# Patient Record
Sex: Female | Born: 1935
Health system: Southern US, Community
[De-identification: ages and names within clinical notes are randomized; demographics above are authoritative.]

## PROBLEM LIST (undated history)

## (undated) ENCOUNTER — Emergency Department (HOSPITAL_COMMUNITY): Payer: Medicare Other | Source: Home / Self Care

## (undated) DIAGNOSIS — R6510 Systemic inflammatory response syndrome (SIRS) of non-infectious origin without acute organ dysfunction: Secondary | ICD-10-CM

## (undated) DIAGNOSIS — R627 Adult failure to thrive: Secondary | ICD-10-CM

## (undated) DIAGNOSIS — R7303 Prediabetes: Secondary | ICD-10-CM

## (undated) DIAGNOSIS — E860 Dehydration: Secondary | ICD-10-CM

## (undated) DIAGNOSIS — E871 Hypo-osmolality and hyponatremia: Secondary | ICD-10-CM

## (undated) DIAGNOSIS — I10 Essential (primary) hypertension: Secondary | ICD-10-CM

## (undated) DIAGNOSIS — C4490 Unspecified malignant neoplasm of skin, unspecified: Secondary | ICD-10-CM

## (undated) DIAGNOSIS — N390 Urinary tract infection, site not specified: Secondary | ICD-10-CM

## (undated) DIAGNOSIS — J181 Lobar pneumonia, unspecified organism: Principal | ICD-10-CM

## (undated) HISTORY — DX: Urinary tract infection, site not specified: N39.0

## (undated) HISTORY — DX: Systemic inflammatory response syndrome (sirs) of non-infectious origin without acute organ dysfunction: R65.10

## (undated) HISTORY — PX: CHOLECYSTECTOMY: SHX55

## (undated) HISTORY — DX: Lobar pneumonia, unspecified organism: J18.1

## (undated) HISTORY — DX: Dehydration: E86.0

## (undated) HISTORY — DX: Hypo-osmolality and hyponatremia: E87.1

## (undated) HISTORY — DX: Adult failure to thrive: R62.7

---

## 2010-11-11 ENCOUNTER — Encounter (HOSPITAL_COMMUNITY): Payer: Self-pay

## 2010-11-11 ENCOUNTER — Emergency Department (HOSPITAL_COMMUNITY): Payer: Medicare Other

## 2010-11-11 ENCOUNTER — Emergency Department (HOSPITAL_COMMUNITY)
Admission: EM | Admit: 2010-11-11 | Discharge: 2010-11-11 | Disposition: A | Discharge status: Home / Self Care | Payer: Medicare Other | Attending: Emergency Medicine | Admitting: Emergency Medicine

## 2010-11-11 DIAGNOSIS — Z79899 Other long term (current) drug therapy: Secondary | ICD-10-CM | POA: Insufficient documentation

## 2010-11-11 DIAGNOSIS — B029 Zoster without complications: Secondary | ICD-10-CM | POA: Insufficient documentation

## 2010-11-11 DIAGNOSIS — R109 Unspecified abdominal pain: Secondary | ICD-10-CM | POA: Insufficient documentation

## 2010-11-11 DIAGNOSIS — N949 Unspecified condition associated with female genital organs and menstrual cycle: Secondary | ICD-10-CM | POA: Insufficient documentation

## 2010-11-11 DIAGNOSIS — I10 Essential (primary) hypertension: Secondary | ICD-10-CM | POA: Insufficient documentation

## 2010-11-11 DIAGNOSIS — Z9889 Other specified postprocedural states: Secondary | ICD-10-CM | POA: Insufficient documentation

## 2010-11-11 DIAGNOSIS — R079 Chest pain, unspecified: Secondary | ICD-10-CM | POA: Insufficient documentation

## 2010-11-11 DIAGNOSIS — R10819 Abdominal tenderness, unspecified site: Secondary | ICD-10-CM | POA: Insufficient documentation

## 2010-11-11 LAB — URINALYSIS, ROUTINE W REFLEX MICROSCOPIC
Urine Glucose, Fasting: NEGATIVE mg/dL
pH: 7 (ref 5.0–8.0)

## 2010-11-11 LAB — DIFFERENTIAL
Basophils Absolute: 0 10*3/uL (ref 0.0–0.1)
Basophils Relative: 1 % (ref 0–1)
Eosinophils Absolute: 0.1 10*3/uL (ref 0.0–0.7)
Eosinophils Relative: 1 % (ref 0–5)
Neutrophils Relative %: 68 % (ref 43–77)

## 2010-11-11 LAB — COMPREHENSIVE METABOLIC PANEL
AST: 46 U/L — ABNORMAL HIGH (ref 0–37)
Albumin: 4.3 g/dL (ref 3.5–5.2)
Alkaline Phosphatase: 100 U/L (ref 39–117)
BUN: 10 mg/dL (ref 6–23)
GFR calc Af Amer: >60 mL/min (ref >60)
Potassium: 3.3 mEq/L — ABNORMAL LOW (ref 3.5–5.1)
Sodium: 129 mEq/L — ABNORMAL LOW (ref 135–145)
Total Protein: 7.4 g/dL (ref 6.0–8.3)

## 2010-11-11 LAB — CBC
Platelets: 175 10*3/uL (ref 150–400)
RDW: 13 % (ref 11.5–15.5)
WBC: 5.6 10*3/uL (ref 4.0–10.5)

## 2010-11-11 LAB — URINE MICROSCOPIC-ADD ON

## 2010-11-11 MED ORDER — IOHEXOL 300 MG/ML  SOLN
100.0000 mL | Freq: Once | INTRAMUSCULAR | Status: AC | PRN
  Administered 2010-11-11: 100 mL via INTRAVENOUS

## 2016-07-06 ENCOUNTER — Other Ambulatory Visit: Payer: Self-pay | Admitting: Family Medicine

## 2016-07-06 DIAGNOSIS — R634 Abnormal weight loss: Secondary | ICD-10-CM

## 2016-07-29 ENCOUNTER — Ambulatory Visit
Admission: RE | Admit: 2016-07-29 | Discharge: 2016-07-29 | Disposition: A | Discharge status: Home / Self Care | Payer: Medicare Other | Source: Ambulatory Visit | Attending: Family Medicine | Admitting: Family Medicine

## 2016-07-29 DIAGNOSIS — R634 Abnormal weight loss: Secondary | ICD-10-CM

## 2016-08-18 ENCOUNTER — Emergency Department (HOSPITAL_COMMUNITY): Payer: Medicare Other

## 2016-08-18 ENCOUNTER — Inpatient Hospital Stay (HOSPITAL_COMMUNITY)
Admission: EM | Admit: 2016-08-18 | Discharge: 2016-08-21 | DRG: 871 | Disposition: A | Discharge status: Skilled Nursing Facility | Payer: Medicare Other | Attending: Internal Medicine | Admitting: Internal Medicine

## 2016-08-18 ENCOUNTER — Encounter (HOSPITAL_COMMUNITY): Payer: Self-pay | Admitting: *Deleted

## 2016-08-18 DIAGNOSIS — E860 Dehydration: Secondary | ICD-10-CM | POA: Diagnosis present

## 2016-08-18 DIAGNOSIS — B370 Candidal stomatitis: Secondary | ICD-10-CM | POA: Diagnosis present

## 2016-08-18 DIAGNOSIS — Z79899 Other long term (current) drug therapy: Secondary | ICD-10-CM | POA: Diagnosis not present

## 2016-08-18 DIAGNOSIS — R159 Full incontinence of feces: Secondary | ICD-10-CM | POA: Diagnosis present

## 2016-08-18 DIAGNOSIS — Z66 Do not resuscitate: Secondary | ICD-10-CM | POA: Diagnosis present

## 2016-08-18 DIAGNOSIS — E7439 Other disorders of intestinal carbohydrate absorption: Secondary | ICD-10-CM | POA: Diagnosis present

## 2016-08-18 DIAGNOSIS — R627 Adult failure to thrive: Secondary | ICD-10-CM | POA: Diagnosis present

## 2016-08-18 DIAGNOSIS — I10 Essential (primary) hypertension: Secondary | ICD-10-CM | POA: Diagnosis present

## 2016-08-18 DIAGNOSIS — R7302 Impaired glucose tolerance (oral): Secondary | ICD-10-CM

## 2016-08-18 DIAGNOSIS — R531 Weakness: Secondary | ICD-10-CM

## 2016-08-18 DIAGNOSIS — J181 Lobar pneumonia, unspecified organism: Secondary | ICD-10-CM | POA: Diagnosis present

## 2016-08-18 DIAGNOSIS — E876 Hypokalemia: Secondary | ICD-10-CM | POA: Diagnosis present

## 2016-08-18 DIAGNOSIS — R296 Repeated falls: Secondary | ICD-10-CM | POA: Diagnosis present

## 2016-08-18 DIAGNOSIS — K219 Gastro-esophageal reflux disease without esophagitis: Secondary | ICD-10-CM

## 2016-08-18 DIAGNOSIS — R634 Abnormal weight loss: Secondary | ICD-10-CM | POA: Diagnosis present

## 2016-08-18 DIAGNOSIS — W1830XA Fall on same level, unspecified, initial encounter: Secondary | ICD-10-CM | POA: Diagnosis present

## 2016-08-18 DIAGNOSIS — R946 Abnormal results of thyroid function studies: Secondary | ICD-10-CM

## 2016-08-18 DIAGNOSIS — R7303 Prediabetes: Secondary | ICD-10-CM | POA: Diagnosis present

## 2016-08-18 DIAGNOSIS — K228 Other specified diseases of esophagus: Secondary | ICD-10-CM | POA: Diagnosis present

## 2016-08-18 DIAGNOSIS — N390 Urinary tract infection, site not specified: Secondary | ICD-10-CM | POA: Diagnosis present

## 2016-08-18 DIAGNOSIS — R6510 Systemic inflammatory response syndrome (SIRS) of non-infectious origin without acute organ dysfunction: Secondary | ICD-10-CM | POA: Diagnosis present

## 2016-08-18 DIAGNOSIS — Y92129 Unspecified place in nursing home as the place of occurrence of the external cause: Secondary | ICD-10-CM | POA: Diagnosis not present

## 2016-08-18 DIAGNOSIS — A419 Sepsis, unspecified organism: Principal | ICD-10-CM

## 2016-08-18 DIAGNOSIS — R32 Unspecified urinary incontinence: Secondary | ICD-10-CM | POA: Diagnosis present

## 2016-08-18 DIAGNOSIS — E871 Hypo-osmolality and hyponatremia: Secondary | ICD-10-CM | POA: Diagnosis present

## 2016-08-18 DIAGNOSIS — D649 Anemia, unspecified: Secondary | ICD-10-CM | POA: Diagnosis present

## 2016-08-18 DIAGNOSIS — J9601 Acute respiratory failure with hypoxia: Secondary | ICD-10-CM | POA: Diagnosis present

## 2016-08-18 DIAGNOSIS — J189 Pneumonia, unspecified organism: Secondary | ICD-10-CM

## 2016-08-18 HISTORY — DX: Unspecified malignant neoplasm of skin, unspecified: C44.90

## 2016-08-18 HISTORY — DX: Essential (primary) hypertension: I10

## 2016-08-18 HISTORY — DX: Prediabetes: R73.03

## 2016-08-18 LAB — COMPREHENSIVE METABOLIC PANEL
ALBUMIN: 2.4 g/dL — AB (ref 3.5–5.0)
ALK PHOS: 127 U/L — AB (ref 38–126)
ALT: 21 U/L (ref 14–54)
ANION GAP: 9 (ref 5–15)
AST: 39 U/L (ref 15–41)
BILIRUBIN TOTAL: 0.9 mg/dL (ref 0.3–1.2)
BUN: 18 mg/dL (ref 6–20)
CALCIUM: 8.3 mg/dL — AB (ref 8.9–10.3)
CO2: 27 mmol/L (ref 22–32)
Chloride: 98 mmol/L — ABNORMAL LOW (ref 101–111)
Creatinine, Ser: 0.68 mg/dL (ref 0.44–1.00)
GFR calc Af Amer: >60 mL/min (ref >60)
GFR calc non Af Amer: >60 mL/min (ref >60)
GLUCOSE: 130 mg/dL — AB (ref 65–99)
POTASSIUM: 3.5 mmol/L (ref 3.5–5.1)
SODIUM: 134 mmol/L — AB (ref 135–145)
TOTAL PROTEIN: 6.2 g/dL — AB (ref 6.5–8.1)

## 2016-08-18 LAB — CBC WITH DIFFERENTIAL/PLATELET
BASOS ABS: 0 10*3/uL (ref 0.0–0.1)
BASOS PCT: 0 %
EOS ABS: 0 10*3/uL (ref 0.0–0.7)
Eosinophils Relative: 0 %
HEMATOCRIT: 35.1 % — AB (ref 36.0–46.0)
HEMOGLOBIN: 11 g/dL — AB (ref 12.0–15.0)
Lymphocytes Relative: 8 %
Lymphs Abs: 0.7 10*3/uL (ref 0.7–4.0)
MCH: 26 pg (ref 26.0–34.0)
MCHC: 31.3 g/dL (ref 30.0–36.0)
MCV: 83 fL (ref 78.0–100.0)
Monocytes Absolute: 0.6 10*3/uL (ref 0.1–1.0)
Monocytes Relative: 7 %
NEUTROS ABS: 7.2 10*3/uL (ref 1.7–7.7)
NEUTROS PCT: 85 %
Platelets: 229 10*3/uL (ref 150–400)
RBC: 4.23 MIL/uL (ref 3.87–5.11)
RDW: 15.2 % (ref 11.5–15.5)
WBC: 8.5 10*3/uL (ref 4.0–10.5)

## 2016-08-18 LAB — IRON AND TIBC
IRON: 7 ug/dL — AB (ref 28–170)
Saturation Ratios: 4 % — ABNORMAL LOW (ref 10.4–31.8)
TIBC: 172 ug/dL — AB (ref 250–450)
UIBC: 165 ug/dL

## 2016-08-18 LAB — URINALYSIS, ROUTINE W REFLEX MICROSCOPIC
GLUCOSE, UA: NEGATIVE mg/dL
Ketones, ur: NEGATIVE mg/dL
Nitrite: POSITIVE — AB
PH: 6 (ref 5.0–8.0)
Protein, ur: 30 mg/dL — AB
Specific Gravity, Urine: 1.022 (ref 1.005–1.030)

## 2016-08-18 LAB — CBC
HEMATOCRIT: 31.4 % — AB (ref 36.0–46.0)
HEMOGLOBIN: 10 g/dL — AB (ref 12.0–15.0)
MCH: 26.2 pg (ref 26.0–34.0)
MCHC: 31.8 g/dL (ref 30.0–36.0)
MCV: 82.2 fL (ref 78.0–100.0)
Platelets: 213 10*3/uL (ref 150–400)
RBC: 3.82 MIL/uL — ABNORMAL LOW (ref 3.87–5.11)
RDW: 15.2 % (ref 11.5–15.5)
WBC: 7.9 10*3/uL (ref 4.0–10.5)

## 2016-08-18 LAB — I-STAT TROPONIN, ED: Troponin i, poc: 0 ng/mL (ref 0.00–0.08)

## 2016-08-18 LAB — URINE MICROSCOPIC-ADD ON
RBC / HPF: NONE SEEN RBC/hpf (ref 0–5)
SQUAMOUS EPITHELIAL / LPF: NONE SEEN

## 2016-08-18 LAB — TSH: TSH: 0.282 u[IU]/mL — ABNORMAL LOW (ref 0.350–4.500)

## 2016-08-18 LAB — VITAMIN B12: Vitamin B-12: 415 pg/mL (ref 180–914)

## 2016-08-18 LAB — CREATININE, SERUM: Creatinine, Ser: 0.64 mg/dL (ref 0.44–1.00)

## 2016-08-18 LAB — FERRITIN: Ferritin: 132 ng/mL (ref 11–307)

## 2016-08-18 MED ORDER — SODIUM CHLORIDE 0.9 % IV BOLUS (SEPSIS)
500.0000 mL | Freq: Once | INTRAVENOUS | Status: AC
  Administered 2016-08-18: 500 mL via INTRAVENOUS

## 2016-08-18 MED ORDER — ADULT MULTIVITAMIN W/MINERALS CH
ORAL_TABLET | Freq: Every day | ORAL | Status: DC
  Administered 2016-08-19 – 2016-08-21 (×3): 1 via ORAL
  Filled 2016-08-18 (×6): qty 1

## 2016-08-18 MED ORDER — ONDANSETRON HCL 4 MG/2ML IJ SOLN: 4.0000 mg | Freq: Four times a day (QID) | INTRAMUSCULAR | Status: DC | PRN

## 2016-08-18 MED ORDER — AZITHROMYCIN 500 MG PO TABS
500.0000 mg | ORAL_TABLET | ORAL | Status: DC
  Administered 2016-08-19 – 2016-08-20 (×2): 500 mg via ORAL
  Filled 2016-08-18 (×3): qty 1

## 2016-08-18 MED ORDER — LOPERAMIDE HCL 2 MG PO CAPS: 2.0000 mg | ORAL_CAPSULE | Freq: Four times a day (QID) | ORAL | Status: DC | PRN

## 2016-08-18 MED ORDER — ACETAMINOPHEN 650 MG RE SUPP: 650.0000 mg | Freq: Four times a day (QID) | RECTAL | Status: DC | PRN

## 2016-08-18 MED ORDER — GOLD BOND EX POWD
1.0000 "application " | Freq: Every day | CUTANEOUS | Status: DC
  Administered 2016-08-19 – 2016-08-21 (×3): 1 via TOPICAL

## 2016-08-18 MED ORDER — ENSURE ENLIVE PO LIQD
237.0000 mL | Freq: Two times a day (BID) | ORAL | Status: DC
  Administered 2016-08-19 – 2016-08-20 (×4): 237 mL via ORAL

## 2016-08-18 MED ORDER — LEVOFLOXACIN IN D5W 500 MG/100ML IV SOLN
500.0000 mg | Freq: Once | INTRAVENOUS | Status: AC
  Administered 2016-08-18: 500 mg via INTRAVENOUS
  Filled 2016-08-18: qty 100

## 2016-08-18 MED ORDER — ACETAMINOPHEN 325 MG PO TABS
650.0000 mg | ORAL_TABLET | Freq: Once | ORAL | Status: AC
  Administered 2016-08-18: 650 mg via ORAL
  Filled 2016-08-18: qty 2

## 2016-08-18 MED ORDER — POTASSIUM CHLORIDE CRYS ER 10 MEQ PO TBCR
10.0000 meq | EXTENDED_RELEASE_TABLET | Freq: Every day | ORAL | Status: DC
  Administered 2016-08-18 – 2016-08-21 (×4): 10 meq via ORAL
  Filled 2016-08-18 (×5): qty 1

## 2016-08-18 MED ORDER — DEXTROSE 5 % IV SOLN
1.0000 g | INTRAVENOUS | Status: DC
  Administered 2016-08-18 – 2016-08-19 (×2): 1 g via INTRAVENOUS
  Filled 2016-08-18 (×3): qty 10

## 2016-08-18 MED ORDER — ONDANSETRON HCL 4 MG PO TABS: 4.0000 mg | ORAL_TABLET | Freq: Four times a day (QID) | ORAL | Status: DC | PRN

## 2016-08-18 MED ORDER — SENNOSIDES-DOCUSATE SODIUM 8.6-50 MG PO TABS: 1.0000 | ORAL_TABLET | Freq: Every evening | ORAL | Status: DC | PRN

## 2016-08-18 MED ORDER — ENOXAPARIN SODIUM 40 MG/0.4ML ~~LOC~~ SOLN
40.0000 mg | SUBCUTANEOUS | Status: DC
Start: 2016-08-18 — End: 2016-08-21
  Administered 2016-08-18 – 2016-08-20 (×3): 40 mg via SUBCUTANEOUS
  Filled 2016-08-18 (×3): qty 0.4

## 2016-08-18 MED ORDER — HYDRALAZINE HCL 25 MG PO TABS
25.0000 mg | ORAL_TABLET | Freq: Three times a day (TID) | ORAL | Status: DC
  Administered 2016-08-18 – 2016-08-19 (×2): 25 mg via ORAL
  Filled 2016-08-18 (×2): qty 1

## 2016-08-18 MED ORDER — ACETAMINOPHEN 325 MG PO TABS: 650.0000 mg | ORAL_TABLET | Freq: Four times a day (QID) | ORAL | Status: DC | PRN

## 2016-08-18 MED ORDER — SODIUM CHLORIDE 0.9 % IV SOLN
INTRAVENOUS | Status: AC
  Administered 2016-08-18: 21:00:00 via INTRAVENOUS

## 2016-08-18 NOTE — ED Triage Notes (Addendum)
Per EMS - patient from  [facility] after a witnessed fall from standing position this morning.  Patient denies LOC or hitting head.  Patient fell Monday as well and does not recall how she came to fall.  Patient has had increase in falls recently and has an appt with PCP tomorrow morning at 10 am to be evaluated for increased falls and decrease in mobility.  Patient has no complaints.  She denies dizziness or weakness prior to fall or currently.  Patient c/o being thirsty and initially refused transport, but her daughter wanted her to be brought to ER.  Vitals 152/80, HR 112, 96% on RA, CBG 154.

## 2016-08-18 NOTE — Progress Notes (Signed)
Cm consult for Pt interested in available home health and equipment. Please provide resources. Please speak with pt's daughter Emily Mills at [phone]. CM visited [facility] BUT daughter not present when Dr Clementeen Graham and Cm entered Pt states she went to get food for her and daughter had not eaten today Pt requested pain medication Cm informed ED RN, Magda Paganini Pt pointed to her dentures on bedside table and said she did not want to loose them  CM placed the container containing the pt dentures in a patient belonging bag and then on top of her clothes in another pt belonging bag to protect them. Cm informed ED RN, Mickel Baas about pt dentures and left information for daughter in pt belonging bag also for home health agencies, private duty nursing agencies choice connections and community health response program

## 2016-08-18 NOTE — Progress Notes (Signed)
   08/18/16 0000  CM Assessment  Expected Discharge [place]  In-house Referral NA  Discharge Planning Services CM Consult  [facility] Choice Durable Medical Equipment  Choice offered to / list presented to  Patient  DME Arranged Wheelchair manual  DME [place]  Status of Service Completed, signed off  Discharge Disposition Home w Home Health Services   Subjective/Objective:                  Daughter returned to the room at 1820 and Cm spoke with her after asking pt where she hurt and she informed CM "no where" Daughter states had been saying the same all day Pt given a banana by daughter  Daughter confirms pt from [facility] and had advanced home care services for HHPT that stopped on Monday August 16 2016 but interested in further services and a wheelchair States pt has a bedside commode, shower chair and walker but no w/c   Action/Plan: CM reviewed in details medicare guidelines, Choices of home health [facility]) (length of stay in home, types of [facility] staff available, coverage, primary caregiver, up to 24 hrs before services may be started) and choices of Private duty nursing (PDN-coverage, length of stay in the home types of staff available). CM reviewed availability of [facility] to assist pcp to get pt to snf (if desired disposition) from the community level. Discussed pt to be further evaluated by unit therapists (PT/OT) for recommendation of level of care and share this with attending MD and unit CM Assisted with cranberry juice  Pt IV beeping Updated ED RN Entered in wheelchair order

## 2016-08-18 NOTE — ED Notes (Signed)
Pt has a temp of 102.0 pt was given 650 mg of tylenol.

## 2016-08-18 NOTE — ED Notes (Signed)
Patient from assisted living with c/o increasing falls over past month.  Patient denies dizziness, SOB or pain.  She also denies N/V/D and fever.    Patient's lung sounds are CTAB, heart sounds, S1/S2 with +2 radial pulses, +1 dorsalis pedis pulses and bilateral +2 pitting edema. Abdomen soft and non-tender to palpation.

## 2016-08-18 NOTE — H&P (Addendum)
TRH H&P   Patient Demographics:    Emily Mills, is a 82 y.o. female  MRN: [id number]   DOB - 1931-02-09  Admit Date - 08/18/2016  Outpatient Primary MD for the patient is London Pepper, MD [facility] [place])  Referring MD: Dr. Marcie Bal  Outpatient Specialists: None  Patient coming from: Retirement home  Chief Complaint  Patient presents with  . Fall      HPI:    Emily Mills  is a 82 y.o. female, With history of hypertension and prediabetes resident of a retirement home brought to the ED for frequent falls. As per daughter at bedside patient has had at least 4 falls in the past 6 weeks at the retirement home. During one of those falls patient seems to have syncopized and does not remember the incident. No injury sustained. During these past 6 weeks patient has been noticed to be significantly declining with generalized weakness, poor by mouth intake along with urinary incontinence. Patient also had increased bilateral leg swellings formation was started on Lasix by her PCP and was increased. This morning she again fell while standing but without loss of consciousness or head injury. Patient had an appointment with her PCP for her increased falls. Patient denies any fevers, chills, headache, blurred vision, dizziness, nausea, vomiting, chest pain, shortness of breath, cough, abdominal pain, dysuria, diarrhea, pain or numbness in her extremities. Reports urinary incontinence for the past few weeks. Also has irregular bowel movements. Denies recent antibiotic use.  In the ED She was tachycardic and tachypnea. Was mildly hypertensive as well. O2 sat 90 dropping to 88% on room air. She was afebrile. Blood work showed WBC of 8.5, hemoglobin of 11. Chemistry showed sodium of 134, K of 3.5, chloride of 98, normal renal function and glucose. Chest x-ray showed left upper lobe  pneumonia versus contusion. This was followed by a CT chest without contrast which showed left upper lobe pneumonia. Head CT was unremarkable. UA suggestive of UTI. Patient given a dose of Levaquin and hospitalist admission requested to medical floor.     Review of systems:    In addition to the HPI above, No Fever-chills, No Headache, No changes with Vision or hearing, No problems swallowing food or Liquids, No Chest pain, Cough or Shortness of Breath, No Abdominal pain, No Nausea or Vomitting, Bowel movements irregular No Blood in stool or Urine, has urinary incontinence but no dysuria No new skin rashes or bruises, No new joints pains-aches,  No new weakness, tingling, numbness in any extremity, No recent weight gain or loss, No polyuria, polydypsia or polyphagia, No significant Mental Stressors.  A full 10 point Review of Systems was done, except as stated above, all other Review of Systems were negative.   With Past History of the following :    Past Medical History:  Diagnosis Date  . Hypertension   .  Pre-diabetes       Past Surgical History:  Procedure Laterality Date  . CHOLECYSTECTOMY     30 years ago      Social History:     Social History  Substance Use Topics  . Smoking status: Never Smoker  . Smokeless tobacco: Never Used  . Alcohol use No     Lives - Have retirement home  Mobility - independent earlier    Family History :   No family history of heart disease, diabetes or stroke   Home Medications:   Prior to Admission medications   Medication Sig Start Date End Date Taking? Authorizing Provider  amLODipine (NORVASC) 2.5 MG tablet Take 2.5 mg by mouth daily. 07/12/16  Yes Historical Provider, MD  furosemide (LASIX) 20 MG tablet Take 20 mg by mouth daily. 07/02/16  Yes Historical Provider, MD  loperamide (IMODIUM) 2 MG capsule Take 2 mg by mouth 4 (four) times daily as needed for diarrhea or loose stools.   Yes Historical Provider, MD    menthol-zinc oxide (GOLD BOND) powder Apply 1 application topically daily.   Yes Historical Provider, MD  Multiple Vitamins-Minerals (DAILY MULTI 50+ PO) Take 1 tablet by mouth daily.   Yes Historical Provider, MD  nystatin cream (MYCOSTATIN) Apply 1 application topically daily as needed for irritation or rash. 07/29/16  Yes Historical Provider, MD  potassium chloride (K-DUR) 10 MEQ tablet Take 10 mEq by mouth daily.   Yes Historical Provider, MD     Allergies:    No Known Allergies   Physical Exam:   Vitals  Blood pressure (!) 167/102, pulse 118, temperature 99.1 F (37.3 C), temperature source Oral, resp. rate 24, SpO2 96 %.   General: Elderly female lying in bed, appears fatigued, not in distress HEENT: No pallor, dry oral mucosa, supple neck Chest: Right basilar crackles, no rhonchi or wheeze CVS: S1 and S2 tachycardic, normal murmurs or gallop GI: Soft, nondistended, nontender, bowel sounds present Musculoskeletal: Warm, trace edema bilaterally, nontender CNS: Alert and oriented   Data Review:    CBC  Recent Labs Lab 08/18/16 1417  WBC 8.5  HGB 11.0*  HCT 35.1*  PLT 229  MCV 83.0  MCH 26.0  MCHC 31.3  RDW 15.2  LYMPHSABS 0.7  MONOABS 0.6  EOSABS 0.0  BASOSABS 0.0   ------------------------------------------------------------------------------------------------------------------  Chemistries   Recent Labs Lab 08/18/16 1417  NA 134*  K 3.5  CL 98*  CO2 27  GLUCOSE 130*  BUN 18  CREATININE 0.68  CALCIUM 8.3*  AST 39  ALT 21  ALKPHOS 127*  BILITOT 0.9   ------------------------------------------------------------------------------------------------------------------ CrCl cannot be calculated (Unknown ideal weight.). ------------------------------------------------------------------------------------------------------------------ No results for input(s): TSH, T4TOTAL, T3FREE, THYROIDAB in the last 72 hours.  Invalid input(s):  FREET3  Coagulation profile No results for input(s): INR, PROTIME in the last 168 hours. ------------------------------------------------------------------------------------------------------------------- No results for input(s): DDIMER in the last 72 hours. -------------------------------------------------------------------------------------------------------------------  Cardiac Enzymes No results for input(s): CKMB, TROPONINI, MYOGLOBIN in the last 168 hours.  Invalid input(s): CK ------------------------------------------------------------------------------------------------------------------ No results found for: BNP   ---------------------------------------------------------------------------------------------------------------  Urinalysis    Component Value Date/Time   COLORURINE AMBER (A) 08/18/2016 1417   APPEARANCEUR CLOUDY (A) 08/18/2016 1417   LABSPEC 1.022 08/18/2016 1417   PHURINE 6.0 08/18/2016 1417   GLUCOSEU NEGATIVE 08/18/2016 1417   HGBUR SMALL (A) 08/18/2016 1417   BILIRUBINUR SMALL (A) 08/18/2016 1417   KETONESUR NEGATIVE 08/18/2016 1417   PROTEINUR 30 (A) 08/18/2016 1417   UROBILINOGEN 0.2 11/11/2010 0400  NITRITE POSITIVE (A) 08/18/2016 1417   LEUKOCYTESUR LARGE (A) 08/18/2016 1417    ----------------------------------------------------------------------------------------------------------------   Imaging Results:    Dg Chest 2 View  Result Date: 08/18/2016 CLINICAL DATA:  History of a fall 2 days ago.  Chest pain. EXAM: CHEST  2 VIEW COMPARISON:  11/11/2010 FINDINGS: The cardiac silhouette, mediastinal and hilar contours are within normal limits. There is tortuosity and calcification of the thoracic aorta. Airspace consolidation noted in the left upper lobe and also in the left lower lobe. These could represent infiltrates, aspiration or pulmonary contusions. Underline emphysematous changes and pulmonary scarring. No definite pleural effusions. No  pneumothorax. The bony thorax is intact. No definite rib fractures are identified. IMPRESSION: Left upper lobe and left lower airspace opacities could reflect pneumonia/aspiration or pulmonary contusion. Followup PA and lateral chest X-ray is recommended in 3-4 weeks following trial of antibiotic therapy to ensure resolution and exclude underlying malignancy. Electronically Signed   By: Marijo Sanes M.D.   On: 08/18/2016 15:31   Ct Head Wo Contrast  Result Date: 08/18/2016 CLINICAL DATA:  Witnessed fall from standing position this morning. Increased falls and decreased mobility recently. Initial encounter. EXAM: CT HEAD WITHOUT CONTRAST TECHNIQUE: Contiguous axial images were obtained from the base of the skull through the vertex without intravenous contrast. COMPARISON:  None. FINDINGS: Brain: There is no evidence of acute cortical infarct, intracranial hemorrhage, mass, midline shift, or extra-axial fluid collection. Mild cerebral atrophy is within normal limits for age. Periventricular white matter hypodensities are nonspecific but compatible with mild chronic small vessel ischemic disease. There is a lacunar infarct along the lateral margin of the left thalamus, chronic in appearance. Vascular: Calcified atherosclerosis at the skullbase. No hyperdense vessel. Skull: Hyperostosis frontalis interna. No fracture or destructive osseous lesion. Sinuses/Orbits: Minimal anterior left ethmoid air cell mucosal thickening. Clear mastoid air cells. Visualized orbits are unremarkable. Other: None. IMPRESSION: 1. No evidence of acute intracranial abnormality. 2. Mild chronic small vessel ischemic disease. Electronically Signed   By: Logan Bores M.D.   On: 08/18/2016 16:02   Ct Chest Wo Contrast  Result Date: 08/18/2016 CLINICAL DATA:  Possible pneumonia.  Fall 2 days ago. EXAM: CT CHEST WITHOUT CONTRAST TECHNIQUE: Multidetector CT imaging of the chest was performed following the standard protocol without IV  contrast. COMPARISON:  Chest x-ray from earlier today FINDINGS: Cardiovascular: No cardiomegaly. Small anterior pericardial effusion. Extensive mitral annular calcification. Coronary atherosclerosis. Atherosclerotic calcification. Mediastinum/Nodes: Debris in the esophagus without detectable wall thickening. Negative for adenopathy. Goiter with dystrophic calcifications in the right lobe. Lungs/Pleura: Dense consolidation in the posterior segment left upper lobe correlating with chest x-ray findings. No evidence of airway debris. No pneumatocele, hemothorax, or pneumothorax. Trace left pleural effusion. Upper Abdomen: Hepatic steatosis chronic. Benign low-density lesion in the upper spleen, stable from 2012. Musculoskeletal: Spondylosis.  No acute or aggressive finding. IMPRESSION: 1. Segmental left upper lobe pneumonia. Followup PA and lateral chest X-ray is recommended 3 to 4 weeks following trial of antibiotic therapy to ensure resolution. 2. No posttraumatic finding. 3. Reflux or stasis in the esophagus. 4. Small anterior pericardial effusion. 5. Hepatic steatosis. Electronically Signed   By: Monte Fantasia M.D.   On: 08/18/2016 16:34    My personal review of EKG: Sinus tachycardia at 111, no ST-T changes   Assessment & Plan:    Active Problems:   SIRS secondary to Lobar pneumonia, unspecified organism ([place]) Admit to MedSurg. IV hydration with normal saline. Empiric IV Rocephin and azithromycin. Check blood cultures,  strep and Legionella urine antigen. Check HIV antibody. -Tylenol when necessary for fever.  UTI Follow urine culture. Continue empiric Rocephin. Likely contributing to her urinary incontinence.    Essential hypertension Blood pressure elevated. Will hold amlodipine and Lasix given bilateral leg swellings and dehydration respectively. I will place her on oral hydralazine.  Urinary incontinence Could be caused by UTI.Check renal ultrasound if symptoms persistent despite treated  with antibiotics.    Dehydration, mild Place on IV hydration. Hold Lasix    Hyponatremia Secondary to dehydration. IV fluids.     FTT (failure to thrive) in adult with frequent falls Continue hydration. Check orthostasis. Check TSH, B12 and cortisol level. PT and nutrition evaluation.     Anemia Check stool for Hemoccult, B12 and TSH.   DVT Prophylaxis Lovenox  AM Labs Ordered, also please review Full Orders  Family Communication: Admission, patients condition and plan of care including tests being ordered have been discussed with the patient and her daughter at bedside.  Code Status full code  Likely DC to  return to retirement home versus SNF  Condition fair  Consults called: None   Admission status: Inpatient    Time spent in minutes : 60   Louellen Molder M.D on 08/18/2016 at 5:47 PM  Between 7am to 7pm - Pager - [phone]. After 7pm go to [email or web address] - password [facility]  [facility] - Office  [phone]

## 2016-08-18 NOTE — Progress Notes (Signed)
Patient suffers from history of hypertension and prediabetes resident of a retirement home brought to the ED for frequent falls and generalized weakness. As per daughter at bedside patient has had at least 4 falls in the past 6 weeks at the retirement home. During one of those falls patient seems to have syncopized and does not remember the incident.which impairs their ability to perform daily activities like bathing, dressing and mobility in the home. This morning (08/18/16) she again fell while standing but without loss of consciousness or head injury A cane nor walker will not resolve  issue with performing activities of daily living. A wheelchair will allow patient to safely perform daily activities. Patient can safely propel the wheelchair in the home or has a caregiver who can provide assistance.  Accessories: elevating leg rests (ELRs), wheel locks, extensions and anti-tippers.

## 2016-08-18 NOTE — Progress Notes (Signed)
Cm notified Advanced home health coordinator and DME coordinator of need to be followed for d/c needs and request for w/c (text)

## 2016-08-18 NOTE — ED Notes (Signed)
Bed: WA06 Expected date:  Expected time:  Means of arrival:  Comments: EMS-fall 

## 2016-08-18 NOTE — ED Notes (Signed)
In and out cath performed at bedside with Beulah Gandy, EMT, assisting.

## 2016-08-18 NOTE — ED Provider Notes (Signed)
[facility] DEPT Provider Note   CSN: [id number] Arrival date & time: 08/18/16  1305     History   Chief Complaint Chief Complaint  Patient presents with  . Fall    HPI Emily Mills is a 82 y.o. female.  Patient has been weak and has had a couple falls recently. No loss of consciousness   The history is provided by the patient and a relative.  Fall  This is a recurrent problem. The current episode started 3 to 5 hours ago. The problem occurs every several days. The problem has been resolved. Pertinent negatives include no chest pain, no abdominal pain and no headaches. Nothing aggravates the symptoms. Nothing relieves the symptoms.    Past Medical History:  Diagnosis Date  . Hypertension   . Pre-diabetes     Patient Active Problem List   Diagnosis Date Noted  . Lobar pneumonia, unspecified organism ([place]) 08/18/2016    Past Surgical History:  Procedure Laterality Date  . CHOLECYSTECTOMY     30 years ago    OB History    No data available       Home Medications    Prior to Admission medications   Medication Sig Start Date End Date Taking? Authorizing Provider  amLODipine (NORVASC) 2.5 MG tablet Take 2.5 mg by mouth daily. 07/12/16  Yes Historical Provider, MD  furosemide (LASIX) 20 MG tablet Take 20 mg by mouth daily. 07/02/16  Yes Historical Provider, MD  loperamide (IMODIUM) 2 MG capsule Take 2 mg by mouth 4 (four) times daily as needed for diarrhea or loose stools.   Yes Historical Provider, MD  menthol-zinc oxide (GOLD BOND) powder Apply 1 application topically daily.   Yes Historical Provider, MD  Multiple Vitamins-Minerals (DAILY MULTI 50+ PO) Take 1 tablet by mouth daily.   Yes Historical Provider, MD  nystatin cream (MYCOSTATIN) Apply 1 application topically daily as needed for irritation or rash. 07/29/16  Yes Historical Provider, MD  potassium chloride (K-DUR) 10 MEQ tablet Take 10 mEq by mouth daily.   Yes Historical Provider, MD    Family  History No family history on file.  Social History Social History  Substance Use Topics  . Smoking status: Never Smoker  . Smokeless tobacco: Never Used  . Alcohol use No     Allergies   Patient has no known allergies.   Review of Systems Review of Systems  Constitutional: Negative for appetite change and fatigue.  HENT: Negative for congestion, ear discharge and sinus pressure.   Eyes: Negative for discharge.  Respiratory: Negative for cough.   Cardiovascular: Negative for chest pain.  Gastrointestinal: Negative for abdominal pain and diarrhea.  Genitourinary: Negative for frequency and hematuria.  Musculoskeletal: Negative for back pain.  Skin: Negative for rash.  Neurological: Negative for seizures and headaches.  Psychiatric/Behavioral: Negative for hallucinations.     Physical Exam Updated Vital Signs BP 136/76   Pulse 101   Temp 99.1 F (37.3 C) (Oral)   Resp (!) 27   SpO2 93%   Physical Exam  Constitutional: She is oriented to person, place, and time. She appears well-developed.  HENT:  Head: Normocephalic.  Eyes: Conjunctivae and EOM are normal. No scleral icterus.  Neck: Neck supple. No thyromegaly present.  Cardiovascular: Regular rhythm.  Exam reveals no gallop and no friction rub.   No murmur heard. Tachycardia  Pulmonary/Chest: No stridor. She has no wheezes. She has no rales. She exhibits no tenderness.  Abdominal: She exhibits no distension. There is no  tenderness. There is no rebound.  Musculoskeletal: Normal range of motion. She exhibits no edema.  Lymphadenopathy:    She has no cervical adenopathy.  Neurological: She is oriented to person, place, and time. She exhibits normal muscle tone. Coordination normal.  Skin: No rash noted. No erythema.  Psychiatric: She has a normal mood and affect. Her behavior is normal.     ED Treatments / Results  Labs (all labs ordered are listed, but only abnormal results are displayed) Labs Reviewed    CBC WITH DIFFERENTIAL/PLATELET - Abnormal; Notable for the following:       Result Value   Hemoglobin 11.0 (*)    HCT 35.1 (*)    All other components within normal limits  COMPREHENSIVE METABOLIC PANEL - Abnormal; Notable for the following:    Sodium 134 (*)    Chloride 98 (*)    Glucose, Bld 130 (*)    Calcium 8.3 (*)    Total Protein 6.2 (*)    Albumin 2.4 (*)    Alkaline Phosphatase 127 (*)    All other components within normal limits  URINALYSIS, ROUTINE W REFLEX MICROSCOPIC (NOT AT [facility]) - Abnormal; Notable for the following:    Color, Urine AMBER (*)    APPearance CLOUDY (*)    Hgb urine dipstick SMALL (*)    Bilirubin Urine SMALL (*)    Protein, ur 30 (*)    Nitrite POSITIVE (*)    Leukocytes, UA LARGE (*)    All other components within normal limits  URINE MICROSCOPIC-ADD ON - Abnormal; Notable for the following:    Bacteria, UA MANY (*)    All other components within normal limits  URINE CULTURE  I-STAT TROPOININ, ED    EKG  EKG Interpretation  Date/Time:  Wednesday August 18 2016 13:29:57 EST Ventricular Rate:  111 PR Interval:    QRS Duration: 84 QT Interval:  321 QTC Calculation: 437 R Axis:   61 Text Interpretation:  Sinus tachycardia Consider left atrial enlargement Confirmed by Maralyn Witherell  MD, Broadus John [phone]) on 08/18/2016 4:43:23 PM       Radiology Dg Chest 2 View  Result Date: 08/18/2016 CLINICAL DATA:  History of a fall 2 days ago.  Chest pain. EXAM: CHEST  2 VIEW COMPARISON:  11/11/2010 FINDINGS: The cardiac silhouette, mediastinal and hilar contours are within normal limits. There is tortuosity and calcification of the thoracic aorta. Airspace consolidation noted in the left upper lobe and also in the left lower lobe. These could represent infiltrates, aspiration or pulmonary contusions. Underline emphysematous changes and pulmonary scarring. No definite pleural effusions. No pneumothorax. The bony thorax is intact. No definite rib fractures are  identified. IMPRESSION: Left upper lobe and left lower airspace opacities could reflect pneumonia/aspiration or pulmonary contusion. Followup PA and lateral chest X-ray is recommended in 3-4 weeks following trial of antibiotic therapy to ensure resolution and exclude underlying malignancy. Electronically Signed   By: Marijo Sanes M.D.   On: 08/18/2016 15:31   Ct Head Wo Contrast  Result Date: 08/18/2016 CLINICAL DATA:  Witnessed fall from standing position this morning. Increased falls and decreased mobility recently. Initial encounter. EXAM: CT HEAD WITHOUT CONTRAST TECHNIQUE: Contiguous axial images were obtained from the base of the skull through the vertex without intravenous contrast. COMPARISON:  None. FINDINGS: Brain: There is no evidence of acute cortical infarct, intracranial hemorrhage, mass, midline shift, or extra-axial fluid collection. Mild cerebral atrophy is within normal limits for age. Periventricular white matter hypodensities are nonspecific but compatible  with mild chronic small vessel ischemic disease. There is a lacunar infarct along the lateral margin of the left thalamus, chronic in appearance. Vascular: Calcified atherosclerosis at the skullbase. No hyperdense vessel. Skull: Hyperostosis frontalis interna. No fracture or destructive osseous lesion. Sinuses/Orbits: Minimal anterior left ethmoid air cell mucosal thickening. Clear mastoid air cells. Visualized orbits are unremarkable. Other: None. IMPRESSION: 1. No evidence of acute intracranial abnormality. 2. Mild chronic small vessel ischemic disease. Electronically Signed   By: Logan Bores M.D.   On: 08/18/2016 16:02   Ct Chest Wo Contrast  Result Date: 08/18/2016 CLINICAL DATA:  Possible pneumonia.  Fall 2 days ago. EXAM: CT CHEST WITHOUT CONTRAST TECHNIQUE: Multidetector CT imaging of the chest was performed following the standard protocol without IV contrast. COMPARISON:  Chest x-ray from earlier today FINDINGS:  Cardiovascular: No cardiomegaly. Small anterior pericardial effusion. Extensive mitral annular calcification. Coronary atherosclerosis. Atherosclerotic calcification. Mediastinum/Nodes: Debris in the esophagus without detectable wall thickening. Negative for adenopathy. Goiter with dystrophic calcifications in the right lobe. Lungs/Pleura: Dense consolidation in the posterior segment left upper lobe correlating with chest x-ray findings. No evidence of airway debris. No pneumatocele, hemothorax, or pneumothorax. Trace left pleural effusion. Upper Abdomen: Hepatic steatosis chronic. Benign low-density lesion in the upper spleen, stable from 2012. Musculoskeletal: Spondylosis.  No acute or aggressive finding. IMPRESSION: 1. Segmental left upper lobe pneumonia. Followup PA and lateral chest X-ray is recommended 3 to 4 weeks following trial of antibiotic therapy to ensure resolution. 2. No posttraumatic finding. 3. Reflux or stasis in the esophagus. 4. Small anterior pericardial effusion. 5. Hepatic steatosis. Electronically Signed   By: Monte Fantasia M.D.   On: 08/18/2016 16:34    Procedures Procedures (including critical care time)  Medications Ordered in ED Medications  levofloxacin (LEVAQUIN) IVPB 500 mg (not administered)  sodium chloride 0.9 % bolus 500 mL (0 mLs Intravenous Stopped 08/18/16 1454)     Initial Impression / Assessment and Plan / ED Course  I have reviewed the triage vital signs and the nursing notes.  Pertinent labs & imaging results that were available during my care of the patient were reviewed by me and considered in my medical decision making (see chart for details).  Clinical Course     Patient with urinary tract infection and pneumonia. She will be admitted for IV antibiotics  Final Clinical Impressions(s) / ED Diagnoses   Final diagnoses:  None    New Prescriptions New Prescriptions   No medications on file     Milton Ferguson, MD 08/18/16 [phone]

## 2016-08-19 ENCOUNTER — Inpatient Hospital Stay (HOSPITAL_COMMUNITY): Payer: Medicare Other

## 2016-08-19 DIAGNOSIS — A419 Sepsis, unspecified organism: Principal | ICD-10-CM

## 2016-08-19 LAB — STREP PNEUMONIAE URINARY ANTIGEN: Strep Pneumo Urinary Antigen: NEGATIVE

## 2016-08-19 LAB — CBC
HCT: 31.6 % — ABNORMAL LOW (ref 36.0–46.0)
Hemoglobin: 9.8 g/dL — ABNORMAL LOW (ref 12.0–15.0)
MCH: 25.7 pg — ABNORMAL LOW (ref 26.0–34.0)
MCHC: 31 g/dL (ref 30.0–36.0)
MCV: 82.9 fL (ref 78.0–100.0)
Platelets: 191 K/uL (ref 150–400)
RBC: 3.81 MIL/uL — ABNORMAL LOW (ref 3.87–5.11)
RDW: 15.2 % (ref 11.5–15.5)
WBC: 6.3 K/uL (ref 4.0–10.5)

## 2016-08-19 LAB — BASIC METABOLIC PANEL WITH GFR
Anion gap: 6 (ref 5–15)
BUN: 12 mg/dL (ref 6–20)
CO2: 27 mmol/L (ref 22–32)
Calcium: 7.8 mg/dL — ABNORMAL LOW (ref 8.9–10.3)
Chloride: 102 mmol/L (ref 101–111)
Creatinine, Ser: 0.55 mg/dL (ref 0.44–1.00)
GFR calc Af Amer: >60 mL/min (ref >60)
GFR calc non Af Amer: >60 mL/min (ref >60)
Glucose, Bld: 147 mg/dL — ABNORMAL HIGH (ref 65–99)
Potassium: 3.4 mmol/L — ABNORMAL LOW (ref 3.5–5.1)
Sodium: 135 mmol/L (ref 135–145)

## 2016-08-19 LAB — MRSA PCR SCREENING: MRSA by PCR: NEGATIVE

## 2016-08-19 LAB — HIV ANTIBODY (ROUTINE TESTING W REFLEX): HIV Screen 4th Generation wRfx: NONREACTIVE

## 2016-08-19 MED ORDER — FAMOTIDINE 20 MG PO TABS
20.0000 mg | ORAL_TABLET | Freq: Every day | ORAL | Status: DC
  Administered 2016-08-19 – 2016-08-21 (×3): 20 mg via ORAL
  Filled 2016-08-19 (×3): qty 1

## 2016-08-19 MED ORDER — VITAMINS A & D EX OINT
TOPICAL_OINTMENT | CUTANEOUS | Status: AC
  Filled 2016-08-19: qty 5

## 2016-08-19 MED ORDER — LIP MEDEX EX OINT
TOPICAL_OINTMENT | CUTANEOUS | Status: AC
  Administered 2016-08-19: 01:00:00
  Filled 2016-08-19: qty 7

## 2016-08-19 MED ORDER — POTASSIUM CHLORIDE CRYS ER 20 MEQ PO TBCR
40.0000 meq | EXTENDED_RELEASE_TABLET | Freq: Once | ORAL | Status: AC
  Administered 2016-08-19: 40 meq via ORAL
  Filled 2016-08-19: qty 2

## 2016-08-19 NOTE — Care Management Note (Addendum)
Case Management Note  Patient Details  Name: Cherity Massenburg MRN: [id number] Date of Birth: [birth year]  Subjective/Objective:                  Fall Action/Plan: Discharge planning Expected Discharge Date:   (unknown)               Expected Discharge Plan:  [facility]  In-House Referral:  NA  Discharge planning Services  CM Consult  Post Acute Care Choice:  Durable Medical Equipment Choice offered to:  Patient  DME Arranged:  N/A DME Agency:  NA  HH Arranged:  NA HH Agency:  NA  Status of Service:  Completed, signed off  If discussed at [place] of Stay Meetings, dates discussed:    Additional Comments: CM spoke with pt, daughter Otila Kluver, MD and CSW and plan is for SNF.  Daughter has spoken with PCP and requesting [facility], [facility], or [facility] as preference; CSW aware and arranging.  CM gave pt adv Directive booklet and called Jovita Kussmaul for notary, and witness.  NO other CM needs were communicated. Dellie Catholic, RN 08/19/2016, 12:19 PM

## 2016-08-19 NOTE — Evaluation (Addendum)
Physical Therapy Evaluation Patient Details Name: Emily Mills MRN: [id number] DOB: 1931/02/26 Today's Date: 08/19/2016   History of Present Illness  Emily Mills  is a 82 y.o. female, With history of hypertension and prediabetes resident of a retirement home brought to the ED for frequent falls. As per daughter at bedside patient has had at least 4 falls in the past 6 weeks at the retirement home. no fractures found.  Clinical Impression  The patient is very pleasant and able to ambulate with  RW and 1 assist. The patient will benefit from post acute rehab as she lives alone in retirement community and daughter works, per patient. Pt admitted with above diagnosis. Pt currently with functional limitations due to the deficits listed below (see PT Problem List).  Pt will benefit from skilled PT to increase their independence and safety with mobility to allow discharge to the venue listed below.  '     Follow Up Recommendations SNF;Supervision/Assistance - 24 hour    Equipment Recommendations  None recommended by PT    Recommendations for Other Services       Precautions / Restrictions Precautions Precautions: Fall  Incontinent of Band B, use briefs or     Mobility  Bed Mobility Overal bed mobility: Needs Assistance Bed Mobility: Supine to Sit     Supine to sit: Mod assist     General bed mobility comments: assist with legs and trunk  Transfers Overall transfer level: Needs assistance Equipment used: 4-wheeled walker Transfers: Sit to/from Stand Sit to Stand: Mod assist         General transfer comment: Asist to steady after standing up with tendendency to lean backwards. gradually improved but required min assist  Ambulation/Gait Ambulation/Gait assistance: Min assist Ambulation Distance (Feet): 70 Feet Assistive device: 4-wheeled walker Gait Pattern/deviations: Step-through pattern;Trunk flexed     General Gait Details: slow speed with 4 wheeled RW.  minassist for balance. Turns around, backs up to recliner with cues.  Stairs            Wheelchair Mobility    Modified Rankin (Stroke Patients Only)       Balance Overall balance assessment: Needs assistance;History of Falls Sitting-balance support: Bilateral upper extremity supported;Feet supported Sitting balance-Leahy Scale: Poor Sitting balance - Comments: initially  patient was swaying in all directions and especially backward. Gradually improved and able to support herself at midline. Postural control: Posterior lean Standing balance support: During functional activity;Bilateral upper extremity supported Standing balance-Leahy Scale: Poor                               Pertinent Vitals/Pain Pain Assessment: No/denies pain    Home Living Family/patient expects to be discharged to:: Private residence Living Arrangements: Alone Available Help at Discharge: Family;Available PRN/intermittently Type of Home: Independent living facility Home Access: Level entry     Home Layout: One level Home Equipment: Walker - 4 wheels      Prior Function Level of Independence: Independent with assistive device(s)               Hand Dominance        Extremity/Trunk Assessment   Upper Extremity Assessment: Generalized weakness           Lower Extremity Assessment: Generalized weakness      Cervical / Trunk Assessment: Kyphotic  Communication   Communication: No difficulties  Cognition Arousal/Alertness: Awake/alert Behavior During Therapy: WFL for tasks assessed/performed Overall Cognitive Status:  Within Functional Limits for tasks assessed                      General Comments      Exercises     Assessment/Plan    PT Assessment Patient needs continued PT services  PT Problem List Decreased strength;Decreased activity tolerance;Decreased balance;Decreased mobility;Decreased coordination;Decreased knowledge of  precautions;Decreased safety awareness;Decreased knowledge of use of DME          PT Treatment Interventions DME instruction;Gait training;Functional mobility training;Therapeutic activities;Therapeutic exercise;Balance training;Patient/family education    PT Goals (Current goals can be found in the Care Plan section)  Acute Rehab PT Goals Patient Stated Goal: wants to walk PT Goal Formulation: With patient Time For Goal Achievement: 09/02/16 Potential to Achieve Goals: Good    Frequency Min 3X/week   Barriers to discharge Decreased caregiver support      Co-evaluation               End of Session Equipment Utilized During Treatment: Gait belt Activity Tolerance: Patient tolerated treatment well Patient left: in chair;with call bell/phone within reach;with chair alarm set Nurse Communication: Mobility status         Time: 1125-1150 PT Time Calculation (min) (ACUTE ONLY): 25 min   Charges:   PT Evaluation $PT Eval Low Complexity: 1 Procedure PT Treatments $Gait Training: 8-22 mins   PT G Codes:        Claretha Cooper 08/19/2016, 1:54 PM Tresa Endo PT [phone]

## 2016-08-19 NOTE — Consult Note (Signed)
[facility] Nurse wound consult note Reason for Consult: assess for pressure injury to sacrum, none found Wound type: Moisture Pressure Ulcer POA: No Measurement: erythema and maceration in the perineal area and in the area of the pubis secondary to wet pads being held in place with mesh briefs Wound [id number] Drainage (amount, consistency, odor) none Periwound:none Dressing procedure/placement/frequency: Patient resides in an assisted or independent  living facility and manages her urinary incontinence with pads/tight fitting briefs. It is likely that her change frequency is inadequate and that her personal care and hygiene less than sufficient following episodes of urinary incontinence.  We will remove the system she is currently using, implement a low air loss mattress for a few days, turn and reposition off of the supine position, and cleanse using our house incontinence cleanser followed by a moisture barrier ointment. This should resolve the problem efficiently.  Additionally, we ill provide a pressure redistribution chair cushion for her use when OOB in chair both here and following discharge.  It is wetness resistant. [facility] nursing team will not follow, but will remain available to this patient, the nursing and medical teams.  Please re-consult if needed. Thanks, Maudie Flakes, MSN, RN, [place], Arther Abbott  Pager# [phone]

## 2016-08-19 NOTE — Progress Notes (Signed)
ED CM spoke again with Larene Beach of [facility] area about pt requesting DME and discussed pt now in [place]

## 2016-08-19 NOTE — NC FL2 (Signed)
[place] LEVEL OF CARE SCREENING TOOL     IDENTIFICATION  Patient Name: Emily Mills Birthdate: 09/20/1931 Sex: female Admission Date (Current Location): 08/18/2016  [facility] and [place] Number:  Herbalist and Address:  [facility],  [place] [address] [place]      Provider Number: [id number]  Attending Physician Name and Address:  Florencia Reasons, MD  Relative Name and Phone Number:       Current Level of Care: Hospital Recommended Level of Care: [place] Prior Approval Number:    Date Approved/Denied:   PASRR Number: [id number]  Discharge Plan: SNF    Current Diagnoses: Patient Active Problem List   Diagnosis Date Noted  . Lobar pneumonia, unspecified organism ([place]) 08/18/2016  . Essential hypertension 08/18/2016  . Dehydration, mild 08/18/2016  . Hyponatremia 08/18/2016  . UTI (urinary tract infection) 08/18/2016  . FTT (failure to thrive) in adult 08/18/2016  . Frequent falls 08/18/2016  . Prediabetes 08/18/2016  . SIRS (systemic inflammatory response syndrome) ([facility]) 08/18/2016    Orientation RESPIRATION BLADDER Height & Weight     Self, Time, Situation, Place  Normal Incontinent Weight: 153 lb (69.4 kg) Height:  5\' 1"  (154.9 cm)  BEHAVIORAL SYMPTOMS/MOOD NEUROLOGICAL BOWEL NUTRITION STATUS  Other (Comment) (no behaviors)   Incontinent Diet  AMBULATORY STATUS COMMUNICATION OF NEEDS Skin   Limited Assist Verbally Normal                       Personal Care Assistance Level of Assistance  Bathing, Feeding, Dressing Bathing Assistance: Limited assistance Feeding assistance: Independent Dressing Assistance: Limited assistance     Functional Limitations Info  Sight, Hearing, Speech Sight Info: Adequate Hearing Info: Adequate Speech Info: Adequate    SPECIAL CARE FACTORS FREQUENCY  PT (By licensed PT), OT (By licensed OT)     PT Frequency: 5x wk OT Frequency: 5x wk             Contractures Contractures Info: Not present    Additional Factors Info  Code Status Code Status Info: DNR             Current Medications (08/19/2016):  This is the current hospital active medication list Current Facility-Administered Medications  Medication Dose Route Frequency Provider Last Rate Last Dose  . 0.9 %  sodium chloride infusion   Intravenous Continuous Nishant Dhungel, MD 75 mL/hr at 08/18/16 2055    . acetaminophen (TYLENOL) tablet 650 mg  650 mg Oral Q6H PRN Nishant Dhungel, MD       Or  . acetaminophen (TYLENOL) suppository 650 mg  650 mg Rectal Q6H PRN Nishant Dhungel, MD      . azithromycin (ZITHROMAX) tablet 500 mg  500 mg Oral Q24H Nishant Dhungel, MD      . cefTRIAXone (ROCEPHIN) 1 g in dextrose 5 % 50 mL IVPB  1 g Intravenous Q24H Nishant Dhungel, MD   1 g at 08/18/16 2113  . enoxaparin (LOVENOX) injection 40 mg  40 mg Subcutaneous Q24H Nishant Dhungel, MD   40 mg at 08/18/16 2246  . famotidine (PEPCID) tablet 20 mg  20 mg Oral Daily Florencia Reasons, MD   20 mg at 08/19/16 1318  . feeding supplement (ENSURE ENLIVE) (ENSURE ENLIVE) liquid 237 mL  237 mL Oral BID BM Florencia Reasons, MD   237 mL at 08/19/16 0952  . menthol-zinc oxide (GOLD BOND) powder 1 application  1 application Topical Daily Nishant Dhungel, MD      .  multivitamin with minerals tablet   Oral Daily Nishant Dhungel, MD   1 tablet at 08/19/16 0947  . ondansetron (ZOFRAN) tablet 4 mg  4 mg Oral Q6H PRN Nishant Dhungel, MD       Or  . ondansetron (ZOFRAN) injection 4 mg  4 mg Intravenous Q6H PRN Nishant Dhungel, MD      . potassium chloride (K-DUR,KLOR-CON) CR tablet 10 mEq  10 mEq Oral Daily Nishant Dhungel, MD   10 mEq at 08/19/16 0947  . senna-docusate (Senokot-S) tablet 1 tablet  1 tablet Oral QHS PRN Louellen Molder, MD         Discharge Medications: Please see discharge summary for a list of discharge medications.  Relevant Imaging Results:  Relevant Lab Results:   Additional Information SS #  [id number]  Haidinger, Randall An, LCSW

## 2016-08-19 NOTE — Progress Notes (Signed)
Initial Nutrition Assessment  DOCUMENTATION CODES:   Not applicable  INTERVENTION:   Ensure Enlive po BID, each supplement provides 350 kcal and 20 grams of protein  NUTRITION DIAGNOSIS:   Unintentional weight loss related to social / environmental circumstances, chronic illness as evidenced by per patient/family report, mild depletion of muscle mass.   GOAL:   Patient will meet greater than or equal to 90% of their needs  MONITOR:   PO intake, Supplement acceptance  REASON FOR ASSESSMENT:   Consult Assessment of nutrition requirement/status  ASSESSMENT:   82 y.o. female, With history of hypertension and prediabetes resident of a retirement home brought to the ED for frequent falls. Admitted with PNA, SIRS, UTI, FTT  Met with patient in room today. Patient reports that she is feeling much better today. Patient has good appetite today and eating 80% meals. Patient reports that she has not been eating much pta r/t the food that they are serving in her retirement community. Patient reports that her retirement community serves a lot of pork and that she is Jewish and does not eat pork so she will just eat a potato instead. Spoke to patient about getting some Ensure to keep in her room and drinking those when pork is served. Patient reports that she has lost 30lbs over the past eight months. There is no wt history in  chart so unsure is this is an accurate amount. Patient has some mild muscle loss but overall seems to be well nourished. Patient needs dentures to chew food. Daughter is supposed to be bringing by dentures later today.   Medications reviewed and include: azithromycin, ceftriaxone, lovenox, MVI, KCl   Labs reviewed: K 3.4(L), Ca 7.8(L) AlkPhos 127(H)-11/29, Alb 2.4(L)-11/29 Iron 7(L), TIBC 172(L), Ferritin 132 wnl BG-130, 147 x 24hrs  Nutrition-Focused physical exam completed. Findings are no fat depletion, mild muscle depletion, and no edema.   Diet Order:  Diet 2  gram sodium Room service appropriate? Yes; Fluid consistency: Thin  Skin:  Reviewed, no issues  Last BM:  11/28  Height:   Ht Readings from Last 1 Encounters:  08/18/16 _0  (1.549 m)    Weight:   Wt Readings from Last 1 Encounters:  08/18/16 153 lb (69.4 kg)    Ideal Body Weight:  47.7 kg  BMI:  Body mass index is 28.91 kg/m.  Estimated Nutritional Needs:   Kcal:  1200-1450kcal/day   Protein:  57-67g/day  Fluid:  >1.2L/day   EDUCATION NEEDS:   No education needs identified at this time  Koleen Distance, RD, LDN

## 2016-08-19 NOTE — Progress Notes (Signed)
Spiritual Care Note    08/19/16 1600  Clinical Encounter Type  Visited With Patient and family together  Visit Type (Advance Directives)  Advance Directives (For Healthcare)  Does Patient Have a Medical Advance Directive? Yes   Referred by case manager for assistance with Advance Directives.  Reviewed forms with Ms Casali and her daughter Otila Kluver.  Pt initialed her preferences.  Joe/AC notarized, with two volunteers as witnesses.  Copy placed in pt's paper chart for filing with HIM.  Original and three copies returned to pt.  Pt and family appreciative.  Consulted with Taylor/RN re Tina's other questions about care plan.  [place], MDiv, [facility] 24/7 pager [phone]

## 2016-08-19 NOTE — Progress Notes (Signed)
PROGRESS NOTE  Emily Mills [id number] DOB: 1931/05/23 DOA: 08/18/2016 PCP: Hulen Shouts, MD  HPI/Recap of past 24 hours:  Fever subsided Daughter in room  Assessment/Plan: Active Problems:   Lobar pneumonia, unspecified organism ([place])   Essential hypertension   Dehydration, mild   Hyponatremia   UTI (urinary tract infection)   FTT (failure to thrive) in adult   Frequent falls   Prediabetes   SIRS (systemic inflammatory response syndrome) ([place])  Sepsis presented on admission with fever 102 , sinus tachycardia heart rate 116, tachypnea rr 34, acute hypoxic respiratory failure with left upper lobe pna and uti No leukocytosis, lactic acid was not checked Blood culture /urine culture pending, sputum sample was not sent Better on ivf and iv abx  Acute hypoxic respiratory failure /Left upper lobe pna: o2 sats dropped to 88% on room air in the ED CT chest : Segmental left upper lobe pneumonia. Followup PA and lateral chest X-ray is recommended 3 to 4 weeks following trial of antibiotic therapy to ensure resolution. Better, now on room air at rest, respiration rate better Continue abx, speech eval, will need to ambulate check o2 sats,   UTI:  Cr wnl, urine culture pending, on abx  Normocytic anemia: per daughter patient has a long history of iron deficiency hgb 11-10-9.8 b12 wnl, folate, retic count pending, Anemia panel showed iron deficiency, will avoid iv iron in the setting of acute infection, will start oral iron supplement at discharge   HTN; bp stable, bp meds held in the setting of sepsis Likely able to resume at discharge  Reflux or stasis in the esophagus. Start pepcid DG esophagus pending  Impaired fasting blood sugar, check a1c  Hypokalemia/hyponatremia Lasix held since admission Na normalized with ivf, k replaced, check mag  suppressed tsh:  check free t4  erythema and maceration in the perineal area and in the area of the pubis secondary  to wet pads being held in place with mesh briefs woc input appreciated " implement a low air loss mattress for a few days, turn and reposition off of the supine position, and cleanse using our house incontinence cleanser followed by a moisture barrier ointment. This should resolve the problem efficiently." "  Additionally, we ill provide a pressure redistribution chair cushion for her use when OOB in chair both here and following discharge. It is wetness resistant"    FTT, weight loss (Body mass index is 28.91 kg/m.) , frequent falls, daughter reported patient having incontinence, progressive lower extremity weakness CT head no acute findings, will get mri lumber spine,  will check orthostatic vital signs,  Tele with sinus tachycardia, few pac's Nutrition/PT/OT, may need SNF  Code Status: DNR , confirmed with daughter  Family Communication: patient and daughter at bedside  Disposition Plan:  snf   Consultants:  none  Procedures:  none  Antibiotics:  Rocephin/zithro   Objective: BP 111/82 (BP Location: Left Arm)   Pulse 83   Temp 97.4 F (36.3 C) (Oral)   Resp 16   Ht 5\' 1"  (1.549 m)   Wt 69.4 kg (153 lb)   SpO2 99%   BMI 28.91 kg/m   Intake/Output Summary (Last 24 hours) at 08/19/16 1100 Last data filed at 08/19/16 0816  Gross per 24 hour  Intake           851.25 ml  Output              500 ml  Net  351.25 ml   Filed Weights   08/18/16 2030  Weight: 69.4 kg (153 lb)    Exam:   General:  Frail, NAD  Cardiovascular: RRR  Respiratory: CTABL  Abdomen: Soft/ND/NT, positive BS  Musculoskeletal: trace lower Edema   Neuro: slow in answering questions, slightly impaired memory, no focal weakness  Data Reviewed: Basic Metabolic Panel:  Recent Labs Lab 08/18/16 1417 08/18/16 2052 08/19/16 0425  NA 134*  --  135  K 3.5  --  3.4*  CL 98*  --  102  CO2 27  --  27  GLUCOSE 130*  --  147*  BUN 18  --  12  CREATININE 0.68 0.64 0.55    CALCIUM 8.3*  --  7.8*   Liver Function Tests:  Recent Labs Lab 08/18/16 1417  AST 39  ALT 21  ALKPHOS 127*  BILITOT 0.9  PROT 6.2*  ALBUMIN 2.4*   No results for input(s): LIPASE, AMYLASE in the last 168 hours. No results for input(s): AMMONIA in the last 168 hours. CBC:  Recent Labs Lab 08/18/16 1417 08/18/16 2052 08/19/16 0425  WBC 8.5 7.9 6.3  NEUTROABS 7.2  --   --   HGB 11.0* 10.0* 9.8*  HCT 35.1* 31.4* 31.6*  MCV 83.0 82.2 82.9  PLT 229 213 191   Cardiac Enzymes:   No results for input(s): CKTOTAL, CKMB, CKMBINDEX, TROPONINI in the last 168 hours. BNP (last 3 results) No results for input(s): BNP in the last 8760 hours.  ProBNP (last 3 results) No results for input(s): PROBNP in the last 8760 hours.  CBG: No results for input(s): GLUCAP in the last 168 hours.  No results found for this or any previous visit (from the past 240 hour(s)).   Studies: Dg Chest 2 View  Result Date: 08/18/2016 CLINICAL DATA:  History of a fall 2 days ago.  Chest pain. EXAM: CHEST  2 VIEW COMPARISON:  11/11/2010 FINDINGS: The cardiac silhouette, mediastinal and hilar contours are within normal limits. There is tortuosity and calcification of the thoracic aorta. Airspace consolidation noted in the left upper lobe and also in the left lower lobe. These could represent infiltrates, aspiration or pulmonary contusions. Underline emphysematous changes and pulmonary scarring. No definite pleural effusions. No pneumothorax. The bony thorax is intact. No definite rib fractures are identified. IMPRESSION: Left upper lobe and left lower airspace opacities could reflect pneumonia/aspiration or pulmonary contusion. Followup PA and lateral chest X-ray is recommended in 3-4 weeks following trial of antibiotic therapy to ensure resolution and exclude underlying malignancy. Electronically Signed   By: Marijo Sanes M.D.   On: 08/18/2016 15:31   Ct Head Wo Contrast  Result Date: 08/18/2016 CLINICAL  DATA:  Witnessed fall from standing position this morning. Increased falls and decreased mobility recently. Initial encounter. EXAM: CT HEAD WITHOUT CONTRAST TECHNIQUE: Contiguous axial images were obtained from the base of the skull through the vertex without intravenous contrast. COMPARISON:  None. FINDINGS: Brain: There is no evidence of acute cortical infarct, intracranial hemorrhage, mass, midline shift, or extra-axial fluid collection. Mild cerebral atrophy is within normal limits for age. Periventricular white matter hypodensities are nonspecific but compatible with mild chronic small vessel ischemic disease. There is a lacunar infarct along the lateral margin of the left thalamus, chronic in appearance. Vascular: Calcified atherosclerosis at the skullbase. No hyperdense vessel. Skull: Hyperostosis frontalis interna. No fracture or destructive osseous lesion. Sinuses/Orbits: Minimal anterior left ethmoid air cell mucosal thickening. Clear mastoid air cells. Visualized orbits are unremarkable.  Other: None. IMPRESSION: 1. No evidence of acute intracranial abnormality. 2. Mild chronic small vessel ischemic disease. Electronically Signed   By: Logan Bores M.D.   On: 08/18/2016 16:02   Ct Chest Wo Contrast  Result Date: 08/18/2016 CLINICAL DATA:  Possible pneumonia.  Fall 2 days ago. EXAM: CT CHEST WITHOUT CONTRAST TECHNIQUE: Multidetector CT imaging of the chest was performed following the standard protocol without IV contrast. COMPARISON:  Chest x-ray from earlier today FINDINGS: Cardiovascular: No cardiomegaly. Small anterior pericardial effusion. Extensive mitral annular calcification. Coronary atherosclerosis. Atherosclerotic calcification. Mediastinum/Nodes: Debris in the esophagus without detectable wall thickening. Negative for adenopathy. Goiter with dystrophic calcifications in the right lobe. Lungs/Pleura: Dense consolidation in the posterior segment left upper lobe correlating with chest x-ray  findings. No evidence of airway debris. No pneumatocele, hemothorax, or pneumothorax. Trace left pleural effusion. Upper Abdomen: Hepatic steatosis chronic. Benign low-density lesion in the upper spleen, stable from 2012. Musculoskeletal: Spondylosis.  No acute or aggressive finding. IMPRESSION: 1. Segmental left upper lobe pneumonia. Followup PA and lateral chest X-ray is recommended 3 to 4 weeks following trial of antibiotic therapy to ensure resolution. 2. No posttraumatic finding. 3. Reflux or stasis in the esophagus. 4. Small anterior pericardial effusion. 5. Hepatic steatosis. Electronically Signed   By: Monte Fantasia M.D.   On: 08/18/2016 16:34    Scheduled Meds: . azithromycin  500 mg Oral Q24H  . cefTRIAXone (ROCEPHIN)  IV  1 g Intravenous Q24H  . enoxaparin (LOVENOX) injection  40 mg Subcutaneous Q24H  . famotidine  20 mg Oral Daily  . feeding supplement (ENSURE ENLIVE)  237 mL Oral BID BM  . menthol-zinc oxide  1 application Topical Daily  . multivitamin with minerals   Oral Daily  . potassium chloride  10 mEq Oral Daily  . potassium chloride  40 mEq Oral Once    Continuous Infusions: . sodium chloride 75 mL/hr at 08/18/16 2055     Time spent: *mins  Skai Lickteig MD, PhD  [facility] Pager [phone]. If 7PM-7AM, please contact night-coverage at [email or web address], password [facility] 08/19/2016, 11:00 AM  LOS: 1 day

## 2016-08-20 ENCOUNTER — Inpatient Hospital Stay (HOSPITAL_COMMUNITY): Payer: Medicare Other

## 2016-08-20 DIAGNOSIS — N390 Urinary tract infection, site not specified: Secondary | ICD-10-CM

## 2016-08-20 DIAGNOSIS — R627 Adult failure to thrive: Secondary | ICD-10-CM

## 2016-08-20 DIAGNOSIS — E860 Dehydration: Secondary | ICD-10-CM

## 2016-08-20 DIAGNOSIS — R6510 Systemic inflammatory response syndrome (SIRS) of non-infectious origin without acute organ dysfunction: Secondary | ICD-10-CM

## 2016-08-20 DIAGNOSIS — J181 Lobar pneumonia, unspecified organism: Secondary | ICD-10-CM

## 2016-08-20 DIAGNOSIS — E871 Hypo-osmolality and hyponatremia: Secondary | ICD-10-CM

## 2016-08-20 HISTORY — DX: Adult failure to thrive: R62.7

## 2016-08-20 HISTORY — DX: Dehydration: E86.0

## 2016-08-20 HISTORY — DX: Hypo-osmolality and hyponatremia: E87.1

## 2016-08-20 HISTORY — DX: Systemic inflammatory response syndrome (sirs) of non-infectious origin without acute organ dysfunction: R65.10

## 2016-08-20 HISTORY — DX: Urinary tract infection, site not specified: N39.0

## 2016-08-20 HISTORY — DX: Lobar pneumonia, unspecified organism: J18.1

## 2016-08-20 LAB — OCCULT BLOOD X 1 CARD TO LAB, STOOL: FECAL OCCULT BLD: NEGATIVE

## 2016-08-20 LAB — FOLATE: FOLATE: 8.3 ng/mL (ref >5.9)

## 2016-08-20 LAB — T4, FREE: Free T4: 1.23 ng/dL — ABNORMAL HIGH (ref 0.61–1.12)

## 2016-08-20 LAB — CBC WITH DIFFERENTIAL/PLATELET
BASOS ABS: 0 10*3/uL (ref 0.0–0.1)
Basophils Relative: 0 %
Eosinophils Absolute: 0 10*3/uL (ref 0.0–0.7)
Eosinophils Relative: 0 %
HEMATOCRIT: 31.7 % — AB (ref 36.0–46.0)
HEMOGLOBIN: 10.1 g/dL — AB (ref 12.0–15.0)
LYMPHS PCT: 19 %
Lymphs Abs: 1.1 10*3/uL (ref 0.7–4.0)
MCH: 26 pg (ref 26.0–34.0)
MCHC: 31.9 g/dL (ref 30.0–36.0)
MCV: 81.5 fL (ref 78.0–100.0)
Monocytes Absolute: 0.5 10*3/uL (ref 0.1–1.0)
Monocytes Relative: 9 %
NEUTROS ABS: 4.1 10*3/uL (ref 1.7–7.7)
NEUTROS PCT: 72 %
Platelets: 238 10*3/uL (ref 150–400)
RBC: 3.89 MIL/uL (ref 3.87–5.11)
RDW: 15.2 % (ref 11.5–15.5)
WBC: 5.7 10*3/uL (ref 4.0–10.5)

## 2016-08-20 LAB — EXPECTORATED SPUTUM ASSESSMENT W GRAM STAIN, RFLX TO RESP C

## 2016-08-20 LAB — BASIC METABOLIC PANEL
ANION GAP: 7 (ref 5–15)
BUN: 11 mg/dL (ref 6–20)
CO2: 24 mmol/L (ref 22–32)
Calcium: 7.6 mg/dL — ABNORMAL LOW (ref 8.9–10.3)
Chloride: 100 mmol/L — ABNORMAL LOW (ref 101–111)
Creatinine, Ser: 0.42 mg/dL — ABNORMAL LOW (ref 0.44–1.00)
GFR calc non Af Amer: >60 mL/min (ref >60)
GLUCOSE: 117 mg/dL — AB (ref 65–99)
POTASSIUM: 3.9 mmol/L (ref 3.5–5.1)
Sodium: 131 mmol/L — ABNORMAL LOW (ref 135–145)

## 2016-08-20 LAB — RETICULOCYTES
RBC.: 3.89 MIL/uL (ref 3.87–5.11)
RETIC CT PCT: 1.3 % (ref 0.4–3.1)
Retic Count, Absolute: 50.6 10*3/uL (ref 19.0–186.0)

## 2016-08-20 LAB — MAGNESIUM: Magnesium: 1.8 mg/dL (ref 1.7–2.4)

## 2016-08-20 LAB — PROTIME-INR
INR: 1.11
PROTHROMBIN TIME: 14.4 s (ref 11.4–15.2)

## 2016-08-20 LAB — EXPECTORATED SPUTUM ASSESSMENT W REFEX TO RESP CULTURE

## 2016-08-20 MED ORDER — MAGNESIUM SULFATE 2 GM/50ML IV SOLN
2.0000 g | Freq: Once | INTRAVENOUS | Status: AC
  Administered 2016-08-20: 2 g via INTRAVENOUS
  Filled 2016-08-20: qty 50

## 2016-08-20 MED ORDER — CEFTRIAXONE SODIUM 1 G IJ SOLR
1.0000 g | Freq: Once | INTRAMUSCULAR | Status: AC
  Administered 2016-08-20: 1 g via INTRAMUSCULAR
  Filled 2016-08-20: qty 10

## 2016-08-20 MED ORDER — FLUCONAZOLE IN SODIUM CHLORIDE 200-0.9 MG/100ML-% IV SOLN
200.0000 mg | Freq: Once | INTRAVENOUS | Status: AC
  Administered 2016-08-20: 200 mg via INTRAVENOUS
  Filled 2016-08-20: qty 100

## 2016-08-20 MED ORDER — METOPROLOL TARTRATE 12.5 MG HALF TABLET
12.5000 mg | ORAL_TABLET | Freq: Two times a day (BID) | ORAL | Status: DC
  Administered 2016-08-20 – 2016-08-21 (×3): 12.5 mg via ORAL
  Filled 2016-08-20 (×3): qty 1

## 2016-08-20 MED ORDER — GUAIFENESIN ER 600 MG PO TB12
600.0000 mg | ORAL_TABLET | Freq: Two times a day (BID) | ORAL | Status: DC
  Administered 2016-08-20 – 2016-08-21 (×3): 600 mg via ORAL
  Filled 2016-08-20 (×3): qty 1

## 2016-08-20 MED ORDER — NYSTATIN 100000 UNIT/ML MT SUSP
5.0000 mL | Freq: Four times a day (QID) | OROMUCOSAL | Status: DC
  Administered 2016-08-20 – 2016-08-21 (×4): 500000 [IU] via ORAL
  Filled 2016-08-20 (×4): qty 5

## 2016-08-20 MED ORDER — SODIUM CHLORIDE 0.9 % IV SOLN
INTRAVENOUS | Status: AC
  Administered 2016-08-20: 75 mL/h via INTRAVENOUS

## 2016-08-20 MED ORDER — FLUTICASONE PROPIONATE 50 MCG/ACT NA SUSP
1.0000 | Freq: Every day | NASAL | Status: DC
  Administered 2016-08-20 – 2016-08-21 (×2): 1 via NASAL
  Filled 2016-08-20: qty 16

## 2016-08-20 NOTE — Evaluation (Signed)
Clinical/Bedside Swallow Evaluation Patient Details  Name: Emily Mills MRN: [id number] Date of Birth: 04-24-1931  Today's Date: 08/20/2016 Time: SLP Start Time (ACUTE ONLY): 0810 SLP Stop Time (ACUTE ONLY): 0835 SLP Time Calculation (min) (ACUTE ONLY): 25 min  Past Medical History:  Past Medical History:  Diagnosis Date  . Hypertension   . Pre-diabetes   . Skin cancer    Past Surgical History:  Past Surgical History:  Procedure Laterality Date  . CHOLECYSTECTOMY     30 years ago   HPI:  82 yo female adm to [facility] after fall, diagnosed with left lower lobe pna.  Per chest CT pt with reflux or stasis in esophagus on CT.  Esophagram 11/30 showed presbyesophagus, tertiary contractions.  PMH + for FTT, UTI, frequent falls.  BSE ordered   Assessment / Plan / Recommendation Clinical Impression  Pt presents with functional oropharyngeal swallow based on clinical evaluation.   Pt does appear to have whitish patches consistent with oral candidiasis- RN informed.    Pt denies dysphagia, SLP reviewed esophagram with pt and provided compensation strategies for xerostomia *pt reports 2nd to medications* and esophageal mitigation strategies.    Pt observed to eat slowly with good tolerance.  Advised her to consume several small meals and frequent liquid intake.  No SlP follow up indicated.      Aspiration Risk  Mild aspiration risk (due to esophageal dysmotility)    Diet Recommendation Regular;Thin liquid   Liquid Administration via: Cup;Straw Medication Administration: Whole meds with liquid Supervision: Patient able to self feed Compensations: Minimize environmental distractions;Slow rate;Small sips/bites;Other (Comment) (drink liquids t/o meal) Postural Changes: Seated upright at 90 degrees;Remain upright for at least 30 minutes after po intake    Other  Recommendations Oral Care Recommendations: Oral care BID   Follow up Recommendations None      Frequency and Duration            Prognosis        Swallow Study   General Date of Onset: 08/20/16 HPI: 82 yo female adm to [facility] after fall, diagnosed with left lower lobe pna.  Per chest CT pt with reflux or stasis in esophagus on CT.  Esophagram 11/30 showed presbyesophagus, tertiary contractions.  PMH + for FTT, UTI, frequent falls.  BSE ordered Type of Study: Bedside Swallow Evaluation Diet Prior to this Study: Regular;Thin liquids Temperature Spikes Noted: No Respiratory Status: Room air History of Recent Intubation: No Behavior/Cognition: Alert;Cooperative;Pleasant mood Oral Cavity Assessment: Other (comment) (whitish coating in mouth that appears consistent with oral candidiasis ) Oral Care Completed by SLP:  (pt declined to brush her gums or tongue) Oral Cavity - Dentition: Dentures, top;Dentures, bottom Vision: Functional for self-feeding Self-Feeding Abilities: Able to feed self Patient Positioning: Upright in bed Baseline Vocal Quality: Normal Volitional Cough: Strong Volitional Swallow: Able to elicit    Oral/Motor/Sensory Function Overall Oral Motor/Sensory Function: Within functional limits   Ice Chips Ice chips: Not tested   Thin Liquid Thin Liquid: Within functional limits Presentation: Straw    Nectar Thick Nectar Thick Liquid: Not tested   Honey Thick Honey Thick Liquid: Not tested   Puree Puree: Within functional limits Presentation: Self Fed;Spoon   Solid   GO   Solid: Within functional limits Presentation: [place] [facility] SLP [phone]

## 2016-08-20 NOTE — Clinical Social Work Placement (Signed)
   CLINICAL SOCIAL WORK PLACEMENT  NOTE  Date:  08/20/2016  Patient Details  Name: Emily Mills MRN: [id number] Date of Birth: [birth year]  Clinical Social Work is seeking post-discharge placement for this patient at the [place] level of care (*CSW will initial, date and re-position this form in  chart as items are completed):  Yes   Patient/family provided with [place] Work Department's list of facilities offering this level of care within the geographic area requested by the patient (or if unable, by the patient's family).  Yes   Patient/family informed of their freedom to choose among providers that offer the needed level of care, that participate in Medicare, Medicaid or managed care program needed by the patient, have an available bed and are willing to accept the patient.  Yes   Patient/family informed of [facility]'s ownership interest in [facility] and [facility], as well as of the fact that they are under no obligation to receive care at these facilities.  PASRR submitted to EDS on 08/12/16     PASRR number received on 08/19/16     Existing PASRR number confirmed on       FL2 transmitted to all facilities in geographic area requested by pt/family on 08/19/16     FL2 transmitted to all facilities within larger geographic area on       Patient informed that his/her managed care company has contracts with or will negotiate with certain facilities, including the following:        No   Patient/family informed of bed offers received.  Patient chooses bed at [facility]     Physician recommends and patient chooses bed at      Patient to be transferred to [facility] on  .  Patient to be transferred to facility by       Patient family notified on   of transfer.  Name of family member notified:        PHYSICIAN       Additional Comment:    _______________________________________________ Luretha Rued, Marlinda Mike   [phone] 08/20/2016, 12:29 PM

## 2016-08-20 NOTE — Progress Notes (Signed)
Pt's IV infiltrated. Upon removing IV pt stated she refuses to get another IV. Pt due to be D/C'd to SNF tomorrow. Pt drinking adequate fluids without prompting. Paged MD about an IV abx that was due at 2100.  MD ordered for abx to be given IM. Pt in agreement with plan. Abx given  In right ventrogluteal. Pt tolerated with minimal soreness that subsided after a couple of minutes.  Will continue to monitor.  Emily Mills

## 2016-08-20 NOTE — Clinical Social Work Note (Signed)
Clinical Social Work Assessment  Patient Details  Name: Emily Mills MRN: [id number] Date of Birth: 1931/05/12  Date of referral:  08/20/16               Reason for consult:  Discharge Planning                Permission sought to share information with:  Chartered certified accountant granted to share information::  Yes, Verbal Permission Granted  Name::        Agency::     Relationship::     Contact Information:     Housing/Transportation Living arrangements for the past 2 months:  Single Family Home Source of Information:  Patient, Adult Children Patient Interpreter Needed:  None Criminal Activity/Legal Involvement Pertinent to Current Situation/Hospitalization:  No - Comment as needed Significant Relationships:  Adult Children Lives with:    Do you feel safe going back to the place where you live?  No Need for family participation in patient care:  Yes (Comment)  Care giving concerns:  Pt's care cannot be managed at home following hospital d/c.   Social Worker assessment / plan:  Pt hospitalized from [employer] with SIRS. CSW met with pt / spoke with daughter Emily Mills [phone] to assist with d/c planning. PT has recommended [facility] Rehab at d/c. Pt / daughter are in agreement with this plan. SNF search initiated and bed offers provided. Pt / daughter have chosen [facility]. SNF is able to admit pt this weekend if stable for d/c. CSW will continue to follow to assist with d/c planning needs.  Employment status:  Retired Nurse, adult PT Recommendations:  [place] / Referral to community resources:     Patient/Family's Response to care:  Pt/daughter are in agreement with ST Rehab placement.  Patient/Family's Understanding of and Emotional Response to Diagnosis, Current Treatment, and Prognosis:  Pt / daughter are aware of pt's medical status. " I feel weak. I think I  need help." Support / reassurance provided. Pt / daughter appreciate CSW assistance with d/c planning.  Emotional Assessment Appearance:  Appears stated age Attitude/Demeanor/Rapport:  Other (cooperative) Affect (typically observed):  Pleasant, Calm, Appropriate Orientation:  Oriented to Self, Oriented to Place, Oriented to  Time, Oriented to Situation Alcohol / Substance use:  Not Applicable Psych involvement (Current and /or in the community):  No (Comment)  Discharge Needs  Concerns to be addressed:  Discharge Planning Concerns Readmission within the last 30 days:  No Current discharge risk:  None Barriers to Discharge:  No Barriers Identified   Loreda Silverio, Randall An, LCSW 08/20/2016, 12:21 PM

## 2016-08-20 NOTE — Progress Notes (Addendum)
PROGRESS NOTE  Emily Mills [id number] DOB: Dec 30, 1930 DOA: 08/18/2016 PCP: Hulen Shouts, MD  HPI/Recap of past 24 hours:  No fever last 24hs, looks a lot brighter and pleasant, She report urinary and bowel incontinence, denies abdominal pain   Assessment/Plan: Active Problems:   Lobar pneumonia, unspecified organism ([place])   Essential hypertension   Dehydration, mild   Hyponatremia   UTI (urinary tract infection)   FTT (failure to thrive) in adult   Frequent falls   Prediabetes   SIRS (systemic inflammatory response syndrome) ([place])  Sepsis presented on admission with fever 102 , sinus tachycardia heart rate 116, tachypnea rr 34, acute hypoxic respiratory failure with left upper lobe pna and uti No leukocytosis, lactic acid was not checked Blood culture /urine culture +ecoli, sputum sample was not sent Improving on ivf and iv abx, fever resolved. She did has slight confusion initial on presentation this has much improved on 12/1.  Acute hypoxic respiratory failure /Left upper lobe pna: o2 sats dropped to 88% on room air in the ED CT chest : Segmental left upper lobe pneumonia. Followup PA and lateral chest X-ray is recommended 3 to 4 weeks following trial of antibiotic therapy to ensure resolution. Better, now on room air at rest, respiration rate better Continue rocephin/zithro, speech eval with mild aspiration risk due to esophageal dysmotilit,  will need to ambulate check o2 sats,   UTI:  Cr wnl, urine culture + pan sensitive ecoli, on abx  Normocytic anemia: per daughter patient has a long history of iron deficiency hgb 11-10-9.8 b12 wnl, folate, retic count pending, Anemia panel showed iron deficiency, will avoid iv iron in the setting of acute infection, will start oral iron supplement at discharge   HTN; bp stable, bp meds held in the setting of sepsis bp start to trend up with slight sinus tachycardia, will start low dose  lopressor  Presbyesophagus DG esophagus  "1. Presbyesophagus. Decreased esophageal motility and frequent tertiary contractions. 2. A 12.5 mm barium tablet did not pass freely through the distal esophagus, however, this seems more related to presbyesophagus and not to any esophageal narrowing/stricture. This could be confirmed with a repeat esophagram once the patient recovers from the acute illness and is able to stand and tolerate a double contrast study. 3. No aspiration was observed.  Impaired fasting blood sugar, check a1c  Hypokalemia/hyponatremia Lasix held since admission Na normalized with ivf, k replaced, check mag  suppressed tsh:  free t4 upper normal range  erythema and maceration in the perineal area and in the area of the pubis secondary to wet pads being held in place with mesh briefs woc input appreciated " implement a low air loss mattress for a few days, turn and reposition off of the supine position, and cleanse using our house incontinence cleanser followed by a moisture barrier ointment. This should resolve the problem efficiently." "  Additionally, we ill provide a pressure redistribution chair cushion for her use when OOB in chair both here and following discharge. It is wetness resistant"   Oral thrush, topical nystatin and iv diflucan, patient denies odynophagia  FTT, weight loss (Body mass index is 28.91 kg/m.) , frequent falls, daughter reported patient having incontinence, progressive lower extremity weakness CT head no acute findings, mri lumber spine on 12/1,  orthostatic vital signs pending,  Tele with sinus tachycardia, few pac's, low dose lopressor started on 12/1 Nutrition/PT/OT, SNF  Code Status: DNR , confirmed with daughter  Family Communication: patient and daughter at bedside  Disposition Plan:  snf , hopefully on 12/2   Consultants:  none  Procedures:  none  Antibiotics:  Rocephin/zithro   Objective: BP (!) 142/85 (BP  Location: Right Arm)   Pulse (!) 105   Temp 98.9 F (37.2 C) (Oral)   Resp 20   Ht 5\' 1"  (1.549 m)   Wt 69.4 kg (153 lb)   SpO2 95%   BMI 28.91 kg/m   Intake/Output Summary (Last 24 hours) at 08/20/16 1106 Last data filed at 08/20/16 0930  Gross per 24 hour  Intake          1131.25 ml  Output                0 ml  Net          1131.25 ml   Filed Weights   08/18/16 2030  Weight: 69.4 kg (153 lb)    Exam:   General:  Frail, NAD  Cardiovascular: RRR  Respiratory: CTABL  Abdomen: Soft/ND/NT, positive BS  Musculoskeletal: no Edema   Neuro: slight confusion and mild impaired memory initially on presentation has resolved, today she is fully alert , aaox3, short term memory intact,  no focal weakness  Data Reviewed: Basic Metabolic Panel:  Recent Labs Lab 08/18/16 1417 08/18/16 2052 08/19/16 0425 08/20/16 0412  NA 134*  --  135 131*  K 3.5  --  3.4* 3.9  CL 98*  --  102 100*  CO2 27  --  27 24  GLUCOSE 130*  --  147* 117*  BUN 18  --  12 11  CREATININE 0.68 0.64 0.55 0.42*  CALCIUM 8.3*  --  7.8* 7.6*  MG  --   --   --  1.8   Liver Function Tests:  Recent Labs Lab 08/18/16 1417  AST 39  ALT 21  ALKPHOS 127*  BILITOT 0.9  PROT 6.2*  ALBUMIN 2.4*   No results for input(s): LIPASE, AMYLASE in the last 168 hours. No results for input(s): AMMONIA in the last 168 hours. CBC:  Recent Labs Lab 08/18/16 1417 08/18/16 2052 08/19/16 0425 08/20/16 0412  WBC 8.5 7.9 6.3 5.7  NEUTROABS 7.2  --   --  4.1  HGB 11.0* 10.0* 9.8* 10.1*  HCT 35.1* 31.4* 31.6* 31.7*  MCV 83.0 82.2 82.9 81.5  PLT 229 213 191 238   Cardiac Enzymes:   No results for input(s): CKTOTAL, CKMB, CKMBINDEX, TROPONINI in the last 168 hours. BNP (last 3 results) No results for input(s): BNP in the last 8760 hours.  ProBNP (last 3 results) No results for input(s): PROBNP in the last 8760 hours.  CBG: No results for input(s): GLUCAP in the last 168 hours.  Recent Results (from  the past 240 hour(s))  Urine culture     Status: Abnormal (Preliminary result)   Collection Time: 08/18/16  2:17 PM  Result Value Ref Range Status   Specimen Description URINE, RANDOM  Final   Special Requests NONE  Final   Culture >=100,000 COLONIES/mL ESCHERICHIA COLI (A)  Final   Report Status PENDING  Incomplete   Organism ID, Bacteria ESCHERICHIA COLI (A)  Final      Susceptibility   Escherichia coli - MIC*    AMPICILLIN <=2 SENSITIVE Sensitive     CEFAZOLIN <=4 SENSITIVE Sensitive     CEFTRIAXONE <=1 SENSITIVE Sensitive     CIPROFLOXACIN <=0.25 SENSITIVE Sensitive     GENTAMICIN <=1 SENSITIVE Sensitive     IMIPENEM <=0.25 SENSITIVE Sensitive  NITROFURANTOIN <=16 SENSITIVE Sensitive     TRIMETH/SULFA <=20 SENSITIVE Sensitive     AMPICILLIN/SULBACTAM <=2 SENSITIVE Sensitive     PIP/TAZO <=4 SENSITIVE Sensitive     Extended ESBL NEGATIVE Sensitive     * >=100,000 COLONIES/mL ESCHERICHIA COLI  Culture, blood (routine x 2) Call MD if unable to obtain prior to antibiotics being given     Status: None (Preliminary result)   Collection Time: 08/18/16  8:52 PM  Result Value Ref Range Status   Specimen Description BLOOD LEFT ARM  Final   Special Requests BOTTLES DRAWN AEROBIC AND ANAEROBIC 5CC  Final   Culture   Final    NO GROWTH < 24 HOURS Performed at [facility]    Report Status PENDING  Incomplete  Culture, blood (routine x 2) Call MD if unable to obtain prior to antibiotics being given     Status: None (Preliminary result)   Collection Time: 08/18/16  8:52 PM  Result Value Ref Range Status   Specimen Description BLOOD RIGHT ARM  Final   Special Requests BOTTLES DRAWN AEROBIC AND ANAEROBIC 5CC  Final   Culture   Final    NO GROWTH < 24 HOURS Performed at [facility]    Report Status PENDING  Incomplete  MRSA PCR Screening     Status: None   Collection Time: 08/19/16  7:00 PM  Result Value Ref Range Status   MRSA by PCR NEGATIVE NEGATIVE Final     Comment:        The GeneXpert MRSA Assay (FDA approved for NASAL specimens only), is one component of a comprehensive MRSA colonization surveillance program. It is not intended to diagnose MRSA infection nor to guide or monitor treatment for MRSA infections.      Studies: Dg Esophagus  Result Date: 08/19/2016 CLINICAL DATA:  82 year old female with pneumonia, acid reflux. Initial encounter. EXAM: ESOPHOGRAM / BARIUM SWALLOW / BARIUM TABLET STUDY TECHNIQUE: Combined double contrast and single contrast examination performed using thick barium liquid and thin barium liquid. The patient was observed with fluoroscopy swallowing a 13 mm barium sulphate tablet. FLUOROSCOPY TIME:  Fluoroscopy Time:  1 minutes 12 seconds Radiation Exposure Index (if provided by the fluoroscopic device): Number of Acquired Spot Images: 0 COMPARISON:  Chest CT without contrast 08/18/2016. FINDINGS: The patient was unable to stand, so a single contrast study was undertaken with the patient inclined at 30 degrees on the fluoroscopy table. No obstruction to the forward flow of contrast throughout the esophagus and into the stomach. Frequent tertiary contractions. No aspiration was observed. A 12.5 mm barium tablet was administered and passed freely to the midesophagus level. After this the tablet passed slowly into the distal esophagus. It did not pass through the distal most esophagus or gastroesophageal junction despite several additional swallows of water, thick barium, and thin barium. However, no discrete stricture or narrowing in the region is evident (series 12). IMPRESSION: 1. Presbyesophagus. Decreased esophageal motility and frequent tertiary contractions. 2. A 12.5 mm barium tablet did not pass freely through the distal esophagus, however, this seems more related to presbyesophagus and not to any esophageal narrowing/stricture. This could be confirmed with a repeat esophagram once the patient recovers from the acute  illness and is able to stand and tolerate a double contrast study. 3. No aspiration was observed. Electronically Signed   By: Genevie Ann M.D.   On: 08/19/2016 13:24    Scheduled Meds: . azithromycin  500 mg Oral Q24H  . cefTRIAXone (  ROCEPHIN)  IV  1 g Intravenous Q24H  . enoxaparin (LOVENOX) injection  40 mg Subcutaneous Q24H  . famotidine  20 mg Oral Daily  . feeding supplement (ENSURE ENLIVE)  237 mL Oral BID BM  . fluconazole (DIFLUCAN) IV  200 mg Intravenous Once  . guaiFENesin  600 mg Oral BID  . magnesium sulfate 1 - 4 g bolus IVPB  2 g Intravenous Once  . menthol-zinc oxide  1 application Topical Daily  . metoprolol tartrate  12.5 mg Oral BID  . multivitamin with minerals   Oral Daily  . nystatin  5 mL Oral QID  . potassium chloride  10 mEq Oral Daily    Continuous Infusions:    Time spent: 64mins  Aunisty Reali MD, PhD  [facility] Pager [phone]. If 7PM-7AM, please contact night-coverage at [email or web address], password [facility] 08/20/2016, 11:06 AM  LOS: 2 days

## 2016-08-21 DIAGNOSIS — R7303 Prediabetes: Secondary | ICD-10-CM

## 2016-08-21 DIAGNOSIS — R296 Repeated falls: Secondary | ICD-10-CM

## 2016-08-21 DIAGNOSIS — R946 Abnormal results of thyroid function studies: Secondary | ICD-10-CM

## 2016-08-21 DIAGNOSIS — R627 Adult failure to thrive: Secondary | ICD-10-CM

## 2016-08-21 DIAGNOSIS — E860 Dehydration: Secondary | ICD-10-CM

## 2016-08-21 DIAGNOSIS — J181 Lobar pneumonia, unspecified organism: Secondary | ICD-10-CM

## 2016-08-21 DIAGNOSIS — I10 Essential (primary) hypertension: Secondary | ICD-10-CM

## 2016-08-21 DIAGNOSIS — A419 Sepsis, unspecified organism: Secondary | ICD-10-CM

## 2016-08-21 DIAGNOSIS — A4151 Sepsis due to Escherichia coli [E. coli]: Secondary | ICD-10-CM

## 2016-08-21 DIAGNOSIS — B370 Candidal stomatitis: Secondary | ICD-10-CM

## 2016-08-21 DIAGNOSIS — R7302 Impaired glucose tolerance (oral): Secondary | ICD-10-CM

## 2016-08-21 DIAGNOSIS — N390 Urinary tract infection, site not specified: Secondary | ICD-10-CM

## 2016-08-21 LAB — BASIC METABOLIC PANEL
Anion gap: 7 (ref 5–15)
BUN: 8 mg/dL (ref 6–20)
CHLORIDE: 102 mmol/L (ref 101–111)
CO2: 26 mmol/L (ref 22–32)
CREATININE: 0.52 mg/dL (ref 0.44–1.00)
Calcium: 7.7 mg/dL — ABNORMAL LOW (ref 8.9–10.3)
GFR calc Af Amer: >60 mL/min (ref >60)
GFR calc non Af Amer: >60 mL/min (ref >60)
GLUCOSE: 117 mg/dL — AB (ref 65–99)
Potassium: 4.1 mmol/L (ref 3.5–5.1)
SODIUM: 135 mmol/L (ref 135–145)

## 2016-08-21 LAB — URINE CULTURE

## 2016-08-21 LAB — HEMOGLOBIN A1C
Hgb A1c MFr Bld: 6.1 % — ABNORMAL HIGH (ref 4.8–5.6)
MEAN PLASMA GLUCOSE: 128 mg/dL

## 2016-08-21 MED ORDER — AZITHROMYCIN 500 MG PO TABS: 500.0000 mg | ORAL_TABLET | ORAL | 0 refills | Status: DC

## 2016-08-21 MED ORDER — T.E.D. BELOW KNEE/M-REGULAR MISC: 0 refills | Status: DC

## 2016-08-21 MED ORDER — SENNOSIDES-DOCUSATE SODIUM 8.6-50 MG PO TABS: 1.0000 | ORAL_TABLET | Freq: Every evening | ORAL | Status: DC | PRN

## 2016-08-21 MED ORDER — FAMOTIDINE 20 MG PO TABS: 20.0000 mg | ORAL_TABLET | Freq: Every day | ORAL | Status: DC

## 2016-08-21 MED ORDER — CEFPODOXIME PROXETIL 100 MG PO TABS: 100.0000 mg | ORAL_TABLET | Freq: Two times a day (BID) | ORAL | 0 refills | Status: DC

## 2016-08-21 MED ORDER — NYSTATIN 100000 UNIT/ML MT SUSP: 5.0000 mL | Freq: Four times a day (QID) | OROMUCOSAL | 0 refills | Status: DC

## 2016-08-21 MED ORDER — GUAIFENESIN ER 600 MG PO TB12: 600.0000 mg | ORAL_TABLET | Freq: Two times a day (BID) | ORAL | Status: DC | PRN

## 2016-08-21 NOTE — Progress Notes (Signed)
MSAD to both buttocks where diapers have rubbed skin. approx  1 cm excoriated area purple-tan in color, healing. Klarisa Barman, [employer]

## 2016-08-21 NOTE — Plan of Care (Signed)
Problem: Health Behavior/Discharge Planning: Goal: Ability to manage health-related needs will improve Outcome: Adequate for Discharge To SNF

## 2016-08-21 NOTE — Discharge Summary (Signed)
Physician Discharge Summary  Emily Mills [id number] DOB: [birth year] DOA: 08/18/2016  PCP: London Pepper, MD  Admit date: 08/18/2016 Discharge date: 08/21/2016  Admitted From: [facility]irement home  Disposition:  SNF   Recommendations for Outpatient Follow-up:  1. F/u TFTs in 3-4 wks 2. Repeat CXR in 4 wks 3. repeat esophagram once the patient recovers from the acute illness and is able to stand and tolerate a double contrast study. 4.            SLP eval recommendations>> Minimize environmental distractions;Slow rate;Small sips/bites;Other (Comment) (drink liquids t/o meal) Postural Changes: Seated upright at 90 degrees;Remain upright for at least 30 minutes after po intake  5.            For venous stasis, use TEDS - Furosemide and K+ held 6.            Will go to SNF 7.            F/u maceration of skin in perianal area   Discharge Condition:  stable   CODE STATUS:  DNR   Diet recommendation:  Heart healthy Consultations:      Discharge Diagnoses:  Principal Problem:   Lobar pneumonia, unspecified organism ([place]) Active Problems:   UTI (urinary tract infection)   Sepsis ([place])   Essential hypertension   Dehydration, mild   Hyponatremia   FTT (failure to thrive) in adult   Frequent falls   Glucose intolerance (impaired glucose tolerance)   Thrush   Abnormal thyroid function test    Subjective: Coughed up a lot of clear sputum this AM. Otherwise she has no complaints. Anxious to leave the hospital.   Brief Summary/ HPI Emily Mills  is a 82 y.o. female, With history of hypertension and prediabetes resident of a retirement home brought to the ED for frequent falls. As per daughter at bedside patient has had at least 4 falls in the past 6 weeks at the retirement home. During one of those falls patient seems to have syncopized and does not remember the incident. No injury sustained. During these past 6 weeks patient has been noticed to be significantly declining with  generalized weakness, poor by mouth intake along with urinary incontinence. Patient also had increased bilateral leg swellings formation was started on Lasix by her PCP and was increased. This morning she again fell while standing but without loss of consciousness or head injury. Patient had an appointment with her PCP for her increased falls. In the ER, she was tachycardic and tachypnea, pulse ox 88% on room air, CXR and CT showed LUL pneumonia and UA suggestive of UTI.   Hospital Course:   Sepsis  - presented on admission with fever 102 , sinus tachycardia heart rate 116, tachypnea rr 34, acute hypoxic respiratory failure with left upper lobe Pneumonia and UTI - treated with Rocephin and Zithromax- switched to Vantin for total 7 days - cont oral Zithromax for total 5 day - sputum and blood cultures unrevealing, strep antigen negative - coughing up clear sputum, no dyspnea, fever resolved, no longer hypoxic- pulse ox 99% on room air - repeat CXR in 4 wks  Acute hypoxic resp failure - resolved  UTI- will incontinence - pan sensitive e coli  Presbyesophagus? - CT showed reflux vs stasis in esophagus- SLP eval obtained and esophagram  DG esophagus  "1. Presbyesophagus. Decreased esophageal motility and frequent tertiary contractions. 2. A 12.5 mm barium tablet did not pass freely through the distal esophagus, however, this seems more related  to presbyesophagus and not to any esophageal narrowing/stricture. This could be confirmed with a repeat esophagram once the patient recovers from the acute illness and is able to stand and tolerate a double contrast study. 3. No aspiration was observed. - pepcid started for now  Anemia - results of anemia panel consistent with AOCD  Ref. Range 08/18/2016 14:17 08/20/2016 04:12  Iron Latest Ref Range: 28 - 170 ug/dL 7 (L)   UIBC Latest Units: ug/dL 165   TIBC Latest Ref Range: 250 - 450 ug/dL 172 (L)   Saturation Ratios Latest Ref Range: 10.4  - 31.8 % 4 (L)   Ferritin Latest Ref Range: 11 - 307 ng/mL 132   Folate Latest Ref Range: >5.9 ng/mL  8.3  Results for HOLLIDAY, CALLAWAY (MRN [id number]) as of 08/21/2016 13:00  Ref. Range 08/18/2016 14:17  Vitamin B12 Latest Ref Range: 180 - 914 pg/mL 415    Abnormal TFTs: Results for BOBBI, CLAES (MRN [id number]) as of 08/21/2016 13:00  Ref. Range 08/18/2016 14:17 08/20/2016 04:12  TSH Latest Ref Range: 0.350 - 4.500 uIU/mL 0.282 (L)   T4,Free(Direct) Latest Ref Range: 0.61 - 1.12 ng/dL  1.23 (H)   Will need to repeat once acute illness resolves  Glucose intolerance - will hold off on initiating treatment at her age    Ref. Range 08/20/2016 04:12  Hemoglobin A1C Latest Ref Range: 4.8 - 5.6 % 6.1 (H)   Erythema and maceration in the perineal area and in the area of the pubis  - secondary to U incontinence, wet pads being held in place with mesh briefs WOC input appreciated "implement a low air loss mattress for a few days, turn and reposition off of the supine position, and cleanse using our house incontinence cleanser followed by a moisture barrier ointment. This should resolve the problem efficiently." " Additionally, we ill provide a pressure redistribution chair cushion for her use when OOB in chair both here and following discharge. It is wetness resistant"  FTT, weight loss , frequent falls, daughter reported patient having incontinence, progressive lower extremity weakness - CT head - no acute findings - MRI lumber spine on 12/1 showed Neural foraminal narrowing L3-4 through L5-S1: Moderate to severe bilaterally at L4-5.- at this point she is able to walk down the hall with a rolling walker- no pain or numbness  Thrush - Nystatin  Discharge Instructions  Discharge Instructions    Diet - low sodium heart healthy    Complete by:  As directed    Discharge instructions    Complete by:  As directed    Needs TEDs daily for venous statis   Increase activity slowly     Complete by:  As directed        Medication List    STOP taking these medications   furosemide 20 MG tablet Commonly known as:  LASIX   potassium chloride 10 MEQ tablet Commonly known as:  K-DUR     TAKE these medications   amLODipine 2.5 MG tablet Commonly known as:  NORVASC Take 2.5 mg by mouth daily.   azithromycin 500 MG tablet Commonly known as:  ZITHROMAX Take 1 tablet (500 mg total) by mouth daily.   cefpodoxime 100 MG tablet Commonly known as:  VANTIN Take 1 tablet (100 mg total) by mouth 2 (two) times daily.   DAILY MULTI 50+ PO Take 1 tablet by mouth daily.   famotidine 20 MG tablet Commonly known as:  PEPCID Take 1 tablet (20 mg total) by mouth  daily. Start taking on:  08/22/2016   guaiFENesin 600 MG 12 hr tablet Commonly known as:  MUCINEX Take 1 tablet (600 mg total) by mouth 2 (two) times daily as needed.   loperamide 2 MG capsule Commonly known as:  IMODIUM Take 2 mg by mouth 4 (four) times daily as needed for diarrhea or loose stools.   menthol-zinc oxide powder Apply 1 application topically daily.   nystatin 100000 UNIT/ML suspension Commonly known as:  MYCOSTATIN Take 5 mLs (500,000 Units total) by mouth 4 (four) times daily.   nystatin cream Commonly known as:  MYCOSTATIN Apply 1 application topically daily as needed for irritation or rash.   senna-docusate 8.6-50 MG tablet Commonly known as:  Senokot-S Take 1 tablet by mouth at bedtime as needed for mild constipation.   T.E.D. BELOW KNEE/M-REGULAR Misc On in AM and off in PM. Medical supply store to measure and give appropriate size. Moderate compression.            Durable Medical Equipment        Start     Ordered   08/18/16 1844  For home use only DME standard manual wheelchair with seat cushion  Once    Comments:  Patient suffers from history of hypertension and prediabetes resident of a retirement home brought to the ED for frequent falls and generalized weakness. As per  daughter at bedside patient has had at least 4 falls in the past 6 weeks at the retirement home. During one of those falls patient seems to have syncopized and does not remember the incident.which impairs their ability to perform daily activities like bathing, dressing and mobility in the home.  This morning (08/18/16) she again fell while standing but without loss of consciousness or head injury A cane nor walker will not resolve  issue with performing activities of daily living. A wheelchair will allow patient to safely perform daily activities. Patient can safely propel the wheelchair in the home or has a caregiver who can provide assistance.  Accessories: elevating leg rests (ELRs), wheel locks, extensions and anti-tippers.   08/18/16 1847     Follow-up Information    Hulen Shouts, MD Follow up in 1 week(s).   Specialty:  Family Medicine Why:  hospital discharge follow up pmd to arrange repeat two view cxr to follow up on left upper lobe pneumonia Contact information: [place] [id number] [phone]          No Known Allergies   Procedures/Studies: SLP eval  Dg Chest 2 View  Result Date: 08/18/2016 CLINICAL DATA:  History of a fall 2 days ago.  Chest pain. EXAM: CHEST  2 VIEW COMPARISON:  11/11/2010 FINDINGS: The cardiac silhouette, mediastinal and hilar contours are within normal limits. There is tortuosity and calcification of the thoracic aorta. Airspace consolidation noted in the left upper lobe and also in the left lower lobe. These could represent infiltrates, aspiration or pulmonary contusions. Underline emphysematous changes and pulmonary scarring. No definite pleural effusions. No pneumothorax. The bony thorax is intact. No definite rib fractures are identified. IMPRESSION: Left upper lobe and left lower airspace opacities could reflect pneumonia/aspiration or pulmonary contusion. Followup PA and lateral chest X-ray is recommended in 3-4  weeks following trial of antibiotic therapy to ensure resolution and exclude underlying malignancy. Electronically Signed   By: Marijo Sanes M.D.   On: 08/18/2016 15:31   Ct Head Wo Contrast  Result Date: 08/18/2016 CLINICAL DATA:  Witnessed fall from standing  position this morning. Increased falls and decreased mobility recently. Initial encounter. EXAM: CT HEAD WITHOUT CONTRAST TECHNIQUE: Contiguous axial images were obtained from the base of the skull through the vertex without intravenous contrast. COMPARISON:  None. FINDINGS: Brain: There is no evidence of acute cortical infarct, intracranial hemorrhage, mass, midline shift, or extra-axial fluid collection. Mild cerebral atrophy is within normal limits for age. Periventricular white matter hypodensities are nonspecific but compatible with mild chronic small vessel ischemic disease. There is a lacunar infarct along the lateral margin of the left thalamus, chronic in appearance. Vascular: Calcified atherosclerosis at the skullbase. No hyperdense vessel. Skull: Hyperostosis frontalis interna. No fracture or destructive osseous lesion. Sinuses/Orbits: Minimal anterior left ethmoid air cell mucosal thickening. Clear mastoid air cells. Visualized orbits are unremarkable. Other: None. IMPRESSION: 1. No evidence of acute intracranial abnormality. 2. Mild chronic small vessel ischemic disease. Electronically Signed   By: Logan Bores M.D.   On: 08/18/2016 16:02   Ct Chest Wo Contrast  Result Date: 08/18/2016 CLINICAL DATA:  Possible pneumonia.  Fall 2 days ago. EXAM: CT CHEST WITHOUT CONTRAST TECHNIQUE: Multidetector CT imaging of the chest was performed following the standard protocol without IV contrast. COMPARISON:  Chest x-ray from earlier today FINDINGS: Cardiovascular: No cardiomegaly. Small anterior pericardial effusion. Extensive mitral annular calcification. Coronary atherosclerosis. Atherosclerotic calcification. Mediastinum/Nodes: Debris in the  esophagus without detectable wall thickening. Negative for adenopathy. Goiter with dystrophic calcifications in the right lobe. Lungs/Pleura: Dense consolidation in the posterior segment left upper lobe correlating with chest x-ray findings. No evidence of airway debris. No pneumatocele, hemothorax, or pneumothorax. Trace left pleural effusion. Upper Abdomen: Hepatic steatosis chronic. Benign low-density lesion in the upper spleen, stable from 2012. Musculoskeletal: Spondylosis.  No acute or aggressive finding. IMPRESSION: 1. Segmental left upper lobe pneumonia. Followup PA and lateral chest X-ray is recommended 3 to 4 weeks following trial of antibiotic therapy to ensure resolution. 2. No posttraumatic finding. 3. Reflux or stasis in the esophagus. 4. Small anterior pericardial effusion. 5. Hepatic steatosis. Electronically Signed   By: Monte Fantasia M.D.   On: 08/18/2016 16:34   Mr Lumbar Spine Wo Contrast  Result Date: 08/20/2016 CLINICAL DATA:  Frequent falls, bilateral lower extremity weakness and incontinence. EXAM: MRI LUMBAR SPINE WITHOUT CONTRAST TECHNIQUE: Multiplanar, multisequence MR imaging of the lumbar spine was performed. No intravenous contrast was administered. Patient is unable to tolerate further imaging, axial T1 sequence not obtained. COMPARISON:  CT abdomen and pelvis November 11, 2010 FINDINGS: Moderately motion degraded examination. Patient discontinued the examination prior to axial T1 sequence. SEGMENTATION: For the purposes of this report, the last well-formed intervertebral disc will be described as L5-S1. ALIGNMENT: Maintenance of the lumbar lordosis. No malalignment. VERTEBRAE:Vertebral bodies are intact. Severe L4-5 and moderate to severe L5-S1 disc height loss. Remaining disc morphology preserved. Decreased T2 signal within all disc compatible with mild desiccation. Moderate L4-5 and L5-S1 subacute on chronic discogenic endplate changes. Mild L1-2, L2-3 ventral endplate  spurring. Mild subacute discogenic endplate changes [id number]. No suspicious bone marrow signal. CONUS MEDULLARIS: Conus medullaris terminates at L1-2 and demonstrates normal morphology and signal characteristics. Cauda equina is normal. PARASPINAL AND SOFT TISSUES: Included prevertebral and paraspinal soft tissues are nonacute. 3.7 cm benign-appearing RIGHT lower pole renal cyst. Moderate symmetric paraspinal muscle atrophy. Abnormal signal LEFT S3 nerve root may represent Tarlov cysts, incompletely characterized. DISC LEVELS (limited assessment: Lack of axial T1, moderately motion degraded axial T2): T12-L1: No disc bulge, canal stenosis nor neural foraminal narrowing. L1-2: Small  LEFT subarticular to extra foraminal disc protrusion. No canal stenosis or neural foraminal narrowing. L2-3:  No disc bulge, canal stenosis nor neural foraminal narrowing. L3-4: Small broad-based disc bulge. Moderate LEFT extra foraminal disc protrusion. Mild facet arthropathy and ligamentum flavum redundancy. No canal stenosis. Mild RIGHT, mild to moderate LEFT neural foraminal narrowing. L4-5: Moderate broad-based disc bulge, LEFT central disc extrusion with 8 mm of contiguous inferior migration, difficult to characterize due to patient motion. Moderate facet arthropathy and ligamentum flavum redundancy. Mild canal stenosis. Moderate to severe bilateral neural foraminal narrowing. L5-S1: Moderate LEFT central broad-based disc protrusion. Mild RIGHT, moderate LEFT facet arthropathy. No canal stenosis though partial effacement LEFT lateral recess could affect the traversing LEFT S1 nerve. Mild to moderate RIGHT, moderate LEFT neural foraminal narrowing. IMPRESSION: Motion degraded, limited examination. No acute fracture or malalignment. Mild canal stenosis L4-5, partial lateral recess effacement L5-S1 could affect the traversing LEFT S1 nerve. Small L4-5 disc extrusion. Neural foraminal narrowing L3-4 through L5-S1: Moderate to severe  bilaterally at L4-5. Electronically Signed   By: Elon Alas M.D.   On: 08/20/2016 19:45   US Abdomen Complete  Result Date: 07/29/2016 CLINICAL DATA:  Elevated liver function studies, bloating, increased gas. 15 pound weight loss over the past 6 months. Previous cholecystectomy. EXAM: ABDOMEN ULTRASOUND COMPLETE COMPARISON:  Abdominal pelvic CT scan of November 11, 2010. FINDINGS: Gallbladder: The gallbladder is surgically absent. Common bile duct: Diameter: 8.8 mm Liver: The hepatic echotexture is mildly increased and slightly heterogeneous. There is no focal mass nor ductal dilation. There is a left lobe cyst measuring 11 mm in diameter which is been previously demonstrated. IVC: Bowel gas limits visualization of the IVC. Pancreas: Bowel gas limits visualization of the pancreas. Spleen: There is splenomegaly. Spleen measures 16.4 x 14.7 x 4.3 cm with a calculated volume of 539 cc. There is a stable cyst near the dome of the spleen measuring 1 cm in diameter. Right Kidney: Length: 11.3 cm. The cortical echotexture remains lower than that of the liver. There is no hydronephrosis. There is a lower pole cyst measuring 3.3 x 3.6 x 4 cm. Left Kidney: Length: 10.9 cm. Echogenicity within normal limits. No mass or hydronephrosis visualized. Abdominal aorta: Visualization of the abdominal aorta is limited due to bowel gas. Other findings: There is no ascites. IMPRESSION: 1. Common bile duct dilation to 8.8 mm is not abnormal post cholecystectomy. Fatty infiltrative change of the liver. Mild splenomegaly with calculated volume of 539 cc. Simple appearing 1 cm diameter splenic cyst. 2. No hydronephrosis or evidence of stones. Simple appearing lower pole cyst in the right kidney. 3. No ascites. Electronically Signed   By: David  Martinique M.D.   On: 07/29/2016 10:00   Dg Esophagus  Result Date: 08/19/2016 CLINICAL DATA:  82 year old female with pneumonia, acid reflux. Initial encounter. EXAM: ESOPHOGRAM / BARIUM  SWALLOW / BARIUM TABLET STUDY TECHNIQUE: Combined double contrast and single contrast examination performed using thick barium liquid and thin barium liquid. The patient was observed with fluoroscopy swallowing a 13 mm barium sulphate tablet. FLUOROSCOPY TIME:  Fluoroscopy Time:  1 minutes 12 seconds Radiation Exposure Index (if provided by the fluoroscopic device): Number of Acquired Spot Images: 0 COMPARISON:  Chest CT without contrast 08/18/2016. FINDINGS: The patient was unable to stand, so a single contrast study was undertaken with the patient inclined at 30 degrees on the fluoroscopy table. No obstruction to the forward flow of contrast throughout the esophagus and into the stomach. Frequent tertiary contractions. No  aspiration was observed. A 12.5 mm barium tablet was administered and passed freely to the midesophagus level. After this the tablet passed slowly into the distal esophagus. It did not pass through the distal most esophagus or gastroesophageal junction despite several additional swallows of water, thick barium, and thin barium. However, no discrete stricture or narrowing in the region is evident (series 12). IMPRESSION: 1. Presbyesophagus. Decreased esophageal motility and frequent tertiary contractions. 2. A 12.5 mm barium tablet did not pass freely through the distal esophagus, however, this seems more related to presbyesophagus and not to any esophageal narrowing/stricture. This could be confirmed with a repeat esophagram once the patient recovers from the acute illness and is able to stand and tolerate a double contrast study. 3. No aspiration was observed. Electronically Signed   By: Genevie Ann M.D.   On: 08/19/2016 13:24        Discharge Exam: Vitals:   08/20/16 2225 08/21/16 0635  BP: (!) 141/81 (!) 148/77  Pulse: 85 90  Resp: 16 16  Temp: 99.1 F (37.3 C) 98.5 F (36.9 C)   Vitals:   08/20/16 1122 08/20/16 1403 08/20/16 2225 08/21/16 0635  BP: (!) 151/88 132/73 (!) 141/81  (!) 148/77  Pulse: (!) 105 84 85 90  Resp:  18 16 16   Temp:  98.9 F (37.2 C) 99.1 F (37.3 C) 98.5 F (36.9 C)  TempSrc:  Oral Oral Oral  SpO2:  99% 95% 99%  Weight:      Height:        General: Pt is alert, awake, not in acute distress Cardiovascular: RRR, S1/S2 +, no rubs, no gallops Respiratory: CTA bilaterally, no wheezing, no rhonchi Abdominal: Soft, NT, ND, bowel sounds + Extremities: no edema, no cyanosis    The results of significant diagnostics from this hospitalization (including imaging, microbiology, ancillary and laboratory) are listed below for reference.        Labs: BNP (last 3 results) No results for input(s): BNP in the last 8760 hours. Basic Metabolic Panel:  Recent Labs Lab 08/18/16 1417 08/18/16 2052 08/19/16 0425 08/20/16 0412 08/21/16 0450  NA 134*  --  135 131* 135  K 3.5  --  3.4* 3.9 4.1  CL 98*  --  102 100* 102  CO2 27  --  27 24 26   GLUCOSE 130*  --  147* 117* 117*  BUN 18  --  12 11 8   CREATININE 0.68 0.64 0.55 0.42* 0.52  CALCIUM 8.3*  --  7.8* 7.6* 7.7*  MG  --   --   --  1.8  --    Liver Function Tests:  Recent Labs Lab 08/18/16 1417  AST 39  ALT 21  ALKPHOS 127*  BILITOT 0.9  PROT 6.2*  ALBUMIN 2.4*   No results for input(s): LIPASE, AMYLASE in the last 168 hours. No results for input(s): AMMONIA in the last 168 hours. CBC:  Recent Labs Lab 08/18/16 1417 08/18/16 2052 08/19/16 0425 08/20/16 0412  WBC 8.5 7.9 6.3 5.7  NEUTROABS 7.2  --   --  4.1  HGB 11.0* 10.0* 9.8* 10.1*  HCT 35.1* 31.4* 31.6* 31.7*  MCV 83.0 82.2 82.9 81.5  PLT 229 213 191 238   Cardiac Enzymes: No results for input(s): CKTOTAL, CKMB, CKMBINDEX, TROPONINI in the last 168 hours. BNP: Invalid input(s): POCBNP CBG: No results for input(s): GLUCAP in the last 168 hours. D-Dimer No results for input(s): DDIMER in the last 72 hours. Hgb A1c  Recent Labs  08/20/16 0412  HGBA1C 6.1*   Lipid Profile No results for input(s): CHOL,  HDL, LDLCALC, TRIG, CHOLHDL, LDLDIRECT in the last 72 hours. Thyroid function studies  Recent Labs  08/18/16 1417  TSH 0.282*   Anemia work up  Recent Labs  08/18/16 1417 08/20/16 0412  VITAMINB12 415  --   FOLATE  --  8.3  FERRITIN 132  --   TIBC 172*  --   IRON 7*  --   RETICCTPCT  --  1.3   Urinalysis    Component Value Date/Time   COLORURINE AMBER (A) 08/18/2016 1417   APPEARANCEUR CLOUDY (A) 08/18/2016 1417   LABSPEC 1.022 08/18/2016 1417   PHURINE 6.0 08/18/2016 1417   GLUCOSEU NEGATIVE 08/18/2016 1417   HGBUR SMALL (A) 08/18/2016 1417   BILIRUBINUR SMALL (A) 08/18/2016 1417   KETONESUR NEGATIVE 08/18/2016 1417   PROTEINUR 30 (A) 08/18/2016 1417   UROBILINOGEN 0.2 11/11/2010 0400   NITRITE POSITIVE (A) 08/18/2016 1417   LEUKOCYTESUR LARGE (A) 08/18/2016 1417   Sepsis Labs Invalid input(s): PROCALCITONIN,  WBC,  LACTICIDVEN Microbiology Recent Results (from the past 240 hour(s))  Urine culture     Status: Abnormal   Collection Time: 08/18/16  2:17 PM  Result Value Ref Range Status   Specimen Description URINE, RANDOM  Final   Special Requests NONE  Final   Culture >=100,000 COLONIES/mL ESCHERICHIA COLI (A)  Final   Report Status 08/21/2016 FINAL  Final   Organism ID, Bacteria ESCHERICHIA COLI (A)  Final      Susceptibility   Escherichia coli - MIC*    AMPICILLIN <=2 SENSITIVE Sensitive     CEFAZOLIN <=4 SENSITIVE Sensitive     CEFTRIAXONE <=1 SENSITIVE Sensitive     CIPROFLOXACIN <=0.25 SENSITIVE Sensitive     GENTAMICIN <=1 SENSITIVE Sensitive     IMIPENEM <=0.25 SENSITIVE Sensitive     NITROFURANTOIN <=16 SENSITIVE Sensitive     TRIMETH/SULFA <=20 SENSITIVE Sensitive     AMPICILLIN/SULBACTAM <=2 SENSITIVE Sensitive     PIP/TAZO <=4 SENSITIVE Sensitive     Extended ESBL NEGATIVE Sensitive     * >=100,000 COLONIES/mL ESCHERICHIA COLI  Culture, blood (routine x 2) Call MD if unable to obtain prior to antibiotics being given     Status: None  (Preliminary result)   Collection Time: 08/18/16  8:52 PM  Result Value Ref Range Status   Specimen Description BLOOD LEFT ARM  Final   Special Requests BOTTLES DRAWN AEROBIC AND ANAEROBIC 5CC  Final   Culture   Final    NO GROWTH 2 DAYS Performed at [facility]    Report Status PENDING  Incomplete  Culture, blood (routine x 2) Call MD if unable to obtain prior to antibiotics being given     Status: None (Preliminary result)   Collection Time: 08/18/16  8:52 PM  Result Value Ref Range Status   Specimen Description BLOOD RIGHT ARM  Final   Special Requests BOTTLES DRAWN AEROBIC AND ANAEROBIC 5CC  Final   Culture   Final    NO GROWTH 2 DAYS Performed at [facility]    Report Status PENDING  Incomplete  MRSA PCR Screening     Status: None   Collection Time: 08/19/16  7:00 PM  Result Value Ref Range Status   MRSA by PCR NEGATIVE NEGATIVE Final    Comment:        The GeneXpert MRSA Assay (FDA approved for NASAL specimens only), is one component of a comprehensive MRSA  colonization surveillance program. It is not intended to diagnose MRSA infection nor to guide or monitor treatment for MRSA infections.   Culture, expectorated sputum-assessment     Status: None   Collection Time: 08/20/16 11:07 AM  Result Value Ref Range Status   Specimen Description SPUTUM  Final   Special Requests NONE  Final   Sputum evaluation   Final    THIS SPECIMEN IS ACCEPTABLE. RESPIRATORY CULTURE REPORT TO FOLLOW.   Report Status 08/20/2016 FINAL  Final  Culture, respiratory (NON-Expectorated)     Status: None (Preliminary result)   Collection Time: 08/20/16 11:07 AM  Result Value Ref Range Status   Specimen Description SPUTUM  Final   Special Requests NONE  Final   Gram Stain   Final    FEW WBC PRESENT,BOTH PMN AND MONONUCLEAR FEW YEAST RARE GRAM POSITIVE COCCI IN PAIRS MODERATE SQUAMOUS EPITHELIAL CELLS PRESENT    Culture   Final    CULTURE REINCUBATED FOR BETTER  GROWTH Performed at [facility]    Report Status PENDING  Incomplete     Time coordinating discharge: Over 30 minutes  SIGNED:   Debbe Odea, MD  [facility] 08/21/2016, 1:24 PM Pager   If 7PM-7AM, please contact night-coverage [email or web address] Password [email or web address]

## 2016-08-21 NOTE — Clinical Social Work Placement (Signed)
   CLINICAL SOCIAL WORK PLACEMENT  NOTE  Date:  08/21/2016  Patient Details  Name: Emily Mills MRN: [id number] Date of Birth: 01-Jun-1931  Clinical Social Work is seeking post-discharge placement for this patient at the [place] level of care (*CSW will initial, date and re-position this form in  chart as items are completed):  Yes   Patient/family provided with [place] Work Department's list of facilities offering this level of care within the geographic area requested by the patient (or if unable, by the patient's family).  Yes   Patient/family informed of their freedom to choose among providers that offer the needed level of care, that participate in Medicare, Medicaid or managed care program needed by the patient, have an available bed and are willing to accept the patient.  Yes   Patient/family informed of [place]'s ownership interest in [facility] and [facility], as well as of the fact that they are under no obligation to receive care at these facilities.  PASRR submitted to EDS on 08/12/16     PASRR number received on 08/19/16     Existing PASRR number confirmed on       FL2 transmitted to all facilities in geographic area requested by pt/family on 08/19/16     FL2 transmitted to all facilities within larger geographic area on       Patient informed that his/her managed care company has contracts with or will negotiate with certain facilities, including the following:        No   Patient/family informed of bed offers received.  Patient chooses bed at [facility]     Physician recommends and patient chooses bed at      Patient to be transferred to [facility] on 08/21/16.  Patient to be transferred to facility by Car- with daughter Otila Kluver     Patient family notified on 08/21/16 of transfer.  Name of family member notified:  Dinah Beers     PHYSICIAN Please sign DNR, Please prepare priority discharge summary,  including medications, Please sign FL2, Please prepare prescriptions     Additional Comment: Ok per MD for d/c today to SNF for rehab.  Daughter requests transfer via car and discussed with RN who feels this would be appropriate for patient.  Notified facility as well. RN to call report and DC summary sent to facility for review. Patient and daughter are agreeable to d/c.  No further questions or concerns verbalized by patient or daughter; CSW services will sign off.  Lorie Phenix. Pauline Good, [place] (weekend coverage)     _______________________________________________ Williemae Area, LCSW 08/21/2016, 1:59 PM

## 2016-08-21 NOTE — Care Management Important Message (Signed)
Important Message  Patient Details  Name: Emily Mills MRN: [id number] Date of Birth: [birth year]   Medicare Important Message Given:  Yes    Erenest Rasher, RN 08/21/2016, 11:22 AM

## 2016-08-21 NOTE — Progress Notes (Signed)
Have made three attempts, without success, to call [place] & give report on Ms Lasyone. There was no answer. Chancey Ringel, [employer]

## 2016-08-21 NOTE — Progress Notes (Signed)
Discharged from floor via w/c for transport to [facility] by car. Daughter & belongings with pt. No changes in assessment. Emily Mills, [employer]

## 2016-08-22 LAB — CULTURE, RESPIRATORY W GRAM STAIN

## 2016-08-22 LAB — CULTURE, RESPIRATORY

## 2016-08-23 ENCOUNTER — Non-Acute Institutional Stay (SKILLED_NURSING_FACILITY): Payer: Medicare Other | Admitting: Adult Health

## 2016-08-23 ENCOUNTER — Encounter: Payer: Self-pay | Admitting: Adult Health

## 2016-08-23 DIAGNOSIS — I10 Essential (primary) hypertension: Secondary | ICD-10-CM | POA: Diagnosis not present

## 2016-08-23 DIAGNOSIS — B370 Candidal stomatitis: Secondary | ICD-10-CM

## 2016-08-23 DIAGNOSIS — K5901 Slow transit constipation: Secondary | ICD-10-CM

## 2016-08-23 DIAGNOSIS — D638 Anemia in other chronic diseases classified elsewhere: Secondary | ICD-10-CM

## 2016-08-23 DIAGNOSIS — R627 Adult failure to thrive: Secondary | ICD-10-CM

## 2016-08-23 DIAGNOSIS — N390 Urinary tract infection, site not specified: Secondary | ICD-10-CM | POA: Diagnosis not present

## 2016-08-23 DIAGNOSIS — J181 Lobar pneumonia, unspecified organism: Secondary | ICD-10-CM | POA: Diagnosis not present

## 2016-08-23 DIAGNOSIS — B001 Herpesviral vesicular dermatitis: Secondary | ICD-10-CM | POA: Diagnosis not present

## 2016-08-23 DIAGNOSIS — K228 Other specified diseases of esophagus: Secondary | ICD-10-CM

## 2016-08-23 DIAGNOSIS — J309 Allergic rhinitis, unspecified: Secondary | ICD-10-CM | POA: Diagnosis not present

## 2016-08-23 DIAGNOSIS — R531 Weakness: Secondary | ICD-10-CM | POA: Diagnosis not present

## 2016-08-23 DIAGNOSIS — K2289 Other specified disease of esophagus: Secondary | ICD-10-CM

## 2016-08-23 LAB — CULTURE, BLOOD (ROUTINE X 2)
CULTURE: NO GROWTH
Culture: NO GROWTH

## 2016-08-23 NOTE — Progress Notes (Signed)
DATE:  08/23/2016   MRN:  [id number]  BIRTHDAY: 15-Nov-1930  Facility:  Nursing Home Location:  [place] Room Number: 408-P  LEVEL OF CARE:  SNF (31)  Contact Information    Name Relation Home Work Playita [place] Daughter [phone]  [phone]       Code Status History    Date Active Date Inactive Code Status Order ID Comments User Context   08/19/2016  1:02 PM 08/21/2016  5:38 PM DNR [id number]  Florencia Reasons, MD Inpatient   08/18/2016  8:36 PM 08/19/2016  1:01 PM Full Code [id number]  Louellen Molder, MD Inpatient    Questions for Most Recent Historical Code Status (Order [id number])    Question Answer Comment   In the event of cardiac or respiratory ARREST Do not call a "code blue"    In the event of cardiac or respiratory ARREST Do not perform Intubation, CPR, defibrillation or ACLS    In the event of cardiac or respiratory ARREST Use medication by any route, position, wound care, and other measures to relive pain and suffering. May use oxygen, suction and manual treatment of airway obstruction as needed for comfort.         Advance Directive Documentation   Flowsheet Row Most Recent Value  Type of Advance Directive  Healthcare Power of Attorney, Living will, Out of facility DNR (pink MOST or yellow form)  Pre-existing out of facility DNR order (yellow form or pink MOST form)  No data  "MOST" Form in Place?  No data       Chief Complaint  Patient presents with  . Hospitalization Follow-up    HISTORY OF PRESENT ILLNESS:  This is an 82 year old female who has PMH of hypertension and prediabetes. She has been admitted to [facility] on 08/21/16 from [facility] hospitalization 08/18/16 to 08/21/16. She was having multiple falls in the [facility] home and was sent to the hospital from her PCPs office. She was found to have LUL pneumonia and UTI. She was started on Rocephin and Zithromax, switched to Vantin 7 days, then  Zithromax for a total of 5 days. Sputum and blood cultures were unrevealing and strep antigen negative.  She has been admitted for a short-term rehabilitation.   PAST MEDICAL HISTORY:  Past Medical History:  Diagnosis Date  . Dehydration, mild 08/2016  . FTT (failure to thrive) in adult 08/2016  . Hypertension   . Hyponatremia 08/2016  . Lobar pneumonia, unspecified organism ([place]) 08/2016  . Pre-diabetes   . SIRS (systemic inflammatory response syndrome) ([place]) 08/2016  . Skin cancer   . UTI (urinary tract infection) 08/2016     CURRENT MEDICATIONS: Reviewed  Patient's Medications  New Prescriptions   No medications on file  Previous Medications   ACETAMINOPHEN (TYLENOL) 325 MG TABLET    Take 650 mg by mouth every 4 (four) hours as needed for mild pain.   AMLODIPINE (NORVASC) 2.5 MG TABLET    Take 2.5 mg by mouth daily.    AZITHROMYCIN (ZITHROMAX) 500 MG TABLET    Take 500 mg by mouth daily.   CEFPODOXIME (VANTIN) 100 MG TABLET    Take 100 mg by mouth 2 (two) times daily.   ELASTIC BANDAGES & SUPPORTS (T.E.D. BELOW KNEE/M-REGULAR) MISC    On in AM and off in PM. Medical supply store to measure and give appropriate size. Moderate compression.   FAMOTIDINE (PEPCID) 20 MG TABLET    Take 1  tablet (20 mg total) by mouth daily.   GUAIFENESIN (MUCINEX) 600 MG 12 HR TABLET    Take 1 tablet (600 mg total) by mouth 2 (two) times daily as needed.   LOPERAMIDE (IMODIUM) 2 MG CAPSULE    Take 2 mg by mouth 4 (four) times daily as needed for diarrhea or loose stools.   MENTHOL-ZINC OXIDE (GOLD BOND) POWDER    Apply 1 application topically daily.   MULTIPLE VITAMINS-MINERALS (DAILY MULTI 50+ PO)    Take 1 tablet by mouth daily.   NYSTATIN (MYCOSTATIN) 100000 UNIT/ML SUSPENSION    Take 5 mLs (500,000 Units total) by mouth 4 (four) times daily.   NYSTATIN CREAM (MYCOSTATIN)    Apply 1 application topically daily as needed for irritation or rash.   SENNA-DOCUSATE (SENOKOT-S) 8.6-50 MG TABLET    Take  1 tablet by mouth at bedtime as needed for mild constipation.  Modified Medications   No medications on file  Discontinued Medications   AZITHROMYCIN (ZITHROMAX) 500 MG TABLET    Take 1 tablet (500 mg total) by mouth daily.   CEFPODOXIME (VANTIN) 100 MG TABLET    Take 1 tablet (100 mg total) by mouth 2 (two) times daily.     No Known Allergies   REVIEW OF SYSTEMS:  GENERAL: no change in appetite, no fatigue, no weight changes, no fever, chills or weakness EYES: Denies change in vision, dry eyes, eye pain, itching or discharge EARS: Denies change in hearing, ringing in ears, or earache NOSE: Denies nasal congestion or epistaxis MOUTH and THROAT: Denies oral discomfort, gingival pain or bleeding, pain from teeth or hoarseness   RESPIRATORY: no cough, SOB, DOE, wheezing, hemoptysis CARDIAC: no chest pain, edema or palpitations GI: no abdominal pain, diarrhea, constipation, heart burn, nausea or vomiting GU: Denies dysuria, frequency, hematuria, incontinence, or discharge PSYCHIATRIC: Denies feeling of depression or anxiety. No report of hallucinations, insomnia, paranoia, or agitation    PHYSICAL EXAMINATION  GENERAL APPEARANCE: Well nourished. In no acute distress. Normal body habitus SKIN:  Skin is dry.  HEAD: Normal in size Skin is warm anand contour. No evidence of trauma EYES: Lids open and close normally. No blepharitis, entropion or ectropion. PERRL. Conjunctivae are clear and sclerae are white. Lenses are without opacity EARS: Pinnae are normal. Patient hears normal voice tunes of the examiner MOUTH and THROAT: Upper lip has lesions. Oral mucosa is moist and without lesions. Tongue has erythematous lesions NECK: supple, trachea midline, no neck masses, no thyroid tenderness, no thyromegaly LYMPHATICS: no LAN in the neck, no supraclavicular LAN RESPIRATORY: breathing is even & unlabored, BS CTAB CARDIAC: RRR, no murmur,no extra heart sounds, BLE trace edema GI: abdomen  soft, normal BS, no masses, no tenderness, no hepatomegaly, no splenomegaly EXTREMITIES:  Able to move 4 extremities PSYCHIATRIC: Alert and oriented X 3. Affect and behavior are appropriate  LABS/RADIOLOGY: Labs reviewed: Basic Metabolic Panel:  Recent Labs  08/19/16 0425 08/20/16 0412 08/21/16 0450  NA 135 131* 135  K 3.4* 3.9 4.1  CL 102 100* 102  CO2 27 24 26   GLUCOSE 147* 117* 117*  BUN 12 11 8   CREATININE 0.55 0.42* 0.52  CALCIUM 7.8* 7.6* 7.7*  MG  --  1.8  --    Liver Function Tests:  Recent Labs  08/18/16 1417  AST 39  ALT 21  ALKPHOS 127*  BILITOT 0.9  PROT 6.2*  ALBUMIN 2.4*   CBC:  Recent Labs  08/18/16 1417 08/18/16 2052 08/19/16 0425 08/20/16 [id number]  WBC 8.5 7.9 6.3 5.7  NEUTROABS 7.2  --   --  4.1  HGB 11.0* 10.0* 9.8* 10.1*  HCT 35.1* 31.4* 31.6* 31.7*  MCV 83.0 82.2 82.9 81.5  PLT 229 213 191 238      Dg Chest 2 View  Result Date: 08/18/2016 CLINICAL DATA:  History of a fall 2 days ago.  Chest pain. EXAM: CHEST  2 VIEW COMPARISON:  11/11/2010 FINDINGS: The cardiac silhouette, mediastinal and hilar contours are within normal limits. There is tortuosity and calcification of the thoracic aorta. Airspace consolidation noted in the left upper lobe and also in the left lower lobe. These could represent infiltrates, aspiration or pulmonary contusions. Underline emphysematous changes and pulmonary scarring. No definite pleural effusions. No pneumothorax. The bony thorax is intact. No definite rib fractures are identified. IMPRESSION: Left upper lobe and left lower airspace opacities could reflect pneumonia/aspiration or pulmonary contusion. Followup PA and lateral chest X-ray is recommended in 3-4 weeks following trial of antibiotic therapy to ensure resolution and exclude underlying malignancy. Electronically Signed   By: Marijo Sanes M.D.   On: 08/18/2016 15:31   Ct Head Wo Contrast  Result Date: 08/18/2016 CLINICAL DATA:  Witnessed fall from  standing position this morning. Increased falls and decreased mobility recently. Initial encounter. EXAM: CT HEAD WITHOUT CONTRAST TECHNIQUE: Contiguous axial images were obtained from the base of the skull through the vertex without intravenous contrast. COMPARISON:  None. FINDINGS: Brain: There is no evidence of acute cortical infarct, intracranial hemorrhage, mass, midline shift, or extra-axial fluid collection. Mild cerebral atrophy is within normal limits for age. Periventricular white matter hypodensities are nonspecific but compatible with mild chronic small vessel ischemic disease. There is a lacunar infarct along the lateral margin of the left thalamus, chronic in appearance. Vascular: Calcified atherosclerosis at the skullbase. No hyperdense vessel. Skull: Hyperostosis frontalis interna. No fracture or destructive osseous lesion. Sinuses/Orbits: Minimal anterior left ethmoid air cell mucosal thickening. Clear mastoid air cells. Visualized orbits are unremarkable. Other: None. IMPRESSION: 1. No evidence of acute intracranial abnormality. 2. Mild chronic small vessel ischemic disease. Electronically Signed   By: Logan Bores M.D.   On: 08/18/2016 16:02   Ct Chest Wo Contrast  Result Date: 08/18/2016 CLINICAL DATA:  Possible pneumonia.  Fall 2 days ago. EXAM: CT CHEST WITHOUT CONTRAST TECHNIQUE: Multidetector CT imaging of the chest was performed following the standard protocol without IV contrast. COMPARISON:  Chest x-ray from earlier today FINDINGS: Cardiovascular: No cardiomegaly. Small anterior pericardial effusion. Extensive mitral annular calcification. Coronary atherosclerosis. Atherosclerotic calcification. Mediastinum/Nodes: Debris in the esophagus without detectable wall thickening. Negative for adenopathy. Goiter with dystrophic calcifications in the right lobe. Lungs/Pleura: Dense consolidation in the posterior segment left upper lobe correlating with chest x-ray findings. No evidence of  airway debris. No pneumatocele, hemothorax, or pneumothorax. Trace left pleural effusion. Upper Abdomen: Hepatic steatosis chronic. Benign low-density lesion in the upper spleen, stable from 2012. Musculoskeletal: Spondylosis.  No acute or aggressive finding. IMPRESSION: 1. Segmental left upper lobe pneumonia. Followup PA and lateral chest X-ray is recommended 3 to 4 weeks following trial of antibiotic therapy to ensure resolution. 2. No posttraumatic finding. 3. Reflux or stasis in the esophagus. 4. Small anterior pericardial effusion. 5. Hepatic steatosis. Electronically Signed   By: Monte Fantasia M.D.   On: 08/18/2016 16:34   Mr Lumbar Spine Wo Contrast  Result Date: 08/20/2016 CLINICAL DATA:  Frequent falls, bilateral lower extremity weakness and incontinence. EXAM: MRI LUMBAR SPINE WITHOUT CONTRAST TECHNIQUE: Multiplanar,  multisequence MR imaging of the lumbar spine was performed. No intravenous contrast was administered. Patient is unable to tolerate further imaging, axial T1 sequence not obtained. COMPARISON:  CT abdomen and pelvis November 11, 2010 FINDINGS: Moderately motion degraded examination. Patient discontinued the examination prior to axial T1 sequence. SEGMENTATION: For the purposes of this report, the last well-formed intervertebral disc will be described as L5-S1. ALIGNMENT: Maintenance of the lumbar lordosis. No malalignment. VERTEBRAE:Vertebral bodies are intact. Severe L4-5 and moderate to severe L5-S1 disc height loss. Remaining disc morphology preserved. Decreased T2 signal within all disc compatible with mild desiccation. Moderate L4-5 and L5-S1 subacute on chronic discogenic endplate changes. Mild L1-2, L2-3 ventral endplate spurring. Mild subacute discogenic endplate changes [id number]. No suspicious bone marrow signal. CONUS MEDULLARIS: Conus medullaris terminates at L1-2 and demonstrates normal morphology and signal characteristics. Cauda equina is normal. PARASPINAL AND SOFT TISSUES:  Included prevertebral and paraspinal soft tissues are nonacute. 3.7 cm benign-appearing RIGHT lower pole renal cyst. Moderate symmetric paraspinal muscle atrophy. Abnormal signal LEFT S3 nerve root may represent Tarlov cysts, incompletely characterized. DISC LEVELS (limited assessment: Lack of axial T1, moderately motion degraded axial T2): T12-L1: No disc bulge, canal stenosis nor neural foraminal narrowing. L1-2: Small LEFT subarticular to extra foraminal disc protrusion. No canal stenosis or neural foraminal narrowing. L2-3:  No disc bulge, canal stenosis nor neural foraminal narrowing. L3-4: Small broad-based disc bulge. Moderate LEFT extra foraminal disc protrusion. Mild facet arthropathy and ligamentum flavum redundancy. No canal stenosis. Mild RIGHT, mild to moderate LEFT neural foraminal narrowing. L4-5: Moderate broad-based disc bulge, LEFT central disc extrusion with 8 mm of contiguous inferior migration, difficult to characterize due to patient motion. Moderate facet arthropathy and ligamentum flavum redundancy. Mild canal stenosis. Moderate to severe bilateral neural foraminal narrowing. L5-S1: Moderate LEFT central broad-based disc protrusion. Mild RIGHT, moderate LEFT facet arthropathy. No canal stenosis though partial effacement LEFT lateral recess could affect the traversing LEFT S1 nerve. Mild to moderate RIGHT, moderate LEFT neural foraminal narrowing. IMPRESSION: Motion degraded, limited examination. No acute fracture or malalignment. Mild canal stenosis L4-5, partial lateral recess effacement L5-S1 could affect the traversing LEFT S1 nerve. Small L4-5 disc extrusion. Neural foraminal narrowing L3-4 through L5-S1: Moderate to severe bilaterally at L4-5. Electronically Signed   By: Elon Alas M.D.   On: 08/20/2016 19:45   US Abdomen Complete  Result Date: 07/29/2016 CLINICAL DATA:  Elevated liver function studies, bloating, increased gas. 15 pound weight loss over the past 6 months.  Previous cholecystectomy. EXAM: ABDOMEN ULTRASOUND COMPLETE COMPARISON:  Abdominal pelvic CT scan of November 11, 2010. FINDINGS: Gallbladder: The gallbladder is surgically absent. Common bile duct: Diameter: 8.8 mm Liver: The hepatic echotexture is mildly increased and slightly heterogeneous. There is no focal mass nor ductal dilation. There is a left lobe cyst measuring 11 mm in diameter which is been previously demonstrated. IVC: Bowel gas limits visualization of the IVC. Pancreas: Bowel gas limits visualization of the pancreas. Spleen: There is splenomegaly. Spleen measures 16.4 x 14.7 x 4.3 cm with a calculated volume of 539 cc. There is a stable cyst near the dome of the spleen measuring 1 cm in diameter. Right Kidney: Length: 11.3 cm. The cortical echotexture remains lower than that of the liver. There is no hydronephrosis. There is a lower pole cyst measuring 3.3 x 3.6 x 4 cm. Left Kidney: Length: 10.9 cm. Echogenicity within normal limits. No mass or hydronephrosis visualized. Abdominal aorta: Visualization of the abdominal aorta is limited due to bowel  gas. Other findings: There is no ascites. IMPRESSION: 1. Common bile duct dilation to 8.8 mm is not abnormal post cholecystectomy. Fatty infiltrative change of the liver. Mild splenomegaly with calculated volume of 539 cc. Simple appearing 1 cm diameter splenic cyst. 2. No hydronephrosis or evidence of stones. Simple appearing lower pole cyst in the right kidney. 3. No ascites. Electronically Signed   By: David  Martinique M.D.   On: 07/29/2016 10:00   Dg Esophagus  Result Date: 08/19/2016 CLINICAL DATA:  82 year old female with pneumonia, acid reflux. Initial encounter. EXAM: ESOPHOGRAM / BARIUM SWALLOW / BARIUM TABLET STUDY TECHNIQUE: Combined double contrast and single contrast examination performed using thick barium liquid and thin barium liquid. The patient was observed with fluoroscopy swallowing a 13 mm barium sulphate tablet. FLUOROSCOPY TIME:   Fluoroscopy Time:  1 minutes 12 seconds Radiation Exposure Index (if provided by the fluoroscopic device): Number of Acquired Spot Images: 0 COMPARISON:  Chest CT without contrast 08/18/2016. FINDINGS: The patient was unable to stand, so a single contrast study was undertaken with the patient inclined at 30 degrees on the fluoroscopy table. No obstruction to the forward flow of contrast throughout the esophagus and into the stomach. Frequent tertiary contractions. No aspiration was observed. A 12.5 mm barium tablet was administered and passed freely to the midesophagus level. After this the tablet passed slowly into the distal esophagus. It did not pass through the distal most esophagus or gastroesophageal junction despite several additional swallows of water, thick barium, and thin barium. However, no discrete stricture or narrowing in the region is evident (series 12). IMPRESSION: 1. Presbyesophagus. Decreased esophageal motility and frequent tertiary contractions. 2. A 12.5 mm barium tablet did not pass freely through the distal esophagus, however, this seems more related to presbyesophagus and not to any esophageal narrowing/stricture. This could be confirmed with a repeat esophagram once the patient recovers from the acute illness and is able to stand and tolerate a double contrast study. 3. No aspiration was observed. Electronically Signed   By: Genevie Ann M.D.   On: 08/19/2016 13:24    ASSESSMENT/PLAN:  Generalized weakness - for rehabilitation, PT and OT, for therapeutic strengthening exercises; fall precaution  Pneumonia, LUL - chest x-ray showed LUL pneumonia; she was started on Rocephin and Zithromax, switched to Vantin 7 days, then Zithromax for a total of 5 days  UTI - pansensitive Escherichia coli; she was started on Rocephin and Zithromax, switched to Vantin 7 days, then Zithromax for a total of 5 days  Presbyesophagus - CT showed reflux versus stasis in esophagus; esophagram showed  presbyesophagram; decreased esophageal motility and frequent tertiary contractions; was started on Pepcid 20 mg 1 tab by mouth daily: SLP evaluation regarding  Hypertension - continue amlodipine 2.5 mg 1 tab by mouth daily; check BMP  Anemia of chronic disease - check CBC Lab Results  Component Value Date   HGB 10.1 (L) 08/20/2016   Failure to thrive - with frequent falls and weight loss; CT head with no acute findings; MRI lumbar spine on 12/1 showed neural foraminal narrowing L3-4 through L5-S1, moderate to severe bilaterally at L4-5  Oral thrush - continue nystatin suspension 100,000 units/mL hip 5 ML 4 times a day  Constipation - continue senna S 8.6/50 mg 1 tab by mouth daily at bedtime when necessary  Allergic rhinitis - start Flonase 50 g 1 spray into both nostrils daily   Fever blister - start Abreva 10% cream to upper lips lesions 5 times/day 10 days  Goals of care:  Short-term rehabilitation   Durenda Age - NP [facility] [phone]

## 2016-08-24 ENCOUNTER — Non-Acute Institutional Stay (SKILLED_NURSING_FACILITY): Payer: Medicare Other | Admitting: Internal Medicine

## 2016-08-24 ENCOUNTER — Encounter: Payer: Self-pay | Admitting: Internal Medicine

## 2016-08-24 DIAGNOSIS — B001 Herpesviral vesicular dermatitis: Secondary | ICD-10-CM | POA: Diagnosis not present

## 2016-08-24 DIAGNOSIS — D638 Anemia in other chronic diseases classified elsewhere: Secondary | ICD-10-CM

## 2016-08-24 DIAGNOSIS — R5381 Other malaise: Secondary | ICD-10-CM | POA: Diagnosis not present

## 2016-08-24 DIAGNOSIS — K219 Gastro-esophageal reflux disease without esophagitis: Secondary | ICD-10-CM | POA: Diagnosis not present

## 2016-08-24 DIAGNOSIS — B370 Candidal stomatitis: Secondary | ICD-10-CM | POA: Diagnosis not present

## 2016-08-24 DIAGNOSIS — N390 Urinary tract infection, site not specified: Secondary | ICD-10-CM

## 2016-08-24 DIAGNOSIS — J309 Allergic rhinitis, unspecified: Secondary | ICD-10-CM

## 2016-08-24 DIAGNOSIS — I10 Essential (primary) hypertension: Secondary | ICD-10-CM | POA: Diagnosis not present

## 2016-08-24 DIAGNOSIS — J189 Pneumonia, unspecified organism: Secondary | ICD-10-CM

## 2016-08-24 DIAGNOSIS — R600 Localized edema: Secondary | ICD-10-CM

## 2016-08-24 DIAGNOSIS — B9620 Unspecified Escherichia coli [E. coli] as the cause of diseases classified elsewhere: Secondary | ICD-10-CM

## 2016-08-24 DIAGNOSIS — J181 Lobar pneumonia, unspecified organism: Secondary | ICD-10-CM

## 2016-08-24 LAB — BASIC METABOLIC PANEL
BUN: 7 mg/dL (ref 4–21)
CREATININE: 0.5 mg/dL (ref 0.5–1.1)
Glucose: 109 mg/dL
Potassium: 4.1 mmol/L (ref 3.4–5.3)
Sodium: 139 mmol/L (ref 137–147)

## 2016-08-24 LAB — CBC AND DIFFERENTIAL
HCT: 35 % — AB (ref 36–46)
Hemoglobin: 10.8 g/dL — AB (ref 12.0–16.0)
NEUTROS ABS: 4 /uL
PLATELETS: 348 10*3/uL (ref 150–399)
WBC: 6.2 10*3/mL

## 2016-08-24 NOTE — Progress Notes (Signed)
LOCATION: [facility]  PCP: London Pepper, MD   Code Status: DNR  Goals of care: Advanced Directive information Advanced Directives 08/23/2016  Does Patient Have a Medical Advance Directive? Yes  Type of Paramedic of [place];Living will;Out of facility DNR (pink MOST or yellow form)  Does patient want to make changes to medical advance directive? No - Patient declined  Copy of [place] in Chart? Yes  Would patient like information on creating a medical advance directive? No - Patient declined       Extended Emergency Contact Information Primary Emergency Contact: Headden,Tina Address: [address]          [place], [place] [address] Johnnette Litter of [place] Phone: [phone] Mobile Phone: [phone] Relation: Daughter Secondary Emergency Contact: Lurena Nida [place] of [place] Mobile Phone: [phone] Relation: Other   No Known Allergies  Chief Complaint  Patient presents with  . New Admit To SNF    New Admission Visit     HPI:  Patient is a 82 y.o. female seen today for short term rehabilitation post hospital admission from 08/18/2016-08/21/2016 with acute hypoxic respiratory failure from left lobar pneumonia and Escherichia coli UTI with sepsis. She was started on antibiotics. She has medical history of hypertension and prediabetes. She is seen in her room today  Review of Systems:  Constitutional: Negative for fever, chills  HENT: Negative for headache, congestion. Positive for nasal discharge.   Eyes: Negative for blurred vision, double vision and discharge.  Respiratory: Negative for shortness of breath and wheezing. Positive for occasional cough with yellow phlegm.   Cardiovascular: Negative for chest pain, palpitations, leg swelling.  Gastrointestinal: Negative for heartburn, nausea, vomiting, abdominal pain. Last bowel movement was yesterday. Genitourinary: Positive for increased urinary  frequency.  Musculoskeletal: Negative for back pain, fall in the facility.  Skin: Negative for itching, rash.  Neurological: Positive for occasional dizziness with change of position. Psychiatric/Behavioral: Negative for depression   Past Medical History:  Diagnosis Date  . Dehydration, mild 08/2016  . FTT (failure to thrive) in adult 08/2016  . Hypertension   . Hyponatremia 08/2016  . Lobar pneumonia, unspecified organism ([facility]) 08/2016  . Pre-diabetes   . SIRS (systemic inflammatory response syndrome) ([facility]) 08/2016  . Skin cancer   . UTI (urinary tract infection) 08/2016   Past Surgical History:  Procedure Laterality Date  . CHOLECYSTECTOMY     30 years ago   Social History:   reports that she has never smoked. She has never used smokeless tobacco. She reports that she does not drink alcohol or use drugs.  No family history on file.  Medications:   Medication List       Accurate as of 08/24/16  3:02 PM. Always use your most recent med list.          ABREVA 10 % Crea Generic drug:  Docosanol Apply 1 application topically daily. Apply to upper lip rash. Stop date 09/03/16   acetaminophen 325 MG tablet Commonly known as:  TYLENOL Take 650 mg by mouth every 4 (four) hours as needed for mild pain.   amLODipine 2.5 MG tablet Commonly known as:  NORVASC Take 2.5 mg by mouth daily.   azithromycin 500 MG tablet Commonly known as:  ZITHROMAX Take 500 mg by mouth daily.   cefpodoxime 100 MG tablet Commonly known as:  VANTIN Take 100 mg by mouth 2 (two) times daily.   DAILY MULTI 50+ PO Take 1 tablet by  mouth daily.   famotidine 20 MG tablet Commonly known as:  PEPCID Take 1 tablet (20 mg total) by mouth daily.   fluticasone 50 MCG/ACT nasal spray Commonly known as:  FLONASE Place into both nostrils daily.   guaiFENesin 600 MG 12 hr tablet Commonly known as:  MUCINEX Take 1 tablet (600 mg total) by mouth 2 (two) times daily as needed.   loperamide 2 MG  capsule Commonly known as:  IMODIUM Take 2 mg by mouth 4 (four) times daily as needed for diarrhea or loose stools.   menthol-zinc oxide powder Apply 1 application topically daily.   nystatin 100000 UNIT/ML suspension Commonly known as:  MYCOSTATIN Take 5 mLs (500,000 Units total) by mouth 4 (four) times daily.   nystatin cream Commonly known as:  MYCOSTATIN Apply 1 application topically daily as needed for irritation or rash.   senna-docusate 8.6-50 MG tablet Commonly known as:  Senokot-S Take 1 tablet by mouth at bedtime as needed for mild constipation.   simethicone 125 MG chewable tablet Commonly known as:  MYLICON Chew 0000000 mg by mouth every 6 (six) hours as needed for flatulence.   T.E.D. BELOW KNEE/M-REGULAR Misc On in AM and off in PM. Medical supply store to measure and give appropriate size. Moderate compression.       Immunizations:  There is no immunization history on file for this patient.   Physical Exam: Vitals:   08/24/16 1155  BP: 134/80  Pulse: 89  Resp: 18  Temp: 98.9 F (37.2 C)  TempSrc: Oral  SpO2: 96%  Weight: 153 lb (69.4 kg)  Height: 5\' 1"  (1.549 m)   Body mass index is 28.91 kg/m.  General- elderly female, in no acute distress Head- normocephalic, atraumatic Nose- no maxillary or frontal sinus tenderness, no nasal discharge Throat- moist mucus membrane, White coating to her tongue, no mouth sores, has fever blisters Eyes- PERRLA, EOMI, no pallor, no icterus Neck- no cervical lymphadenopathy Cardiovascular- normal s1,s2, no murmur Respiratory- bilateral clear to auscultation, no wheeze, no rhonchi, no crackles, no use of accessory muscles Abdomen- bowel sounds present, soft, non tender Musculoskeletal- able to move all 4 extremities, generalized weakness, trace leg edema Neurological- alert and oriented to person, place and time Skin- warm and dry Psychiatry- normal mood and affect    Labs reviewed: Basic Metabolic  Panel:  Recent Labs  08/19/16 0425 08/20/16 0412 08/21/16 0450  NA 135 131* 135  K 3.4* 3.9 4.1  CL 102 100* 102  CO2 27 24 26   GLUCOSE 147* 117* 117*  BUN 12 11 8   CREATININE 0.55 0.42* 0.52  CALCIUM 7.8* 7.6* 7.7*  MG  --  1.8  --    Liver Function Tests:  Recent Labs  08/18/16 1417  AST 39  ALT 21  ALKPHOS 127*  BILITOT 0.9  PROT 6.2*  ALBUMIN 2.4*   No results for input(s): LIPASE, AMYLASE in the last 8760 hours. No results for input(s): AMMONIA in the last 8760 hours. CBC:  Recent Labs  08/18/16 1417 08/18/16 2052 08/19/16 0425 08/20/16 0412  WBC 8.5 7.9 6.3 5.7  NEUTROABS 7.2  --   --  4.1  HGB 11.0* 10.0* 9.8* 10.1*  HCT 35.1* 31.4* 31.6* 31.7*  MCV 83.0 82.2 82.9 81.5  PLT 229 213 191 238   Cardiac Enzymes: No results for input(s): CKTOTAL, CKMB, CKMBINDEX, TROPONINI in the last 8760 hours. BNP: Invalid input(s): POCBNP CBG: No results for input(s): GLUCAP in the last 8760 hours.  Radiological Exams:  Dg Chest 2 View  Result Date: 08/18/2016 CLINICAL DATA:  History of a fall 2 days ago.  Chest pain. EXAM: CHEST  2 VIEW COMPARISON:  11/11/2010 FINDINGS: The cardiac silhouette, mediastinal and hilar contours are within normal limits. There is tortuosity and calcification of the thoracic aorta. Airspace consolidation noted in the left upper lobe and also in the left lower lobe. These could represent infiltrates, aspiration or pulmonary contusions. Underline emphysematous changes and pulmonary scarring. No definite pleural effusions. No pneumothorax. The bony thorax is intact. No definite rib fractures are identified. IMPRESSION: Left upper lobe and left lower airspace opacities could reflect pneumonia/aspiration or pulmonary contusion. Followup PA and lateral chest X-ray is recommended in 3-4 weeks following trial of antibiotic therapy to ensure resolution and exclude underlying malignancy. Electronically Signed   By: Marijo Sanes M.D.   On: 08/18/2016  15:31   Ct Head Wo Contrast  Result Date: 08/18/2016 CLINICAL DATA:  Witnessed fall from standing position this morning. Increased falls and decreased mobility recently. Initial encounter. EXAM: CT HEAD WITHOUT CONTRAST TECHNIQUE: Contiguous axial images were obtained from the base of the skull through the vertex without intravenous contrast. COMPARISON:  None. FINDINGS: Brain: There is no evidence of acute cortical infarct, intracranial hemorrhage, mass, midline shift, or extra-axial fluid collection. Mild cerebral atrophy is within normal limits for age. Periventricular white matter hypodensities are nonspecific but compatible with mild chronic small vessel ischemic disease. There is a lacunar infarct along the lateral margin of the left thalamus, chronic in appearance. Vascular: Calcified atherosclerosis at the skullbase. No hyperdense vessel. Skull: Hyperostosis frontalis interna. No fracture or destructive osseous lesion. Sinuses/Orbits: Minimal anterior left ethmoid air cell mucosal thickening. Clear mastoid air cells. Visualized orbits are unremarkable. Other: None. IMPRESSION: 1. No evidence of acute intracranial abnormality. 2. Mild chronic small vessel ischemic disease. Electronically Signed   By: Logan Bores M.D.   On: 08/18/2016 16:02   Ct Chest Wo Contrast  Result Date: 08/18/2016 CLINICAL DATA:  Possible pneumonia.  Fall 2 days ago. EXAM: CT CHEST WITHOUT CONTRAST TECHNIQUE: Multidetector CT imaging of the chest was performed following the standard protocol without IV contrast. COMPARISON:  Chest x-ray from earlier today FINDINGS: Cardiovascular: No cardiomegaly. Small anterior pericardial effusion. Extensive mitral annular calcification. Coronary atherosclerosis. Atherosclerotic calcification. Mediastinum/Nodes: Debris in the esophagus without detectable wall thickening. Negative for adenopathy. Goiter with dystrophic calcifications in the right lobe. Lungs/Pleura: Dense consolidation in  the posterior segment left upper lobe correlating with chest x-ray findings. No evidence of airway debris. No pneumatocele, hemothorax, or pneumothorax. Trace left pleural effusion. Upper Abdomen: Hepatic steatosis chronic. Benign low-density lesion in the upper spleen, stable from 2012. Musculoskeletal: Spondylosis.  No acute or aggressive finding. IMPRESSION: 1. Segmental left upper lobe pneumonia. Followup PA and lateral chest X-ray is recommended 3 to 4 weeks following trial of antibiotic therapy to ensure resolution. 2. No posttraumatic finding. 3. Reflux or stasis in the esophagus. 4. Small anterior pericardial effusion. 5. Hepatic steatosis. Electronically Signed   By: Monte Fantasia M.D.   On: 08/18/2016 16:34   Mr Lumbar Spine Wo Contrast  Result Date: 08/20/2016 CLINICAL DATA:  Frequent falls, bilateral lower extremity weakness and incontinence. EXAM: MRI LUMBAR SPINE WITHOUT CONTRAST TECHNIQUE: Multiplanar, multisequence MR imaging of the lumbar spine was performed. No intravenous contrast was administered. Patient is unable to tolerate further imaging, axial T1 sequence not obtained. COMPARISON:  CT abdomen and pelvis November 11, 2010 FINDINGS: Moderately motion degraded examination. Patient discontinued the examination  prior to axial T1 sequence. SEGMENTATION: For the purposes of this report, the last well-formed intervertebral disc will be described as L5-S1. ALIGNMENT: Maintenance of the lumbar lordosis. No malalignment. VERTEBRAE:Vertebral bodies are intact. Severe L4-5 and moderate to severe L5-S1 disc height loss. Remaining disc morphology preserved. Decreased T2 signal within all disc compatible with mild desiccation. Moderate L4-5 and L5-S1 subacute on chronic discogenic endplate changes. Mild L1-2, L2-3 ventral endplate spurring. Mild subacute discogenic endplate changes [id number]. No suspicious bone marrow signal. CONUS MEDULLARIS: Conus medullaris terminates at L1-2 and demonstrates normal  morphology and signal characteristics. Cauda equina is normal. PARASPINAL AND SOFT TISSUES: Included prevertebral and paraspinal soft tissues are nonacute. 3.7 cm benign-appearing RIGHT lower pole renal cyst. Moderate symmetric paraspinal muscle atrophy. Abnormal signal LEFT S3 nerve root may represent Tarlov cysts, incompletely characterized. DISC LEVELS (limited assessment: Lack of axial T1, moderately motion degraded axial T2): T12-L1: No disc bulge, canal stenosis nor neural foraminal narrowing. L1-2: Small LEFT subarticular to extra foraminal disc protrusion. No canal stenosis or neural foraminal narrowing. L2-3:  No disc bulge, canal stenosis nor neural foraminal narrowing. L3-4: Small broad-based disc bulge. Moderate LEFT extra foraminal disc protrusion. Mild facet arthropathy and ligamentum flavum redundancy. No canal stenosis. Mild RIGHT, mild to moderate LEFT neural foraminal narrowing. L4-5: Moderate broad-based disc bulge, LEFT central disc extrusion with 8 mm of contiguous inferior migration, difficult to characterize due to patient motion. Moderate facet arthropathy and ligamentum flavum redundancy. Mild canal stenosis. Moderate to severe bilateral neural foraminal narrowing. L5-S1: Moderate LEFT central broad-based disc protrusion. Mild RIGHT, moderate LEFT facet arthropathy. No canal stenosis though partial effacement LEFT lateral recess could affect the traversing LEFT S1 nerve. Mild to moderate RIGHT, moderate LEFT neural foraminal narrowing. IMPRESSION: Motion degraded, limited examination. No acute fracture or malalignment. Mild canal stenosis L4-5, partial lateral recess effacement L5-S1 could affect the traversing LEFT S1 nerve. Small L4-5 disc extrusion. Neural foraminal narrowing L3-4 through L5-S1: Moderate to severe bilaterally at L4-5. Electronically Signed   By: Elon Alas M.D.   On: 08/20/2016 19:45   US Abdomen Complete  Result Date: 07/29/2016 CLINICAL DATA:  Elevated liver  function studies, bloating, increased gas. 15 pound weight loss over the past 6 months. Previous cholecystectomy. EXAM: ABDOMEN ULTRASOUND COMPLETE COMPARISON:  Abdominal pelvic CT scan of November 11, 2010. FINDINGS: Gallbladder: The gallbladder is surgically absent. Common bile duct: Diameter: 8.8 mm Liver: The hepatic echotexture is mildly increased and slightly heterogeneous. There is no focal mass nor ductal dilation. There is a left lobe cyst measuring 11 mm in diameter which is been previously demonstrated. IVC: Bowel gas limits visualization of the IVC. Pancreas: Bowel gas limits visualization of the pancreas. Spleen: There is splenomegaly. Spleen measures 16.4 x 14.7 x 4.3 cm with a calculated volume of 539 cc. There is a stable cyst near the dome of the spleen measuring 1 cm in diameter. Right Kidney: Length: 11.3 cm. The cortical echotexture remains lower than that of the liver. There is no hydronephrosis. There is a lower pole cyst measuring 3.3 x 3.6 x 4 cm. Left Kidney: Length: 10.9 cm. Echogenicity within normal limits. No mass or hydronephrosis visualized. Abdominal aorta: Visualization of the abdominal aorta is limited due to bowel gas. Other findings: There is no ascites. IMPRESSION: 1. Common bile duct dilation to 8.8 mm is not abnormal post cholecystectomy. Fatty infiltrative change of the liver. Mild splenomegaly with calculated volume of 539 cc. Simple appearing 1 cm diameter splenic cyst. 2. No  hydronephrosis or evidence of stones. Simple appearing lower pole cyst in the right kidney. 3. No ascites. Electronically Signed   By: David  Martinique M.D.   On: 07/29/2016 10:00   Dg Esophagus  Result Date: 08/19/2016 CLINICAL DATA:  82 year old female with pneumonia, acid reflux. Initial encounter. EXAM: ESOPHOGRAM / BARIUM SWALLOW / BARIUM TABLET STUDY TECHNIQUE: Combined double contrast and single contrast examination performed using thick barium liquid and thin barium liquid. The patient was  observed with fluoroscopy swallowing a 13 mm barium sulphate tablet. FLUOROSCOPY TIME:  Fluoroscopy Time:  1 minutes 12 seconds Radiation Exposure Index (if provided by the fluoroscopic device): Number of Acquired Spot Images: 0 COMPARISON:  Chest CT without contrast 08/18/2016. FINDINGS: The patient was unable to stand, so a single contrast study was undertaken with the patient inclined at 30 degrees on the fluoroscopy table. No obstruction to the forward flow of contrast throughout the esophagus and into the stomach. Frequent tertiary contractions. No aspiration was observed. A 12.5 mm barium tablet was administered and passed freely to the midesophagus level. After this the tablet passed slowly into the distal esophagus. It did not pass through the distal most esophagus or gastroesophageal junction despite several additional swallows of water, thick barium, and thin barium. However, no discrete stricture or narrowing in the region is evident (series 12). IMPRESSION: 1. Presbyesophagus. Decreased esophageal motility and frequent tertiary contractions. 2. A 12.5 mm barium tablet did not pass freely through the distal esophagus, however, this seems more related to presbyesophagus and not to any esophageal narrowing/stricture. This could be confirmed with a repeat esophagram once the patient recovers from the acute illness and is able to stand and tolerate a double contrast study. 3. No aspiration was observed. Electronically Signed   By: Genevie Ann M.D.   On: 08/19/2016 13:24    Assessment/Plan  Physical deconditioning With generalized weakness. She recently had pneumonia and urinary tract infection requiring hospitalization. She is here for rehabilitation. Will have her work with physical therapy and occupational therapy to help regain her strength and balance.  Left lobar pneumonia Afebrile. Continues to have some cough. Continue and complete her course of cefpodoxime in 100 mg twice a day on 08/28/2016 and  azithromycin 500 mg daily on 08/26/2069. Monitor WBC and temperature curve. Continue with cough expectorant and encouraged to use incentive spirometer  Escherichia coli UTI Encourage hydration. Perineal hygiene to be maintained. Continue and complete antibiotic as above  Oral thrush Continue and complete nystatin on 08/28/2016. To provide oral hygiene.  Leg edema Keep legs elevated at rest. Add ted hose  Fever blister Continue Abreva cream for now  Hypertension Continue amlodipine and monitor blood pressure reading.  Gastroesophageal reflux disease Continue her famotidine  Allergic rhinitis  currently on Flonase. Monitor.  Anemia of chronic disease Monitor CBC.  Goals of care: short term rehabilitation   Labs/tests ordered: CBC, BMP  Family/ staff Communication: reviewed care plan with patient and nursing supervisor    Blanchie Serve, MD Internal Medicine [place], [place] [address] Cell Phone (Monday-Friday 8 am - 5 pm): [phone] On Call: [phone] and follow prompts after 5 pm and on weekends Office Phone: [phone] Office Fax: [phone]

## 2016-09-08 ENCOUNTER — Encounter: Payer: Self-pay | Admitting: Adult Health

## 2016-09-08 ENCOUNTER — Non-Acute Institutional Stay (SKILLED_NURSING_FACILITY): Payer: Medicare Other | Admitting: Adult Health

## 2016-09-08 DIAGNOSIS — R531 Weakness: Secondary | ICD-10-CM | POA: Diagnosis not present

## 2016-09-08 DIAGNOSIS — J309 Allergic rhinitis, unspecified: Secondary | ICD-10-CM | POA: Diagnosis not present

## 2016-09-08 DIAGNOSIS — R627 Adult failure to thrive: Secondary | ICD-10-CM

## 2016-09-08 DIAGNOSIS — K5901 Slow transit constipation: Secondary | ICD-10-CM | POA: Diagnosis not present

## 2016-09-08 DIAGNOSIS — K2289 Other specified disease of esophagus: Secondary | ICD-10-CM

## 2016-09-08 DIAGNOSIS — K228 Other specified diseases of esophagus: Secondary | ICD-10-CM | POA: Diagnosis not present

## 2016-09-08 DIAGNOSIS — I10 Essential (primary) hypertension: Secondary | ICD-10-CM | POA: Diagnosis not present

## 2016-09-08 DIAGNOSIS — D638 Anemia in other chronic diseases classified elsewhere: Secondary | ICD-10-CM | POA: Diagnosis not present

## 2016-09-08 NOTE — Progress Notes (Signed)
DATE:  09/08/2016   MRN:  [id number]  BIRTHDAY: 10-Sep-1931  Facility:  Nursing Home Location:  [place] Room Number: 408-P  LEVEL OF CARE:  SNF [phone])  Contact Information    Name [place] Daughter [phone]  [phone]   Carmelina Paddock   [phone]       Code Status History    Date Active Date Inactive Code Status Order ID Comments User Context   08/19/2016  1:02 PM 08/21/2016  5:38 PM DNR [id number]  Florencia Reasons, MD Inpatient   08/18/2016  8:36 PM 08/19/2016  1:01 PM Full Code [id number]  Louellen Molder, MD Inpatient    Questions for Most Recent Historical Code Status (Order [id number])    Question Answer Comment   In the event of cardiac or respiratory ARREST Do not call a "code blue"    In the event of cardiac or respiratory ARREST Do not perform Intubation, CPR, defibrillation or ACLS    In the event of cardiac or respiratory ARREST Use medication by any route, position, wound care, and other measures to relive pain and suffering. May use oxygen, suction and manual treatment of airway obstruction as needed for comfort.         Advance Directive Documentation   Flowsheet Row Most Recent Value  Type of Advance Directive  Healthcare Power of Attorney, Living will, Out of facility DNR (pink MOST or yellow form)  Pre-existing out of facility DNR order (yellow form or pink MOST form)  No data  "MOST" Form in Place?  No data       Chief Complaint  Patient presents with  . Discharge Note    HISTORY OF PRESENT ILLNESS:  This is an 82 year old female who is for discharge home with Home health PT, OT, SW, CNA and Nursing. DME:  Semi-electric hospital bed and bedside commode    She has PMH of hypertension and prediabetes. She has been admitted to [facility] on 08/21/16 from [facility] hospitalization 08/18/16 to 08/21/16. She was having multiple falls in the [facility] home and was sent to  the hospital from her PCPs office. She was found to have LUL pneumonia and UTI. She was started on Rocephin and Zithromax, switched to Vantin 7 days, then Zithromax for a total of 5 days. Sputum and blood cultures were unrevealing and strep antigen negative.  Patient was admitted to this facility for short-term rehabilitation after the patient's recent hospitalization.  Patient has completed SNF rehabilitation and therapy has cleared the patient for discharge.   PAST MEDICAL HISTORY:  Past Medical History:  Diagnosis Date  . Dehydration, mild 08/2016  . FTT (failure to thrive) in adult 08/2016  . Hypertension   . Hyponatremia 08/2016  . Lobar pneumonia, unspecified organism ([place]) 08/2016  . Pre-diabetes   . SIRS (systemic inflammatory response syndrome) ([place]) 08/2016  . Skin cancer   . UTI (urinary tract infection) 08/2016     CURRENT MEDICATIONS: Reviewed  Patient's Medications  New Prescriptions   No medications on file  Previous Medications   ACETAMINOPHEN (TYLENOL) 325 MG TABLET    Take 650 mg by mouth every 4 (four) hours as needed for mild pain.   AMLODIPINE (NORVASC) 2.5 MG TABLET    Take 2.5 mg by mouth daily.    ELASTIC BANDAGES & SUPPORTS (T.E.D. BELOW KNEE/M-REGULAR) MISC    On in AM and off in PM. Medical supply store to measure and  give appropriate size. Moderate compression.   FAMOTIDINE (PEPCID) 20 MG TABLET    Take 1 tablet (20 mg total) by mouth daily.   FLUTICASONE (FLONASE) 50 MCG/ACT NASAL SPRAY    Place into both nostrils daily.   GUAIFENESIN (MUCINEX) 600 MG 12 HR TABLET    Take 1 tablet (600 mg total) by mouth 2 (two) times daily as needed.   LOPERAMIDE (IMODIUM) 2 MG CAPSULE    Take 2 mg by mouth 4 (four) times daily as needed for diarrhea or loose stools.   MENTHOL-ZINC OXIDE (GOLD BOND) POWDER    Apply 1 application topically daily.   MULTIPLE VITAMINS-MINERALS (DAILY MULTI 50+ PO)    Take 1 tablet by mouth daily.   NYSTATIN CREAM (MYCOSTATIN)    Apply 1  application topically daily as needed for irritation or rash.   SENNA-DOCUSATE (SENOKOT-S) 8.6-50 MG TABLET    Take 1 tablet by mouth at bedtime as needed for mild constipation.   SIMETHICONE (MYLICON) 0000000 MG CHEWABLE TABLET    Chew 125 mg by mouth every 6 (six) hours as needed for flatulence.  Modified Medications   No medications on file  Discontinued Medications   DOCOSANOL (ABREVA) 10 % CREA    Apply 1 application topically daily. Apply to upper lip rash. Stop date 09/03/16   NYSTATIN (MYCOSTATIN) 100000 UNIT/ML SUSPENSION    Take 5 mLs (500,000 Units total) by mouth 4 (four) times daily.     No Known Allergies   REVIEW OF SYSTEMS:  GENERAL: no change in appetite, no fatigue, no weight changes, no fever, chills or weakness EYES: Denies change in vision, dry eyes, eye pain, itching or discharge EARS: Denies change in hearing, ringing in ears, or earache NOSE: Denies nasal congestion or epistaxis MOUTH and THROAT: Denies oral discomfort, gingival pain or bleeding, pain from teeth or hoarseness   RESPIRATORY: no cough, SOB, DOE, wheezing, hemoptysis CARDIAC: no chest pain, edema or palpitations GI: no abdominal pain, diarrhea, constipation, heart burn, nausea or vomiting GU: Denies dysuria, frequency, hematuria, incontinence, or discharge PSYCHIATRIC: Denies feeling of depression or anxiety. No report of hallucinations, insomnia, paranoia, or agitation    PHYSICAL EXAMINATION  GENERAL APPEARANCE: Well nourished. In no acute distress. Normal body habitus SKIN:  Skin is dry.  HEAD: Normal in size Skin is warm anand contour. No evidence of trauma EYES: Lids open and close normally. No blepharitis, entropion or ectropion. PERRL. Conjunctivae are clear and sclerae are white. Lenses are without opacity EARS: Pinnae are normal. Patient hears normal voice tunes of the examiner MOUTH and THROAT: Upper lip has lesions. Oral mucosa is moist and without lesions. Tongue has erythematous  lesions NECK: supple, trachea midline, no neck masses, no thyroid tenderness, no thyromegaly LYMPHATICS: no LAN in the neck, no supraclavicular LAN RESPIRATORY: breathing is even & unlabored, BS CTAB CARDIAC: RRR, no murmur,no extra heart sounds, BLE trace edema GI: abdomen soft, normal BS, no masses, no tenderness, no hepatomegaly, no splenomegaly EXTREMITIES:  Able to move 4 extremities PSYCHIATRIC: Alert and oriented X 3. Affect and behavior are appropriate  LABS/RADIOLOGY: Labs reviewed: Basic Metabolic Panel:  Recent Labs  08/19/16 0425 08/20/16 0412 08/21/16 0450 08/24/16  NA 135 131* 135 139  K 3.4* 3.9 4.1 4.1  CL 102 100* 102  --   CO2 27 24 26   --   GLUCOSE 147* 117* 117*  --   BUN 12 11 8 7   CREATININE 0.55 0.42* 0.52 0.5  CALCIUM 7.8* 7.6* 7.7*  --  MG  --  1.8  --   --    Liver Function Tests:  Recent Labs  08/18/16 1417  AST 39  ALT 21  ALKPHOS 127*  BILITOT 0.9  PROT 6.2*  ALBUMIN 2.4*   CBC:  Recent Labs  08/18/16 1417 08/18/16 2052 08/19/16 0425 08/20/16 0412 08/24/16  WBC 8.5 7.9 6.3 5.7 6.2  NEUTROABS 7.2  --   --  4.1 4  HGB 11.0* 10.0* 9.8* 10.1* 10.8*  HCT 35.1* 31.4* 31.6* 31.7* 35*  MCV 83.0 82.2 82.9 81.5  --   PLT 229 213 191 238 348      Dg Chest 2 View  Result Date: 08/18/2016 CLINICAL DATA:  History of a fall 2 days ago.  Chest pain. EXAM: CHEST  2 VIEW COMPARISON:  11/11/2010 FINDINGS: The cardiac silhouette, mediastinal and hilar contours are within normal limits. There is tortuosity and calcification of the thoracic aorta. Airspace consolidation noted in the left upper lobe and also in the left lower lobe. These could represent infiltrates, aspiration or pulmonary contusions. Underline emphysematous changes and pulmonary scarring. No definite pleural effusions. No pneumothorax. The bony thorax is intact. No definite rib fractures are identified. IMPRESSION: Left upper lobe and left lower airspace opacities could reflect  pneumonia/aspiration or pulmonary contusion. Followup PA and lateral chest X-ray is recommended in 3-4 weeks following trial of antibiotic therapy to ensure resolution and exclude underlying malignancy. Electronically Signed   By: Marijo Sanes M.D.   On: 08/18/2016 15:31   Ct Head Wo Contrast  Result Date: 08/18/2016 CLINICAL DATA:  Witnessed fall from standing position this morning. Increased falls and decreased mobility recently. Initial encounter. EXAM: CT HEAD WITHOUT CONTRAST TECHNIQUE: Contiguous axial images were obtained from the base of the skull through the vertex without intravenous contrast. COMPARISON:  None. FINDINGS: Brain: There is no evidence of acute cortical infarct, intracranial hemorrhage, mass, midline shift, or extra-axial fluid collection. Mild cerebral atrophy is within normal limits for age. Periventricular white matter hypodensities are nonspecific but compatible with mild chronic small vessel ischemic disease. There is a lacunar infarct along the lateral margin of the left thalamus, chronic in appearance. Vascular: Calcified atherosclerosis at the skullbase. No hyperdense vessel. Skull: Hyperostosis frontalis interna. No fracture or destructive osseous lesion. Sinuses/Orbits: Minimal anterior left ethmoid air cell mucosal thickening. Clear mastoid air cells. Visualized orbits are unremarkable. Other: None. IMPRESSION: 1. No evidence of acute intracranial abnormality. 2. Mild chronic small vessel ischemic disease. Electronically Signed   By: Logan Bores M.D.   On: 08/18/2016 16:02   Ct Chest Wo Contrast  Result Date: 08/18/2016 CLINICAL DATA:  Possible pneumonia.  Fall 2 days ago. EXAM: CT CHEST WITHOUT CONTRAST TECHNIQUE: Multidetector CT imaging of the chest was performed following the standard protocol without IV contrast. COMPARISON:  Chest x-ray from earlier today FINDINGS: Cardiovascular: No cardiomegaly. Small anterior pericardial effusion. Extensive mitral annular  calcification. Coronary atherosclerosis. Atherosclerotic calcification. Mediastinum/Nodes: Debris in the esophagus without detectable wall thickening. Negative for adenopathy. Goiter with dystrophic calcifications in the right lobe. Lungs/Pleura: Dense consolidation in the posterior segment left upper lobe correlating with chest x-ray findings. No evidence of airway debris. No pneumatocele, hemothorax, or pneumothorax. Trace left pleural effusion. Upper Abdomen: Hepatic steatosis chronic. Benign low-density lesion in the upper spleen, stable from 2012. Musculoskeletal: Spondylosis.  No acute or aggressive finding. IMPRESSION: 1. Segmental left upper lobe pneumonia. Followup PA and lateral chest X-ray is recommended 3 to 4 weeks following trial of antibiotic therapy to  ensure resolution. 2. No posttraumatic finding. 3. Reflux or stasis in the esophagus. 4. Small anterior pericardial effusion. 5. Hepatic steatosis. Electronically Signed   By: Monte Fantasia M.D.   On: 08/18/2016 16:34   Mr Lumbar Spine Wo Contrast  Result Date: 08/20/2016 CLINICAL DATA:  Frequent falls, bilateral lower extremity weakness and incontinence. EXAM: MRI LUMBAR SPINE WITHOUT CONTRAST TECHNIQUE: Multiplanar, multisequence MR imaging of the lumbar spine was performed. No intravenous contrast was administered. Patient is unable to tolerate further imaging, axial T1 sequence not obtained. COMPARISON:  CT abdomen and pelvis November 11, 2010 FINDINGS: Moderately motion degraded examination. Patient discontinued the examination prior to axial T1 sequence. SEGMENTATION: For the purposes of this report, the last well-formed intervertebral disc will be described as L5-S1. ALIGNMENT: Maintenance of the lumbar lordosis. No malalignment. VERTEBRAE:Vertebral bodies are intact. Severe L4-5 and moderate to severe L5-S1 disc height loss. Remaining disc morphology preserved. Decreased T2 signal within all disc compatible with mild desiccation. Moderate  L4-5 and L5-S1 subacute on chronic discogenic endplate changes. Mild L1-2, L2-3 ventral endplate spurring. Mild subacute discogenic endplate changes [id number]. No suspicious bone marrow signal. CONUS MEDULLARIS: Conus medullaris terminates at L1-2 and demonstrates normal morphology and signal characteristics. Cauda equina is normal. PARASPINAL AND SOFT TISSUES: Included prevertebral and paraspinal soft tissues are nonacute. 3.7 cm benign-appearing RIGHT lower pole renal cyst. Moderate symmetric paraspinal muscle atrophy. Abnormal signal LEFT S3 nerve root may represent Tarlov cysts, incompletely characterized. DISC LEVELS (limited assessment: Lack of axial T1, moderately motion degraded axial T2): T12-L1: No disc bulge, canal stenosis nor neural foraminal narrowing. L1-2: Small LEFT subarticular to extra foraminal disc protrusion. No canal stenosis or neural foraminal narrowing. L2-3:  No disc bulge, canal stenosis nor neural foraminal narrowing. L3-4: Small broad-based disc bulge. Moderate LEFT extra foraminal disc protrusion. Mild facet arthropathy and ligamentum flavum redundancy. No canal stenosis. Mild RIGHT, mild to moderate LEFT neural foraminal narrowing. L4-5: Moderate broad-based disc bulge, LEFT central disc extrusion with 8 mm of contiguous inferior migration, difficult to characterize due to patient motion. Moderate facet arthropathy and ligamentum flavum redundancy. Mild canal stenosis. Moderate to severe bilateral neural foraminal narrowing. L5-S1: Moderate LEFT central broad-based disc protrusion. Mild RIGHT, moderate LEFT facet arthropathy. No canal stenosis though partial effacement LEFT lateral recess could affect the traversing LEFT S1 nerve. Mild to moderate RIGHT, moderate LEFT neural foraminal narrowing. IMPRESSION: Motion degraded, limited examination. No acute fracture or malalignment. Mild canal stenosis L4-5, partial lateral recess effacement L5-S1 could affect the traversing LEFT S1 nerve.  Small L4-5 disc extrusion. Neural foraminal narrowing L3-4 through L5-S1: Moderate to severe bilaterally at L4-5. Electronically Signed   By: Elon Alas M.D.   On: 08/20/2016 19:45   Dg Esophagus  Result Date: 08/19/2016 CLINICAL DATA:  82 year old female with pneumonia, acid reflux. Initial encounter. EXAM: ESOPHOGRAM / BARIUM SWALLOW / BARIUM TABLET STUDY TECHNIQUE: Combined double contrast and single contrast examination performed using thick barium liquid and thin barium liquid. The patient was observed with fluoroscopy swallowing a 13 mm barium sulphate tablet. FLUOROSCOPY TIME:  Fluoroscopy Time:  1 minutes 12 seconds Radiation Exposure Index (if provided by the fluoroscopic device): Number of Acquired Spot Images: 0 COMPARISON:  Chest CT without contrast 08/18/2016. FINDINGS: The patient was unable to stand, so a single contrast study was undertaken with the patient inclined at 30 degrees on the fluoroscopy table. No obstruction to the forward flow of contrast throughout the esophagus and into the stomach. Frequent tertiary contractions. No aspiration  was observed. A 12.5 mm barium tablet was administered and passed freely to the midesophagus level. After this the tablet passed slowly into the distal esophagus. It did not pass through the distal most esophagus or gastroesophageal junction despite several additional swallows of water, thick barium, and thin barium. However, no discrete stricture or narrowing in the region is evident (series 12). IMPRESSION: 1. Presbyesophagus. Decreased esophageal motility and frequent tertiary contractions. 2. A 12.5 mm barium tablet did not pass freely through the distal esophagus, however, this seems more related to presbyesophagus and not to any esophageal narrowing/stricture. This could be confirmed with a repeat esophagram once the patient recovers from the acute illness and is able to stand and tolerate a double contrast study. 3. No aspiration was observed.  Electronically Signed   By: Genevie Ann M.D.   On: 08/19/2016 13:24    ASSESSMENT/PLAN:  Generalized weakness - for Home health CNA, Nursing, SW, PT and OT, for therapeutic strengthening exercises; fall precaution  Pneumonia, LUL - resolved  UTI - resolved  Presbyesophagus - CT showed reflux versus stasis in esophagus; esophagram showed presbyesophagram; decreased esophageal motility and frequent tertiary contractions; continue Pepcid 20 mg 1 tab by mouth daily  Hypertension - continue amlodipine 2.5 mg 1 tab by mouth daily; check BMP  Anemia of chronic disease - check CBC Lab Results  Component Value Date   HGB 10.8 (A) 08/24/2016   Failure to thrive - with frequent falls and weight loss; CT head with no acute findings; MRI lumbar spine on 12/1 showed neural foraminal narrowing L3-4 through L5-S1, moderate to severe bilaterally at L4-5; her weight has been stable, 148.8 lbs, BMI 28.91   Oral thrush - resolved  Constipation - continue senna S 8.6/50 mg 1 tab by mouth daily at bedtime when necessary  Allergic rhinitis - continue Flonase 50 g 1 spray into both nostrils daily   Fever blister - resolved     I have filled out patient's discharge paperwork and written prescriptions.  Patient will receive home health PT, OT, SW, Nursing and CNA.  DME provided:  Semi-electric hospital bed and bedside commode  Total discharge time: Greater than 30 minutes Greater than 50% was spent in counseling and coordination of care with the patient.   Discharge time involved coordination of the discharge process with social worker, nursing staff and therapy department. Medical justification for home health services/DME verified.    [employer] [phone]

## 2016-09-17 ENCOUNTER — Ambulatory Visit
Admission: RE | Admit: 2016-09-17 | Discharge: 2016-09-17 | Disposition: A | Discharge status: Home / Self Care | Payer: Medicare Other | Source: Ambulatory Visit | Attending: Gastroenterology | Admitting: Gastroenterology

## 2016-09-17 ENCOUNTER — Ambulatory Visit
Admission: RE | Admit: 2016-09-17 | Discharge: 2016-09-17 | Disposition: A | Discharge status: Home / Self Care | Payer: Medicare Other | Source: Ambulatory Visit | Attending: Family Medicine | Admitting: Family Medicine

## 2016-09-17 ENCOUNTER — Other Ambulatory Visit: Payer: Medicare Other

## 2016-09-17 ENCOUNTER — Other Ambulatory Visit: Payer: Self-pay | Admitting: *Deleted

## 2016-09-17 ENCOUNTER — Other Ambulatory Visit: Payer: Self-pay | Admitting: Gastroenterology

## 2016-09-17 ENCOUNTER — Other Ambulatory Visit: Payer: Self-pay | Admitting: Family Medicine

## 2016-09-17 DIAGNOSIS — J189 Pneumonia, unspecified organism: Secondary | ICD-10-CM

## 2016-09-17 DIAGNOSIS — R197 Diarrhea, unspecified: Secondary | ICD-10-CM

## 2016-09-21 ENCOUNTER — Encounter (HOSPITAL_COMMUNITY): Payer: Self-pay | Admitting: Emergency Medicine

## 2016-09-21 ENCOUNTER — Emergency Department (HOSPITAL_COMMUNITY): Payer: Medicare Other

## 2016-09-21 ENCOUNTER — Inpatient Hospital Stay (HOSPITAL_COMMUNITY)
Admission: EM | Admit: 2016-09-21 | Discharge: 2016-10-01 | DRG: 329 | Disposition: A | Discharge status: Skilled Nursing Facility | Payer: Medicare Other | Attending: Internal Medicine | Admitting: Internal Medicine

## 2016-09-21 DIAGNOSIS — Z6828 Body mass index (BMI) 28.0-28.9, adult: Secondary | ICD-10-CM

## 2016-09-21 DIAGNOSIS — R079 Chest pain, unspecified: Secondary | ICD-10-CM

## 2016-09-21 DIAGNOSIS — N390 Urinary tract infection, site not specified: Secondary | ICD-10-CM | POA: Diagnosis present

## 2016-09-21 DIAGNOSIS — R600 Localized edema: Secondary | ICD-10-CM | POA: Diagnosis present

## 2016-09-21 DIAGNOSIS — K567 Ileus, unspecified: Secondary | ICD-10-CM

## 2016-09-21 DIAGNOSIS — B370 Candidal stomatitis: Secondary | ICD-10-CM | POA: Diagnosis present

## 2016-09-21 DIAGNOSIS — R197 Diarrhea, unspecified: Secondary | ICD-10-CM | POA: Diagnosis present

## 2016-09-21 DIAGNOSIS — Z79899 Other long term (current) drug therapy: Secondary | ICD-10-CM

## 2016-09-21 DIAGNOSIS — C187 Malignant neoplasm of sigmoid colon: Principal | ICD-10-CM | POA: Diagnosis present

## 2016-09-21 DIAGNOSIS — Z823 Family history of stroke: Secondary | ICD-10-CM

## 2016-09-21 DIAGNOSIS — K644 Residual hemorrhoidal skin tags: Secondary | ICD-10-CM | POA: Diagnosis present

## 2016-09-21 DIAGNOSIS — D500 Iron deficiency anemia secondary to blood loss (chronic): Secondary | ICD-10-CM | POA: Diagnosis present

## 2016-09-21 DIAGNOSIS — K6389 Other specified diseases of intestine: Secondary | ICD-10-CM | POA: Diagnosis present

## 2016-09-21 DIAGNOSIS — K648 Other hemorrhoids: Secondary | ICD-10-CM | POA: Diagnosis present

## 2016-09-21 DIAGNOSIS — D374 Neoplasm of uncertain behavior of colon: Secondary | ICD-10-CM | POA: Diagnosis not present

## 2016-09-21 DIAGNOSIS — E860 Dehydration: Secondary | ICD-10-CM | POA: Diagnosis not present

## 2016-09-21 DIAGNOSIS — R627 Adult failure to thrive: Secondary | ICD-10-CM | POA: Diagnosis present

## 2016-09-21 DIAGNOSIS — E876 Hypokalemia: Secondary | ICD-10-CM | POA: Diagnosis not present

## 2016-09-21 DIAGNOSIS — J189 Pneumonia, unspecified organism: Secondary | ICD-10-CM

## 2016-09-21 DIAGNOSIS — M6281 Muscle weakness (generalized): Secondary | ICD-10-CM

## 2016-09-21 DIAGNOSIS — K9189 Other postprocedural complications and disorders of digestive system: Secondary | ICD-10-CM

## 2016-09-21 DIAGNOSIS — D649 Anemia, unspecified: Secondary | ICD-10-CM | POA: Diagnosis present

## 2016-09-21 DIAGNOSIS — K5730 Diverticulosis of large intestine without perforation or abscess without bleeding: Secondary | ICD-10-CM | POA: Diagnosis present

## 2016-09-21 DIAGNOSIS — I10 Essential (primary) hypertension: Secondary | ICD-10-CM | POA: Diagnosis present

## 2016-09-21 DIAGNOSIS — K639 Disease of intestine, unspecified: Secondary | ICD-10-CM | POA: Diagnosis not present

## 2016-09-21 DIAGNOSIS — K219 Gastro-esophageal reflux disease without esophagitis: Secondary | ICD-10-CM | POA: Diagnosis present

## 2016-09-21 DIAGNOSIS — Z66 Do not resuscitate: Secondary | ICD-10-CM | POA: Diagnosis present

## 2016-09-21 DIAGNOSIS — R509 Fever, unspecified: Secondary | ICD-10-CM

## 2016-09-21 DIAGNOSIS — Z85820 Personal history of malignant melanoma of skin: Secondary | ICD-10-CM

## 2016-09-21 DIAGNOSIS — D638 Anemia in other chronic diseases classified elsewhere: Secondary | ICD-10-CM | POA: Diagnosis present

## 2016-09-21 DIAGNOSIS — E43 Unspecified severe protein-calorie malnutrition: Secondary | ICD-10-CM | POA: Diagnosis present

## 2016-09-21 LAB — URINALYSIS, ROUTINE W REFLEX MICROSCOPIC
BILIRUBIN URINE: NEGATIVE
Glucose, UA: NEGATIVE mg/dL
Ketones, ur: NEGATIVE mg/dL
Nitrite: NEGATIVE
PROTEIN: 100 mg/dL — AB
Squamous Epithelial / LPF: NONE SEEN
pH: 7 (ref 5.0–8.0)

## 2016-09-21 LAB — FOLATE: FOLATE: 20.7 ng/mL (ref >5.9)

## 2016-09-21 LAB — MAGNESIUM: Magnesium: 2.1 mg/dL (ref 1.7–2.4)

## 2016-09-21 LAB — LIPASE, BLOOD: Lipase: 12 U/L (ref 11–51)

## 2016-09-21 LAB — COMPREHENSIVE METABOLIC PANEL
ALK PHOS: 159 U/L — AB (ref 38–126)
ALT: 13 U/L — AB (ref 14–54)
AST: 28 U/L (ref 15–41)
Albumin: 1.9 g/dL — ABNORMAL LOW (ref 3.5–5.0)
Anion gap: 9 (ref 5–15)
BUN: 13 mg/dL (ref 6–20)
CALCIUM: 7.9 mg/dL — AB (ref 8.9–10.3)
CO2: 28 mmol/L (ref 22–32)
Chloride: 99 mmol/L — ABNORMAL LOW (ref 101–111)
Creatinine, Ser: 0.35 mg/dL — ABNORMAL LOW (ref 0.44–1.00)
GFR calc Af Amer: >60 mL/min (ref >60)
GFR calc non Af Amer: >60 mL/min (ref >60)
GLUCOSE: 124 mg/dL — AB (ref 65–99)
Potassium: 4 mmol/L (ref 3.5–5.1)
SODIUM: 136 mmol/L (ref 135–145)
Total Bilirubin: 0.4 mg/dL (ref 0.3–1.2)
Total Protein: 6 g/dL — ABNORMAL LOW (ref 6.5–8.1)

## 2016-09-21 LAB — IRON AND TIBC
IRON: 6 ug/dL — AB (ref 28–170)
Saturation Ratios: 5 % — ABNORMAL LOW (ref 10.4–31.8)
TIBC: 130 ug/dL — ABNORMAL LOW (ref 250–450)
UIBC: 124 ug/dL

## 2016-09-21 LAB — FERRITIN: FERRITIN: 128 ng/mL (ref 11–307)

## 2016-09-21 LAB — CBC
HCT: 33.9 % — ABNORMAL LOW (ref 36.0–46.0)
Hemoglobin: 10.1 g/dL — ABNORMAL LOW (ref 12.0–15.0)
MCH: 23.5 pg — AB (ref 26.0–34.0)
MCHC: 29.8 g/dL — AB (ref 30.0–36.0)
MCV: 79 fL (ref 78.0–100.0)
Platelets: 278 10*3/uL (ref 150–400)
RBC: 4.29 MIL/uL (ref 3.87–5.11)
RDW: 15.7 % — ABNORMAL HIGH (ref 11.5–15.5)
WBC: 7 10*3/uL (ref 4.0–10.5)

## 2016-09-21 LAB — VITAMIN B12: Vitamin B-12: 786 pg/mL (ref 180–914)

## 2016-09-21 MED ORDER — SORBITOL 70 % SOLN
30.0000 mL | Freq: Every day | Status: DC | PRN
  Filled 2016-09-21: qty 30

## 2016-09-21 MED ORDER — NYSTATIN 100000 UNIT/GM EX CREA
1.0000 "application " | TOPICAL_CREAM | Freq: Two times a day (BID) | CUTANEOUS | Status: DC
  Administered 2016-09-21 – 2016-10-01 (×20): 1 via TOPICAL
  Filled 2016-09-21: qty 15

## 2016-09-21 MED ORDER — ONDANSETRON HCL 4 MG/2ML IJ SOLN
4.0000 mg | Freq: Four times a day (QID) | INTRAMUSCULAR | Status: DC | PRN
  Administered 2016-09-21 – 2016-10-01 (×4): 4 mg via INTRAVENOUS
  Filled 2016-09-21 (×4): qty 2

## 2016-09-21 MED ORDER — FENTANYL CITRATE (PF) 100 MCG/2ML IJ SOLN
25.0000 ug | Freq: Once | INTRAMUSCULAR | Status: AC
  Administered 2016-09-21: 25 ug via INTRAVENOUS
  Filled 2016-09-21: qty 2

## 2016-09-21 MED ORDER — FLUCONAZOLE IN SODIUM CHLORIDE 100-0.9 MG/50ML-% IV SOLN
100.0000 mg | INTRAVENOUS | Status: DC
  Administered 2016-09-22 – 2016-09-28 (×6): 100 mg via INTRAVENOUS
  Filled 2016-09-21 (×10): qty 50

## 2016-09-21 MED ORDER — ENOXAPARIN SODIUM 40 MG/0.4ML ~~LOC~~ SOLN
40.0000 mg | SUBCUTANEOUS | Status: DC
  Administered 2016-09-21 – 2016-09-30 (×9): 40 mg via SUBCUTANEOUS
  Filled 2016-09-21 (×9): qty 0.4

## 2016-09-21 MED ORDER — SODIUM CHLORIDE 0.9 % IV BOLUS (SEPSIS)
500.0000 mL | Freq: Once | INTRAVENOUS | Status: AC
  Administered 2016-09-21: 500 mL via INTRAVENOUS

## 2016-09-21 MED ORDER — MORPHINE SULFATE (PF) 2 MG/ML IV SOLN
1.0000 mg | INTRAVENOUS | Status: DC | PRN
  Administered 2016-09-22 – 2016-09-25 (×4): 2 mg via INTRAVENOUS
  Administered 2016-09-27 (×2): 1 mg via INTRAVENOUS
  Filled 2016-09-21 (×7): qty 1

## 2016-09-21 MED ORDER — ACETAMINOPHEN 325 MG PO TABS
650.0000 mg | ORAL_TABLET | Freq: Four times a day (QID) | ORAL | Status: DC | PRN
  Administered 2016-09-22 – 2016-10-01 (×18): 650 mg via ORAL
  Filled 2016-09-21 (×20): qty 2

## 2016-09-21 MED ORDER — SIMETHICONE 80 MG PO CHEW
80.0000 mg | CHEWABLE_TABLET | Freq: Four times a day (QID) | ORAL | Status: DC | PRN
  Administered 2016-09-21 – 2016-10-01 (×14): 80 mg via ORAL
  Filled 2016-09-21 (×17): qty 1

## 2016-09-21 MED ORDER — FLUTICASONE PROPIONATE 50 MCG/ACT NA SUSP
1.0000 | Freq: Every day | NASAL | Status: DC
  Administered 2016-09-22 – 2016-10-01 (×9): 1 via NASAL
  Filled 2016-09-21: qty 16

## 2016-09-21 MED ORDER — SODIUM CHLORIDE 0.9 % IV SOLN
INTRAVENOUS | Status: DC
  Administered 2016-09-21 – 2016-09-22 (×2): via INTRAVENOUS

## 2016-09-21 MED ORDER — IOPAMIDOL (ISOVUE-300) INJECTION 61%
INTRAVENOUS | Status: AC
  Filled 2016-09-21: qty 100

## 2016-09-21 MED ORDER — SENNA 8.6 MG PO TABS
1.0000 | ORAL_TABLET | Freq: Two times a day (BID) | ORAL | Status: DC
  Administered 2016-09-21 – 2016-09-27 (×8): 8.6 mg via ORAL
  Filled 2016-09-21 (×9): qty 1

## 2016-09-21 MED ORDER — TRAMADOL HCL 50 MG PO TABS
50.0000 mg | ORAL_TABLET | Freq: Four times a day (QID) | ORAL | Status: DC | PRN
  Administered 2016-09-25 – 2016-09-26 (×4): 50 mg via ORAL
  Filled 2016-09-21 (×4): qty 1

## 2016-09-21 MED ORDER — ONDANSETRON HCL 4 MG PO TABS
4.0000 mg | ORAL_TABLET | Freq: Four times a day (QID) | ORAL | Status: DC | PRN
  Administered 2016-09-27 – 2016-10-01 (×3): 4 mg via ORAL
  Filled 2016-09-21 (×3): qty 1

## 2016-09-21 MED ORDER — FLUCONAZOLE IN SODIUM CHLORIDE 200-0.9 MG/100ML-% IV SOLN
200.0000 mg | Freq: Once | INTRAVENOUS | Status: AC
Start: 2016-09-21 — End: 2016-09-21
  Administered 2016-09-21: 200 mg via INTRAVENOUS
  Filled 2016-09-21: qty 100

## 2016-09-21 MED ORDER — FLEET ENEMA 7-19 GM/118ML RE ENEM: 1.0000 | ENEMA | Freq: Once | RECTAL | Status: DC | PRN

## 2016-09-21 MED ORDER — CEFTRIAXONE SODIUM 1 G IJ SOLR
1.0000 g | INTRAMUSCULAR | Status: DC
  Administered 2016-09-21 – 2016-09-27 (×6): 1 g via INTRAVENOUS
  Filled 2016-09-21 (×7): qty 10

## 2016-09-21 MED ORDER — PEG 3350-KCL-NA BICARB-NACL 420 G PO SOLR
4000.0000 mL | Freq: Once | ORAL | Status: AC
Start: 2016-09-21 — End: 2016-09-21
  Administered 2016-09-21: 4000 mL via ORAL

## 2016-09-21 MED ORDER — ACETAMINOPHEN 650 MG RE SUPP: 650.0000 mg | Freq: Four times a day (QID) | RECTAL | Status: DC | PRN

## 2016-09-21 MED ORDER — IOPAMIDOL (ISOVUE-300) INJECTION 61%
100.0000 mL | Freq: Once | INTRAVENOUS | Status: AC | PRN
  Administered 2016-09-21: 100 mL via INTRAVENOUS

## 2016-09-21 MED ORDER — KETOROLAC TROMETHAMINE 15 MG/ML IJ SOLN
15.0000 mg | Freq: Four times a day (QID) | INTRAMUSCULAR | Status: AC | PRN
  Administered 2016-09-22 – 2016-09-25 (×3): 15 mg via INTRAVENOUS
  Filled 2016-09-21 (×4): qty 1

## 2016-09-21 MED ORDER — NYSTATIN 100000 UNIT/ML MT SUSP
5.0000 mL | Freq: Four times a day (QID) | OROMUCOSAL | Status: DC
  Administered 2016-09-21 – 2016-09-29 (×24): 500000 [IU] via ORAL
  Filled 2016-09-21 (×26): qty 5

## 2016-09-21 MED ORDER — SODIUM CHLORIDE 0.9 % IV SOLN: INTRAVENOUS | Status: DC

## 2016-09-21 NOTE — Progress Notes (Signed)
Updated [place] of pt admission to [facility] for observation, colonic mass  Pt to be followed for d/c needs

## 2016-09-21 NOTE — H&P (Signed)
History and Physical    Emily Mills CHE:[id number] DOB: Jun 10, 1931 DOA: 09/21/2016  PCP: London Pepper, MD  Patient coming from: [place] living at [place]  Chief Complaint: Generalized weakness/worsening diarrhea  HPI: Emily Mills is a 83 y.o. female with medical history significant of HTN, prior hx of melanoma s/p resection, GERD, presents to the ED with complaints of worsening diarrhea and worsening generalized weakness with associated lightheadedness and a 40 pound weight loss over the past 6 months. Patient was noted to status expressing diarrhea since October with up to 5 loose and watery bowel movements daily which have worsened to about 8 per day. Patient was admitted to the hospital in October for worsening symptoms and at that time diagnosed with a pneumonia and a UTI and subsequently discharged to [facility] for rehabilitation due to a generalized weakness. Patient was subsequently discharged back home from [facility] on December 22. Patient was seen by GI December 28 of ongoing diarrhea x-ray performed was concerning for bowel obstruction. Colonoscopy was recommended at that time however patient refused. Patient was noted during her prior hospitalization to have a negative C. difficile study. Patient also tried some MiraLAX have a with no significant improvement and worsening of her diarrhea and progressive weakness. Patient also complains of left lower quadrant abdominal pain, dysuria, urinary frequency and progressive weakness. Patient endorses some chills, nausea, water brash, recurrence of oral thrush and yeast infection under her breasts per daughter. Patient is also noted to have decreased oral intake with early society and worsening watery loose stools over the past 3 days. Patient denies any melena no hematemesis no hematochezia. No cough. No fever, no chest pain, no shortness of breath. [facility] were called to admit the patient for further evaluation  and management.    ED Course: Patient seen in the ED compressive metabolic profile obtained and a chloride of 99 creatinine of 0.35 calcium was 7.9 alk phosphatase of 159 albumin of 1.9 total protein of 6.0 otherwise is within normal limits. CBC had a hemoglobin of 10.1 otherwise was within normal limits. Urinalysis which was done was turbid, moderate hemoglobin, large leukocytes, nitrite negative, too numerous to count RBCs, too numerous to count WBCs, specific gravity greater than 1.046. CT abdomen and pelvis which was done showed a large heterogeneous enhancing mass likely involving the sigmoid colon. Concern for colonic mass/tumor. This abuts the bladder. Gas noted within the urinary bladder. Small stable cystic areas within the spleen. Prior cholecystectomy. Dependent atelectasis in the lung bases. GI was consulted. [facility] were consulted to admit the patient for further evaluation and management.  Review of Systems: As per HPI otherwise 10 point review of systems negative.   Past Medical History:  Diagnosis Date  . Dehydration, mild 08/2016  . FTT (failure to thrive) in adult 08/2016  . Hypertension   . Hyponatremia 08/2016  . Lobar pneumonia, unspecified organism ([place]) 08/2016  . Pre-diabetes   . SIRS (systemic inflammatory response syndrome) ([place]) 08/2016  . Skin cancer   . UTI (urinary tract infection) 08/2016    Past Surgical History:  Procedure Laterality Date  . CHOLECYSTECTOMY     30 years ago     reports that she has never smoked. She has never used smokeless tobacco. She reports that she does not drink alcohol or use drugs.  No Known Allergies  Family History  Problem Relation Age of Onset  . CVA Mother   . Dementia Father    Mother deceased age 19 had  a prior stroke. Father deceased in his 35s per patient was senile.  Prior to Admission medications   Medication Sig Start Date End Date Taking? Authorizing Provider  acetaminophen (TYLENOL) 325 MG tablet  Take 650 mg by mouth every 4 (four) hours as needed for mild pain.   Yes Historical Provider, MD  amLODipine (NORVASC) 2.5 MG tablet Take 2.5 mg by mouth daily.  07/12/16  Yes Historical Provider, MD  famotidine (PEPCID) 20 MG tablet Take 1 tablet (20 mg total) by mouth daily. 08/22/16  Yes Debbe Odea, MD  fluticasone (FLONASE) 50 MCG/ACT nasal spray Place into both nostrils daily.   Yes Historical Provider, MD  Lactobacillus (ACIDOPHILUS PO) Take 1 capsule by mouth daily. Control UTI   Yes Historical Provider, MD  loperamide (IMODIUM) 2 MG capsule Take 2 mg by mouth 4 (four) times daily as needed for diarrhea or loose stools.   Yes Historical Provider, MD  menthol-zinc oxide (GOLD BOND) powder Apply 1 application topically daily.   Yes Historical Provider, MD  Multiple Vitamins-Minerals (DAILY MULTI 50+ PO) Take 1 tablet by mouth daily.   Yes Historical Provider, MD  nystatin cream (MYCOSTATIN) Apply 1 application topically daily as needed for irritation or rash. 07/29/16  Yes Historical Provider, MD  simethicone (MYLICON) 664 MG chewable tablet Chew 125 mg by mouth every 6 (six) hours as needed for flatulence.   Yes Historical Provider, MD  Elastic Bandages & Supports (T.E.D. BELOW KNEE/M-REGULAR) MISC On in AM and off in PM. Medical supply store to measure and give appropriate size. Moderate compression. 08/21/16   Debbe Odea, MD  guaiFENesin (MUCINEX) 600 MG 12 hr tablet Take 1 tablet (600 mg total) by mouth 2 (two) times daily as needed. Patient not taking: Reported on 09/21/2016 08/21/16   Debbe Odea, MD  senna-docusate (SENOKOT-S) 8.6-50 MG tablet Take 1 tablet by mouth at bedtime as needed for mild constipation. Patient not taking: Reported on 09/21/2016 08/21/16   Debbe Odea, MD    Physical Exam: Vitals:   09/21/16 0846 09/21/16 1126  BP: 128/81 120/81  Pulse: 116 103  Resp: 18 16  Temp: 99 F (37.2 C)   TempSrc: Oral   SpO2: 97% 99%      Constitutional: NAD, calm, comfortable.  Edentulous. Vitals:   09/21/16 0846 09/21/16 1126  BP: 128/81 120/81  Pulse: 116 103  Resp: 18 16  Temp: 99 F (37.2 C)   TempSrc: Oral   SpO2: 97% 99%   Eyes: PERRLA, lids and conjunctivae normal ENMT: Mucous membranes are extremely dry. Posterior pharynx with whitish exudates also noted on the tongue. Edentulous.  Neck: normal, supple, no masses, no thyromegaly Respiratory: clear to auscultation bilaterally, no wheezing, no crackles. Normal respiratory effort. No accessory muscle use.  Cardiovascular: Regular rate and rhythm, no murmurs / rubs / gallops. 1-2+ bilateral lower extremity edema. 2+ pedal pulses. No carotid bruits.  Abdomen: no tenderness, no masses palpated. No hepatosplenomegaly. Bowel sounds positive.  Musculoskeletal: no clubbing / cyanosis. No joint deformity upper and lower extremities. Good ROM, no contractures. Normal muscle tone.  Skin: no rashes, lesions, ulcers. No induration Neurologic: CN 2-12 grossly intact. Sensation intact, DTR normal. Strength 5/5 in all 4.  Psychiatric: Normal judgment and insight. Alert and oriented x 3. Normal mood.    Labs on Admission: I have personally reviewed following labs and imaging studies  CBC:  Recent Labs Lab 09/21/16 0905  WBC 7.0  HGB 10.1*  HCT 33.9*  MCV 79.0  PLT 278  Basic Metabolic Panel:  Recent Labs Lab 09/21/16 0905  NA 136  K 4.0  CL 99*  CO2 28  GLUCOSE 124*  BUN 13  CREATININE 0.35*  CALCIUM 7.9*   GFR: Estimated Creatinine Clearance: 47.4 mL/min (by C-G formula based on SCr of 0.35 mg/dL (L)). Liver Function Tests:  Recent Labs Lab 09/21/16 0905  AST 28  ALT 13*  ALKPHOS 159*  BILITOT 0.4  PROT 6.0*  ALBUMIN 1.9*    Recent Labs Lab 09/21/16 0905  LIPASE 12   No results for input(s): AMMONIA in the last 168 hours. Coagulation Profile: No results for input(s): INR, PROTIME in the last 168 hours. Cardiac Enzymes: No results for input(s): CKTOTAL, CKMB, CKMBINDEX,  TROPONINI in the last 168 hours. BNP (last 3 results) No results for input(s): PROBNP in the last 8760 hours. HbA1C: No results for input(s): HGBA1C in the last 72 hours. CBG: No results for input(s): GLUCAP in the last 168 hours. Lipid Profile: No results for input(s): CHOL, HDL, LDLCALC, TRIG, CHOLHDL, LDLDIRECT in the last 72 hours. Thyroid Function Tests: No results for input(s): TSH, T4TOTAL, FREET4, T3FREE, THYROIDAB in the last 72 hours. Anemia Panel: No results for input(s): VITAMINB12, FOLATE, FERRITIN, TIBC, IRON, RETICCTPCT in the last 72 hours. Urine analysis:    Component Value Date/Time   COLORURINE YELLOW 09/21/2016 1419   APPEARANCEUR TURBID (A) 09/21/2016 1419   LABSPEC >1.046 (H) 09/21/2016 1419   PHURINE 7.0 09/21/2016 1419   GLUCOSEU NEGATIVE 09/21/2016 1419   HGBUR MODERATE (A) 09/21/2016 1419   BILIRUBINUR NEGATIVE 09/21/2016 1419   KETONESUR NEGATIVE 09/21/2016 1419   PROTEINUR 100 (A) 09/21/2016 1419   UROBILINOGEN 0.2 11/11/2010 0400   NITRITE NEGATIVE 09/21/2016 1419   LEUKOCYTESUR LARGE (A) 09/21/2016 1419   Sepsis Labs: !!!!!!!!!!!!!!!!!!!!!!!!!!!!!!!!!!!!!!!!!!!! @LABRCNTIP (procalcitonin:4,lacticidven:4) )No results found for this or any previous visit (from the past 240 hour(s)).   Radiological Exams on Admission: Ct Abdomen Pelvis W Contrast  Result Date: 09/21/2016 CLINICAL DATA:  Lower abdominal pain, vomiting. EXAM: CT ABDOMEN AND PELVIS WITH CONTRAST TECHNIQUE: Multidetector CT imaging of the abdomen and pelvis was performed using the standard protocol following bolus administration of intravenous contrast. CONTRAST:  13m ISOVUE-300 IOPAMIDOL (ISOVUE-300) INJECTION 61% COMPARISON:  11/11/2010 FINDINGS: Lower chest: Mild cardiomegaly. Dense mitral valve annular calcifications. Dependent atelectasis in the lung bases. Trace bilateral pleural effusions. Hepatobiliary: Prior cholecystectomy. Common bile duct and intrahepatic ducts are slightly  prominent, likely related to post cholecystectomy state. Common bile duct 13 mm in the pancreatic head. Pancreas: Diffuse pancreatic atrophy. No duct dilatation or focal abnormality. Dense calcifications throughout the splenic vein in the region of the pancreas. Spleen: Small low-density areas in the spleen are stable since prior study, likely small cysts. Adrenals/Urinary Tract: 3.5 cm cyst in the midpole of the right kidney, slightly larger than prior study. No stones or hydronephrosis. Adrenal glands unremarkable. Gas noted within the urinary bladder, presumably from recent catheterization. Stomach/Bowel: Sigmoid diverticulosis. There is a large mass like heterogeneous area in the sigmoid colon with surrounding inflammatory change. This is concerning for large colonic mass/ tumor. This is difficult to measure due to irregular margins and diffuse nature of the process. Heterogeneous enhancing area on image 71 measures 10.1 x 5.9 cm. This masslike area abuts the superior aspect of the urinary bladder. Large stool burden noted throughout the remainder of the colon. Stomach and small bowel are decompressed. Vascular/Lymphatic: Aorta is tortuous as are the iliac vessels. No aneurysm. No adenopathy. Reproductive: Uterus and adnexa unremarkable.  Calcifications throughout the uterine wall. Other: Small amount of free fluid in the pelvis.  No free air. Musculoskeletal: No acute bony abnormality. Degenerative disc and facet disease throughout the lumbar spine. IMPRESSION: Large heterogeneous enhancing mass like area involving the sigmoid colon. While there are extensive diverticular of within the sigmoid colon, I feel this is most likely a colon mass/ tumor. This abuts the bladder. There is gas within the urinary bladder which is presumably from catheterization. If the patient has not been catheterized, this would be concerning for fistula to the adjacent abnormal sigmoid colon. Small stable cystic areas within the spleen.  Prior cholecystectomy. Dilated common bile duct and intrahepatic ducts likely related to post cholecystectomy state. Aorta iliac atherosclerosis. Dependent atelectasis in the lung bases. Trace bilateral pleural effusions. Electronically Signed   By: Rolm Baptise M.D.   On: 09/21/2016 12:41    EKG: Not done  Assessment/Plan Principal Problem:   Colonic mass Active Problems:   Essential hypertension   Dehydration, mild   UTI (urinary tract infection)   FTT (failure to thrive) in adult   Oral thrush   Diarrhea   Anemia   #1 colonic mass Noted on CT abdomen and pelvis. Patient has had some worsening diarrhea which seems like stools and around the mass, 40 pound weight loss over the past 6 months, early satiety, decreased oral intake, recurrent oral thrush and generalized weakness. Concern for neoplastic mass. GI has been consulted and patient to be scheduled for flexible sigmoidoscopy or colonoscopy tomorrow 09/22/2016 for further evaluation and management. Patient will be on clear liquids and subsequently nothing by mouth early tomorrow morning. We'll place on IV fluids. Supportive care. GI following and appreciate their input and recommendations.  #2 dehydration IV fluids.  #3 probable UTI Urine cultures pending. Place on IV Rocephin.  #4 failure to thrive Likely secondary to problem #1. Supportive care. IV fluids.  #5 oral thrush Patient likely immunocompromised from possible colonic mass. Patient with recurrent oral thrush per daughter. Place on IV Diflucan, nystatin oral.  #6 anemia Patient with no overt bleeding. Check an anemia panel. Follow H&H.  #7 diarrhea Likely secondary to stool going around probable mass noted on CT abdomen and pelvis. Patient for flexible sigmoidoscopy or colonoscopy tomorrow per GI. Follow.   DVT prophylaxis: Lovenox Code Status: DO NOT RESUSCITATE Family Communication: Updated patient and daughter at bedside. Disposition Plan: ALF vs SNF vs  Independent living when work up is completed. Consults called: [facility]: Dr Watt Climes Admission status: Place in observation.   [facility] MD [facility] Pager [phone]  If 7PM-7AM, please contact night-coverage [email or web address] Password [email or web address]  09/21/2016, 4:10 PM

## 2016-09-21 NOTE — ED Triage Notes (Signed)
Per pt/daughter-states diarrhea since October-states has been worked up-C-Diff came back negative in [employer] saw PCP on the 26 th-saw GI on the 28 th-x-rays done-states possible SBO-states PNA was resolved-N/V for 3 days-has lost a lot of weight

## 2016-09-21 NOTE — ED Provider Notes (Signed)
[facility] DEPT Provider Note   CSN: [id number] Arrival date & time: 09/21/16  [id number]     History   Chief Complaint Chief Complaint  Patient presents with  . Diarrhea    HPI Emily Mills is a 83 y.o. female.  The history is provided by the patient and a relative. No language interpreter was used.  Diarrhea     Emily Mills is a 83 y.o. female who presents to the Emergency Department complaining of diarrhea.  History is provided by the patient and her daughter. She has experienced diarrhea since October with up to 5 loose and watery bowel movements daily. She was admitted to the hospital in October for worsening symptoms and was diagnosed with pneumonia and UTI and admitted to the hospital. She was discharged to [facility] for rehabilitation due to weakness after her hospitalization and went home on December 22. She had a C. difficile study per performed prior to discharge home. She saw GI and December 28 for ongoing diarrhea and x-ray was performed that was concerning for bowel obstruction. She tried MiraLAX to see if there was stool blockage that would improve and had severe worsening in her diarrhea and progressive generalized weakness. She endorses intermittent left lower quadrant abdominal pain since October. She has intermittent vomiting, one episode daily over the last week. No fevers, chest pain, shortness of breath. She does have progressive lower extremities swelling and progressive generalized weakness. Past Medical History:  Diagnosis Date  . Dehydration, mild 08/2016  . FTT (failure to thrive) in adult 08/2016  . Hypertension   . Hyponatremia 08/2016  . Lobar pneumonia, unspecified organism ([place]) 08/2016  . Pre-diabetes   . SIRS (systemic inflammatory response syndrome) ([place]) 08/2016  . Skin cancer   . UTI (urinary tract infection) 08/2016    Patient Active Problem List   Diagnosis Date Noted  . Colonic mass 09/21/2016  . Diarrhea 09/21/2016  . Anemia  09/21/2016  . Sepsis ([place]) 08/21/2016  . Glucose intolerance (impaired glucose tolerance) 08/21/2016  . Oral thrush 08/21/2016  . Abnormal thyroid function test 08/21/2016  . Lobar pneumonia, unspecified organism ([place]) 08/18/2016  . Essential hypertension 08/18/2016  . Dehydration, mild 08/18/2016  . Hyponatremia 08/18/2016  . UTI (urinary tract infection) 08/18/2016  . FTT (failure to thrive) in adult 08/18/2016  . Frequent falls 08/18/2016    Past Surgical History:  Procedure Laterality Date  . CHOLECYSTECTOMY     30 years ago    OB History    No data available       Home Medications    Prior to Admission medications   Medication Sig Start Date End Date Taking? Authorizing Provider  acetaminophen (TYLENOL) 325 MG tablet Take 650 mg by mouth every 4 (four) hours as needed for mild pain.   Yes Historical Provider, MD  amLODipine (NORVASC) 2.5 MG tablet Take 2.5 mg by mouth daily.  07/12/16  Yes Historical Provider, MD  famotidine (PEPCID) 20 MG tablet Take 1 tablet (20 mg total) by mouth daily. 08/22/16  Yes Debbe Odea, MD  fluticasone (FLONASE) 50 MCG/ACT nasal spray Place into both nostrils daily.   Yes Historical Provider, MD  Lactobacillus (ACIDOPHILUS PO) Take 1 capsule by mouth daily. Control UTI   Yes Historical Provider, MD  loperamide (IMODIUM) 2 MG capsule Take 2 mg by mouth 4 (four) times daily as needed for diarrhea or loose stools.   Yes Historical Provider, MD  menthol-zinc oxide (GOLD BOND) powder Apply 1 application topically daily.  Yes Historical Provider, MD  Multiple Vitamins-Minerals (DAILY MULTI 50+ PO) Take 1 tablet by mouth daily.   Yes Historical Provider, MD  nystatin cream (MYCOSTATIN) Apply 1 application topically daily as needed for irritation or rash. 07/29/16  Yes Historical Provider, MD  simethicone (MYLICON) 0000000 MG chewable tablet Chew 125 mg by mouth every 6 (six) hours as needed for flatulence.   Yes Historical Provider, MD  Elastic Bandages  & Supports (T.E.D. BELOW KNEE/M-REGULAR) MISC On in AM and off in PM. Medical supply store to measure and give appropriate size. Moderate compression. 08/21/16   Debbe Odea, MD  guaiFENesin (MUCINEX) 600 MG 12 hr tablet Take 1 tablet (600 mg total) by mouth 2 (two) times daily as needed. Patient not taking: Reported on 09/21/2016 08/21/16   Debbe Odea, MD  senna-docusate (SENOKOT-S) 8.6-50 MG tablet Take 1 tablet by mouth at bedtime as needed for mild constipation. Patient not taking: Reported on 09/21/2016 08/21/16   Debbe Odea, MD    Family History Family History  Problem Relation Age of Onset  . CVA Mother   . Dementia Father     Social History Social History  Substance Use Topics  . Smoking status: Never Smoker  . Smokeless tobacco: Never Used  . Alcohol use No     Allergies   Patient has no known allergies.   Review of Systems Review of Systems  Gastrointestinal: Positive for diarrhea.  All other systems reviewed and are negative.    Physical Exam Updated Vital Signs BP 120/81 (BP Location: Left Arm)   Pulse 103   Temp 99 F (37.2 C) (Oral)   Resp 16   SpO2 99%   Physical Exam  Constitutional: She is oriented to person, place, and time. She appears well-developed and well-nourished.  HENT:  Head: Normocephalic and atraumatic.  Cardiovascular: Regular rhythm.   No murmur heard. Tachycardic  Pulmonary/Chest: Effort normal and breath sounds normal. No respiratory distress.  Abdominal:  Mild abdominal distention with mild diffuse abdominal tenderness  Musculoskeletal: She exhibits no tenderness.  1+ pitting edema to bilateral lower extremities  Neurological: She is alert and oriented to person, place, and time.  Skin: Skin is warm and dry.  Psychiatric: She has a normal mood and affect. Her behavior is normal.  Nursing note and vitals reviewed.    ED Treatments / Results  Labs (all labs ordered are listed, but only abnormal results are displayed) Labs  Reviewed  COMPREHENSIVE METABOLIC PANEL - Abnormal; Notable for the following:       Result Value   Chloride 99 (*)    Glucose, Bld 124 (*)    Creatinine, Ser 0.35 (*)    Calcium 7.9 (*)    Total Protein 6.0 (*)    Albumin 1.9 (*)    ALT 13 (*)    Alkaline Phosphatase 159 (*)    All other components within normal limits  CBC - Abnormal; Notable for the following:    Hemoglobin 10.1 (*)    HCT 33.9 (*)    MCH 23.5 (*)    MCHC 29.8 (*)    RDW 15.7 (*)    All other components within normal limits  URINALYSIS, ROUTINE W REFLEX MICROSCOPIC - Abnormal; Notable for the following:    APPearance TURBID (*)    Specific Gravity, Urine >1.046 (*)    Hgb urine dipstick MODERATE (*)    Protein, ur 100 (*)    Leukocytes, UA LARGE (*)    Bacteria, UA MANY (*)  Non Squamous Epithelial 6-30 (*)    All other components within normal limits  URINE CULTURE  LIPASE, BLOOD  MAGNESIUM  VITAMIN B12  FOLATE  IRON AND TIBC  FERRITIN  COMPREHENSIVE METABOLIC PANEL  CBC  PROTIME-INR  APTT    EKG  EKG Interpretation None       Radiology Ct Abdomen Pelvis W Contrast  Result Date: 09/21/2016 CLINICAL DATA:  Lower abdominal pain, vomiting. EXAM: CT ABDOMEN AND PELVIS WITH CONTRAST TECHNIQUE: Multidetector CT imaging of the abdomen and pelvis was performed using the standard protocol following bolus administration of intravenous contrast. CONTRAST:  160mL ISOVUE-300 IOPAMIDOL (ISOVUE-300) INJECTION 61% COMPARISON:  11/11/2010 FINDINGS: Lower chest: Mild cardiomegaly. Dense mitral valve annular calcifications. Dependent atelectasis in the lung bases. Trace bilateral pleural effusions. Hepatobiliary: Prior cholecystectomy. Common bile duct and intrahepatic ducts are slightly prominent, likely related to post cholecystectomy state. Common bile duct 13 mm in the pancreatic head. Pancreas: Diffuse pancreatic atrophy. No duct dilatation or focal abnormality. Dense calcifications throughout the splenic  vein in the region of the pancreas. Spleen: Small low-density areas in the spleen are stable since prior study, likely small cysts. Adrenals/Urinary Tract: 3.5 cm cyst in the midpole of the right kidney, slightly larger than prior study. No stones or hydronephrosis. Adrenal glands unremarkable. Gas noted within the urinary bladder, presumably from recent catheterization. Stomach/Bowel: Sigmoid diverticulosis. There is a large mass like heterogeneous area in the sigmoid colon with surrounding inflammatory change. This is concerning for large colonic mass/ tumor. This is difficult to measure due to irregular margins and diffuse nature of the process. Heterogeneous enhancing area on image 71 measures 10.1 x 5.9 cm. This masslike area abuts the superior aspect of the urinary bladder. Large stool burden noted throughout the remainder of the colon. Stomach and small bowel are decompressed. Vascular/Lymphatic: Aorta is tortuous as are the iliac vessels. No aneurysm. No adenopathy. Reproductive: Uterus and adnexa unremarkable. Calcifications throughout the uterine wall. Other: Small amount of free fluid in the pelvis.  No free air. Musculoskeletal: No acute bony abnormality. Degenerative disc and facet disease throughout the lumbar spine. IMPRESSION: Large heterogeneous enhancing mass like area involving the sigmoid colon. While there are extensive diverticular of within the sigmoid colon, I feel this is most likely a colon mass/ tumor. This abuts the bladder. There is gas within the urinary bladder which is presumably from catheterization. If the patient has not been catheterized, this would be concerning for fistula to the adjacent abnormal sigmoid colon. Small stable cystic areas within the spleen. Prior cholecystectomy. Dilated common bile duct and intrahepatic ducts likely related to post cholecystectomy state. Aorta iliac atherosclerosis. Dependent atelectasis in the lung bases. Trace bilateral pleural effusions.  Electronically Signed   By: Rolm Baptise M.D.   On: 09/21/2016 12:41    Procedures Procedures (including critical care time)  Medications Ordered in ED Medications  iopamidol (ISOVUE-300) 61 % injection (not administered)  0.9 %  sodium chloride infusion ( Intravenous New Bag/Given 09/21/16 1505)  polyethylene glycol-electrolytes (NuLYTELY/GoLYTELY) solution 4,000 mL (not administered)  fluticasone (FLONASE) 50 MCG/ACT nasal spray 1 spray (1 spray Each Nare Not Given 09/21/16 1718)  simethicone (MYLICON) chewable tablet 80 mg (not administered)  nystatin cream (MYCOSTATIN) 1 application (not administered)  enoxaparin (LOVENOX) injection 40 mg (not administered)  acetaminophen (TYLENOL) tablet 650 mg (not administered)    Or  acetaminophen (TYLENOL) suppository 650 mg (not administered)  traMADol (ULTRAM) tablet 50 mg (not administered)  ketorolac (TORADOL) 15 MG/ML injection 15  mg (not administered)  senna (SENOKOT) tablet 8.6 mg (not administered)  sorbitol 70 % solution 30 mL (not administered)  sodium phosphate (FLEET) 7-19 GM/118ML enema 1 enema (not administered)  ondansetron (ZOFRAN) tablet 4 mg (not administered)    Or  ondansetron (ZOFRAN) injection 4 mg (not administered)  fluconazole (DIFLUCAN) IVPB 200 mg (not administered)  cefTRIAXone (ROCEPHIN) 1 g in dextrose 5 % 50 mL IVPB (not administered)  nystatin (MYCOSTATIN) 100000 UNIT/ML suspension 500,000 Units (500,000 Units Oral Not Given 09/21/16 1718)  fluconazole (DIFLUCAN) IVPB 100 mg (not administered)  morphine 2 MG/ML injection 1-2 mg (not administered)  sodium chloride 0.9 % bolus 500 mL (0 mLs Intravenous Stopped 09/21/16 1100)  iopamidol (ISOVUE-300) 61 % injection 100 mL (100 mLs Intravenous Contrast Given 09/21/16 1201)  fentaNYL (SUBLIMAZE) injection 25 mcg (25 mcg Intravenous Given 09/21/16 1520)     Initial Impression / Assessment and Plan / ED Course  I have reviewed the triage vital signs and the nursing  notes.  Pertinent labs & imaging results that were available during my care of the patient were reviewed by me and considered in my medical decision making (see chart for details).  Clinical Course    Pt here with diarrhea since October, vomiting for one week, progressive weakness. CT abd with colonic mass.  She is mildly dehydrated on exam, providing IVF.    D/w Dr. Watt Climes with [facility] - will see the patient in consult. Recommends clear liquid diet.  Hospitalist consulted for admission for further treatment and work up.    Patient and daughter updated of findings of studies.     Final Clinical Impressions(s) / ED Diagnoses   Final diagnoses:  None    New Prescriptions Current Discharge Medication List       Quintella Reichert, MD 09/21/16 1725

## 2016-09-21 NOTE — Progress Notes (Addendum)
Patient seen last week by my partner in the office and his note and previous workup was reviewed and she initially refused colonoscopy but now willing to have it as per ER physician and CT reviewed and have scheduled for flexible sigmoidoscopy or colonoscopy tomorrow midday and will discuss with patient beforehand and orders written for above

## 2016-09-22 ENCOUNTER — Observation Stay (HOSPITAL_COMMUNITY): Payer: Medicare Other | Admitting: Certified Registered"

## 2016-09-22 ENCOUNTER — Encounter (HOSPITAL_COMMUNITY): Payer: Self-pay

## 2016-09-22 ENCOUNTER — Encounter (HOSPITAL_COMMUNITY)
Admission: EM | Disposition: A | Discharge status: Skilled Nursing Facility | Payer: Self-pay | Source: Home / Self Care | Attending: Internal Medicine

## 2016-09-22 DIAGNOSIS — M6281 Muscle weakness (generalized): Secondary | ICD-10-CM | POA: Diagnosis not present

## 2016-09-22 DIAGNOSIS — K5730 Diverticulosis of large intestine without perforation or abscess without bleeding: Secondary | ICD-10-CM | POA: Diagnosis present

## 2016-09-22 DIAGNOSIS — N390 Urinary tract infection, site not specified: Secondary | ICD-10-CM | POA: Diagnosis present

## 2016-09-22 DIAGNOSIS — D649 Anemia, unspecified: Secondary | ICD-10-CM | POA: Diagnosis not present

## 2016-09-22 DIAGNOSIS — K219 Gastro-esophageal reflux disease without esophagitis: Secondary | ICD-10-CM | POA: Diagnosis present

## 2016-09-22 DIAGNOSIS — D500 Iron deficiency anemia secondary to blood loss (chronic): Secondary | ICD-10-CM | POA: Diagnosis present

## 2016-09-22 DIAGNOSIS — E43 Unspecified severe protein-calorie malnutrition: Secondary | ICD-10-CM | POA: Diagnosis present

## 2016-09-22 DIAGNOSIS — K644 Residual hemorrhoidal skin tags: Secondary | ICD-10-CM | POA: Diagnosis present

## 2016-09-22 DIAGNOSIS — E860 Dehydration: Secondary | ICD-10-CM | POA: Diagnosis present

## 2016-09-22 DIAGNOSIS — D509 Iron deficiency anemia, unspecified: Secondary | ICD-10-CM

## 2016-09-22 DIAGNOSIS — E876 Hypokalemia: Secondary | ICD-10-CM | POA: Diagnosis not present

## 2016-09-22 DIAGNOSIS — Z66 Do not resuscitate: Secondary | ICD-10-CM | POA: Diagnosis present

## 2016-09-22 DIAGNOSIS — K639 Disease of intestine, unspecified: Secondary | ICD-10-CM | POA: Diagnosis not present

## 2016-09-22 DIAGNOSIS — Z79899 Other long term (current) drug therapy: Secondary | ICD-10-CM | POA: Diagnosis not present

## 2016-09-22 DIAGNOSIS — B370 Candidal stomatitis: Secondary | ICD-10-CM | POA: Diagnosis present

## 2016-09-22 DIAGNOSIS — I10 Essential (primary) hypertension: Secondary | ICD-10-CM | POA: Diagnosis present

## 2016-09-22 DIAGNOSIS — R197 Diarrhea, unspecified: Secondary | ICD-10-CM | POA: Diagnosis not present

## 2016-09-22 DIAGNOSIS — D374 Neoplasm of uncertain behavior of colon: Secondary | ICD-10-CM | POA: Diagnosis present

## 2016-09-22 DIAGNOSIS — K648 Other hemorrhoids: Secondary | ICD-10-CM | POA: Diagnosis present

## 2016-09-22 DIAGNOSIS — R600 Localized edema: Secondary | ICD-10-CM | POA: Diagnosis present

## 2016-09-22 DIAGNOSIS — Z6828 Body mass index (BMI) 28.0-28.9, adult: Secondary | ICD-10-CM | POA: Diagnosis not present

## 2016-09-22 DIAGNOSIS — R0600 Dyspnea, unspecified: Secondary | ICD-10-CM | POA: Diagnosis not present

## 2016-09-22 DIAGNOSIS — D638 Anemia in other chronic diseases classified elsewhere: Secondary | ICD-10-CM | POA: Diagnosis present

## 2016-09-22 DIAGNOSIS — C187 Malignant neoplasm of sigmoid colon: Secondary | ICD-10-CM | POA: Diagnosis present

## 2016-09-22 DIAGNOSIS — Z823 Family history of stroke: Secondary | ICD-10-CM | POA: Diagnosis not present

## 2016-09-22 DIAGNOSIS — K913 Postprocedural intestinal obstruction, unspecified as to partial versus complete: Secondary | ICD-10-CM | POA: Diagnosis not present

## 2016-09-22 DIAGNOSIS — K567 Ileus, unspecified: Secondary | ICD-10-CM | POA: Diagnosis not present

## 2016-09-22 DIAGNOSIS — R627 Adult failure to thrive: Secondary | ICD-10-CM | POA: Diagnosis present

## 2016-09-22 DIAGNOSIS — Z85820 Personal history of malignant melanoma of skin: Secondary | ICD-10-CM | POA: Diagnosis not present

## 2016-09-22 DIAGNOSIS — M79609 Pain in unspecified limb: Secondary | ICD-10-CM | POA: Diagnosis not present

## 2016-09-22 HISTORY — PX: FLEXIBLE SIGMOIDOSCOPY: SHX5431

## 2016-09-22 LAB — TYPE AND SCREEN
ABO/RH(D): O POS
Antibody Screen: NEGATIVE

## 2016-09-22 LAB — PROTIME-INR
INR: 1.2
Prothrombin Time: 15.3 seconds — ABNORMAL HIGH (ref 11.4–15.2)

## 2016-09-22 LAB — COMPREHENSIVE METABOLIC PANEL
ALBUMIN: 1.6 g/dL — AB (ref 3.5–5.0)
ALT: 11 U/L — AB (ref 14–54)
AST: 25 U/L (ref 15–41)
Alkaline Phosphatase: 153 U/L — ABNORMAL HIGH (ref 38–126)
Anion gap: 8 (ref 5–15)
BUN: 11 mg/dL (ref 6–20)
CHLORIDE: 103 mmol/L (ref 101–111)
CO2: 24 mmol/L (ref 22–32)
Calcium: 7.3 mg/dL — ABNORMAL LOW (ref 8.9–10.3)
Creatinine, Ser: 0.52 mg/dL (ref 0.44–1.00)
GFR calc Af Amer: >60 mL/min (ref >60)
GFR calc non Af Amer: >60 mL/min (ref >60)
GLUCOSE: 106 mg/dL — AB (ref 65–99)
POTASSIUM: 3.7 mmol/L (ref 3.5–5.1)
Sodium: 135 mmol/L (ref 135–145)
TOTAL PROTEIN: 4.9 g/dL — AB (ref 6.5–8.1)
Total Bilirubin: 0.6 mg/dL (ref 0.3–1.2)

## 2016-09-22 LAB — CBC
HEMATOCRIT: 29.8 % — AB (ref 36.0–46.0)
Hemoglobin: 9.1 g/dL — ABNORMAL LOW (ref 12.0–15.0)
MCH: 24.7 pg — ABNORMAL LOW (ref 26.0–34.0)
MCHC: 30.5 g/dL (ref 30.0–36.0)
MCV: 80.8 fL (ref 78.0–100.0)
Platelets: 285 10*3/uL (ref 150–400)
RBC: 3.69 MIL/uL — ABNORMAL LOW (ref 3.87–5.11)
RDW: 15.8 % — AB (ref 11.5–15.5)
WBC: 5.8 10*3/uL (ref 4.0–10.5)

## 2016-09-22 LAB — APTT: aPTT: 33 seconds (ref 24–36)

## 2016-09-22 LAB — URINE CULTURE

## 2016-09-22 LAB — PREALBUMIN

## 2016-09-22 LAB — ABO/RH: ABO/RH(D): O POS

## 2016-09-22 SURGERY — SIGMOIDOSCOPY, FLEXIBLE
Anesthesia: Monitor Anesthesia Care

## 2016-09-22 MED ORDER — LIDOCAINE 2% (20 MG/ML) 5 ML SYRINGE
INTRAMUSCULAR | Status: AC
Start: 2016-09-22 — End: 2016-09-22
  Filled 2016-09-22: qty 5

## 2016-09-22 MED ORDER — PROPOFOL 10 MG/ML IV BOLUS
INTRAVENOUS | Status: DC | PRN
  Administered 2016-09-22 (×2): 20 mg via INTRAVENOUS
  Administered 2016-09-22: 30 mg via INTRAVENOUS
  Administered 2016-09-22 (×4): 20 mg via INTRAVENOUS

## 2016-09-22 MED ORDER — FENTANYL CITRATE (PF) 100 MCG/2ML IJ SOLN
INTRAMUSCULAR | Status: AC
  Filled 2016-09-22: qty 2

## 2016-09-22 MED ORDER — MIDAZOLAM HCL 5 MG/ML IJ SOLN
INTRAMUSCULAR | Status: AC
  Filled 2016-09-22: qty 2

## 2016-09-22 MED ORDER — PROPOFOL 10 MG/ML IV BOLUS
INTRAVENOUS | Status: AC
  Filled 2016-09-22: qty 20

## 2016-09-22 NOTE — Progress Notes (Signed)
Pt not in room at this time- will recheck on pt next day.  Seligman, Douglas

## 2016-09-22 NOTE — Anesthesia Postprocedure Evaluation (Signed)
Anesthesia Post Note  Patient: Emily Mills  Procedure(s) Performed: Procedure(s) (LRB): FLEXIBLE SIGMOIDOSCOPY (N/A)  Patient location during evaluation: Endoscopy Anesthesia Type: MAC Level of consciousness: awake and alert Pain management: pain level controlled Vital Signs Assessment: post-procedure vital signs reviewed and stable Respiratory status: spontaneous breathing, nonlabored ventilation, respiratory function stable and patient connected to nasal cannula oxygen Cardiovascular status: stable and blood pressure returned to baseline Anesthetic complications: no       Last Vitals:  Vitals:   09/22/16 1320 09/22/16 1330  BP: 123/71 (!) 149/75  Pulse: 93 94  Resp: (!) 23 17  Temp:      Last Pain:  Vitals:   09/22/16 1313  TempSrc: Oral  PainSc:                  Catalina Gravel

## 2016-09-22 NOTE — Evaluation (Signed)
Physical Therapy Evaluation Patient Details Name: Izellah Lesner MRN: [id number] DOB: [birth year] Today's Date: 09/22/2016   History of Present Illness  HPI: Tanaja Yarish is a 83 y.o. female with medical history significant of HTN, prior hx of melanoma s/p resection, GERD, presents to the ED with complaints of worsening diarrhea, worsening generalized weakness, lightheadedness and a 40 pound weight loss over the past 6 months.; pt was recentlly admitted with frequent falls  Clinical Impression  Pt admitted with above diagnosis. Pt currently with functional limitations due to the deficits listed below (see PT Problem List).  Pt will benefit from skilled PT to increase their independence and safety with mobility to allow discharge to the venue listed below.   Pt is initially agreeable to PT then adamantly refuses, becomes agitated and yells she "won't do anything"; PT was able to calm pt and she then  became very emotional and reported she is very concerned about the "test" today; she has also been refusing bowel prep per nursing; she refused to go to bathroom even though she had soiled herself. Pt may benefit from Palliative Care Consult to assist pt and family in goals of care; recommending return to SNF at this time if this is possible;  Will continue to follow.     Follow Up Recommendations SNF    Equipment Recommendations  None recommended by PT    Recommendations for Other Services       Precautions / Restrictions Precautions Precautions: Fall      Mobility  Bed Mobility Overal bed mobility: Needs Assistance Bed Mobility: Rolling Rolling: Min assist         General bed mobility comments: pt initally begins to assist, then adamantly refuses any and all mobility  Transfers                 General transfer comment: NT--pt refused  Ambulation/Gait                Stairs            Wheelchair Mobility    Modified Rankin (Stroke Patients Only)       Balance Overall balance assessment: History of Falls                                           Pertinent Vitals/Pain Pain Assessment: No/denies pain    Home Living Family/patient expects to be discharged to:: Unsure                 Additional Comments: pt resides at [facility]; she has been in rehab since last admission in Dec     Prior Function Level of Independence: Independent with assistive device(s)         Comments: mod I amb with walker prior to last adm     Hand Dominance        Extremity/Trunk Assessment   Upper Extremity Assessment Upper Extremity Assessment: RUE deficits/detail;LUE deficits/detail RUE Deficits / Details: AROM grossly WFL, strength grossly 3+/5 LUE Deficits / Details: same as above    Lower Extremity Assessment Lower Extremity Assessment: RLE deficits/detail;LLE deficits/detail RLE Deficits / Details: AROM WFL, strength 3+/5 LLE Deficits / Details: same as above       Communication   Communication: No difficulties  Cognition Arousal/Alertness: Awake/alert Behavior During Therapy: WFL for tasks assessed/performed Overall Cognitive Status: Impaired/Different from baseline Area of Impairment: Orientation  General Comments: pt states the year is "1780", able to report month and date (3rd), and location; she is unable to recall where she was in rehab;     General Comments      Exercises     Assessment/Plan    PT Assessment Patient needs continued PT services  PT Problem List Decreased activity tolerance;Decreased balance;Decreased cognition;Decreased mobility;Decreased safety awareness          PT Treatment Interventions DME instruction;Gait training;Functional mobility training;Therapeutic exercise;Therapeutic activities;Balance training;Patient/family education    PT Goals (Current goals can be found in the Care Plan section)  Acute Rehab PT Goals Patient Stated  Goal: none stated "I don't know" PT Goal Formulation: With patient Time For Goal Achievement: 10/06/16 Potential to Achieve Goals: Good    Frequency Min 3X/week   Barriers to discharge Decreased caregiver support      Co-evaluation               End of Session   Activity Tolerance: Patient limited by fatigue;Other (comment) (pt is easily agitated by mobility attempts) Patient left: in bed;with call bell/phone within reach;with bed alarm set Nurse Communication: Mobility status    Functional Assessment Tool Used: clinical judgement Functional Limitation: Mobility: Walking and moving around Mobility: Walking and Moving Around Current Status [id number]): At least 20 percent but less than 40 percent impaired, limited or restricted Mobility: Walking and Moving Around Goal Status [phone]): At least 1 percent but less than 20 percent impaired, limited or restricted    Time: 0935-0951 PT Time Calculation (min) (ACUTE ONLY): 16 min   Charges:         PT G Codes:   PT G-Codes **NOT FOR INPATIENT CLASS** Functional Assessment Tool Used: clinical judgement Functional Limitation: Mobility: Walking and moving around Mobility: Walking and Moving Around Current Status [id number]): At least 20 percent but less than 40 percent impaired, limited or restricted Mobility: Walking and Moving Around Goal Status [phone]): At least 1 percent but less than 20 percent impaired, limited or restricted    [facility] 09/22/2016, 10:00 AM

## 2016-09-22 NOTE — Consult Note (Signed)
Reason for Consult: Abnormal CT Referring Physician: Hospital team  Emily Mills is an 83 y.o. female.  HPI: Patient seen and examined and case discussed with her daughter as well and her hospital computer chart was reviewed and our office computer chart reviewed as well and she has had some probable overflow incontinence for a month and some urinary problems as well and she's never had a colonoscopy before and her CT implied a sigmoid mass and she herself has no other complaints  Past Medical History:  Diagnosis Date  . Dehydration, mild 08/2016  . FTT (failure to thrive) in adult 08/2016  . Hypertension   . Hyponatremia 08/2016  . Lobar pneumonia, unspecified organism ([place]) 08/2016  . Pre-diabetes   . SIRS (systemic inflammatory response syndrome) ([place]) 08/2016  . Skin cancer   . UTI (urinary tract infection) 08/2016    Past Surgical History:  Procedure Laterality Date  . CHOLECYSTECTOMY     30 years ago    Family History  Problem Relation Age of Onset  . CVA Mother   . Dementia Father     Social History:  reports that she has never smoked. She has never used smokeless tobacco. She reports that she does not drink alcohol or use drugs.  Allergies: No Known Allergies  Medications: I have reviewed the patient's current medications.  Results for orders placed or performed during the hospital encounter of 09/21/16 (from the past 48 hour(s))  Lipase, blood     Status: None   Collection Time: 09/21/16  9:05 AM  Result Value Ref Range   Lipase 12 11 - 51 U/L  Comprehensive metabolic panel     Status: Abnormal   Collection Time: 09/21/16  9:05 AM  Result Value Ref Range   Sodium 136 135 - 145 mmol/L   Potassium 4.0 3.5 - 5.1 mmol/L   Chloride 99 (L) 101 - 111 mmol/L   CO2 28 22 - 32 mmol/L   Glucose, Bld 124 (H) 65 - 99 mg/dL   BUN 13 6 - 20 mg/dL   Creatinine, Ser 0.35 (L) 0.44 - 1.00 mg/dL   Calcium 7.9 (L) 8.9 - 10.3 mg/dL   Total Protein 6.0 (L) 6.5 - 8.1 g/dL    Albumin 1.9 (L) 3.5 - 5.0 g/dL   AST 28 15 - 41 U/L   ALT 13 (L) 14 - 54 U/L   Alkaline Phosphatase 159 (H) 38 - 126 U/L   Total Bilirubin 0.4 0.3 - 1.2 mg/dL   GFR calc non Af Amer >60 >60 mL/min   GFR calc Af Amer >60 >60 mL/min    Comment: (NOTE) The eGFR has been calculated using the CKD EPI equation. This calculation has not been validated in all clinical situations. eGFR's persistently <60 mL/min signify possible Chronic Kidney Disease.    Anion gap 9 5 - 15  CBC     Status: Abnormal   Collection Time: 09/21/16  9:05 AM  Result Value Ref Range   WBC 7.0 4.0 - 10.5 K/uL   RBC 4.29 3.87 - 5.11 MIL/uL   Hemoglobin 10.1 (L) 12.0 - 15.0 g/dL   HCT 33.9 (L) 36.0 - 46.0 %   MCV 79.0 78.0 - 100.0 fL   MCH 23.5 (L) 26.0 - 34.0 pg   MCHC 29.8 (L) 30.0 - 36.0 g/dL   RDW 15.7 (H) 11.5 - 15.5 %   Platelets 278 150 - 400 K/uL  Urinalysis, Routine w reflex microscopic     Status: Abnormal  Collection Time: 09/21/16  2:19 PM  Result Value Ref Range   Color, Urine YELLOW YELLOW   APPearance TURBID (A) CLEAR   Specific Gravity, Urine >1.046 (H) 1.005 - 1.030   pH 7.0 5.0 - 8.0   Glucose, UA NEGATIVE NEGATIVE mg/dL   Hgb urine dipstick MODERATE (A) NEGATIVE   Bilirubin Urine NEGATIVE NEGATIVE   Ketones, ur NEGATIVE NEGATIVE mg/dL   Protein, ur 100 (A) NEGATIVE mg/dL   Nitrite NEGATIVE NEGATIVE   Leukocytes, UA LARGE (A) NEGATIVE   RBC / HPF TOO NUMEROUS TO COUNT 0 - 5 RBC/hpf   WBC, UA TOO NUMEROUS TO COUNT 0 - 5 WBC/hpf   Bacteria, UA MANY (A) NONE SEEN   Squamous Epithelial / LPF NONE SEEN NONE SEEN   WBC Clumps PRESENT    Mucous PRESENT    Non Squamous Epithelial 6-30 (A) NONE SEEN  Magnesium     Status: None   Collection Time: 09/21/16  5:13 PM  Result Value Ref Range   Magnesium 2.1 1.7 - 2.4 mg/dL  Vitamin B12     Status: None   Collection Time: 09/21/16  5:13 PM  Result Value Ref Range   Vitamin B-12 786 180 - 914 pg/mL    Comment: (NOTE) This assay is not validated  for testing neonatal or myeloproliferative syndrome specimens for Vitamin B12 levels. Performed at [facility]   Folate     Status: None   Collection Time: 09/21/16  5:13 PM  Result Value Ref Range   Folate 20.7 >5.9 ng/mL    Comment: Performed at [facility]  Iron and TIBC     Status: Abnormal   Collection Time: 09/21/16  5:13 PM  Result Value Ref Range   Iron 6 (L) 28 - 170 ug/dL   TIBC 130 (L) 250 - 450 ug/dL   Saturation Ratios 5 (L) 10.4 - 31.8 %   UIBC 124 ug/dL    Comment: Performed at [facility]  Ferritin     Status: None   Collection Time: 09/21/16  5:13 PM  Result Value Ref Range   Ferritin 128 11 - 307 ng/mL    Comment: Performed at [facility]  Comprehensive metabolic panel     Status: Abnormal   Collection Time: 09/22/16  5:14 AM  Result Value Ref Range   Sodium 135 135 - 145 mmol/L   Potassium 3.7 3.5 - 5.1 mmol/L   Chloride 103 101 - 111 mmol/L   CO2 24 22 - 32 mmol/L   Glucose, Bld 106 (H) 65 - 99 mg/dL   BUN 11 6 - 20 mg/dL   Creatinine, Ser 0.52 0.44 - 1.00 mg/dL   Calcium 7.3 (L) 8.9 - 10.3 mg/dL   Total Protein 4.9 (L) 6.5 - 8.1 g/dL   Albumin 1.6 (L) 3.5 - 5.0 g/dL   AST 25 15 - 41 U/L   ALT 11 (L) 14 - 54 U/L   Alkaline Phosphatase 153 (H) 38 - 126 U/L   Total Bilirubin 0.6 0.3 - 1.2 mg/dL   GFR calc non Af Amer >60 >60 mL/min   GFR calc Af Amer >60 >60 mL/min    Comment: (NOTE) The eGFR has been calculated using the CKD EPI equation. This calculation has not been validated in all clinical situations. eGFR's persistently <60 mL/min signify possible Chronic Kidney Disease.    Anion gap 8 5 - 15  CBC     Status: Abnormal   Collection Time: 09/22/16  5:14 AM  Result Value Ref Range   WBC 5.8 4.0 - 10.5 K/uL   RBC 3.69 (L) 3.87 - 5.11 MIL/uL   Hemoglobin 9.1 (L) 12.0 - 15.0 g/dL   HCT 29.8 (L) 36.0 - 46.0 %   MCV 80.8 78.0 - 100.0 fL   MCH 24.7 (L) 26.0 - 34.0 pg   MCHC 30.5 30.0 - 36.0 g/dL   RDW 15.8 (H)  11.5 - 15.5 %   Platelets 285 150 - 400 K/uL  Protime-INR     Status: Abnormal   Collection Time: 09/22/16  5:14 AM  Result Value Ref Range   Prothrombin Time 15.3 (H) 11.4 - 15.2 seconds   INR 1.20   APTT     Status: None   Collection Time: 09/22/16  5:14 AM  Result Value Ref Range   aPTT 33 24 - 36 seconds    Ct Abdomen Pelvis W Contrast  Result Date: 09/21/2016 CLINICAL DATA:  Lower abdominal pain, vomiting. EXAM: CT ABDOMEN AND PELVIS WITH CONTRAST TECHNIQUE: Multidetector CT imaging of the abdomen and pelvis was performed using the standard protocol following bolus administration of intravenous contrast. CONTRAST:  185m ISOVUE-300 IOPAMIDOL (ISOVUE-300) INJECTION 61% COMPARISON:  11/11/2010 FINDINGS: Lower chest: Mild cardiomegaly. Dense mitral valve annular calcifications. Dependent atelectasis in the lung bases. Trace bilateral pleural effusions. Hepatobiliary: Prior cholecystectomy. Common bile duct and intrahepatic ducts are slightly prominent, likely related to post cholecystectomy state. Common bile duct 13 mm in the pancreatic head. Pancreas: Diffuse pancreatic atrophy. No duct dilatation or focal abnormality. Dense calcifications throughout the splenic vein in the region of the pancreas. Spleen: Small low-density areas in the spleen are stable since prior study, likely small cysts. Adrenals/Urinary Tract: 3.5 cm cyst in the midpole of the right kidney, slightly larger than prior study. No stones or hydronephrosis. Adrenal glands unremarkable. Gas noted within the urinary bladder, presumably from recent catheterization. Stomach/Bowel: Sigmoid diverticulosis. There is a large mass like heterogeneous area in the sigmoid colon with surrounding inflammatory change. This is concerning for large colonic mass/ tumor. This is difficult to measure due to irregular margins and diffuse nature of the process. Heterogeneous enhancing area on image 71 measures 10.1 x 5.9 cm. This masslike area abuts the  superior aspect of the urinary bladder. Large stool burden noted throughout the remainder of the colon. Stomach and small bowel are decompressed. Vascular/Lymphatic: Aorta is tortuous as are the iliac vessels. No aneurysm. No adenopathy. Reproductive: Uterus and adnexa unremarkable. Calcifications throughout the uterine wall. Other: Small amount of free fluid in the pelvis.  No free air. Musculoskeletal: No acute bony abnormality. Degenerative disc and facet disease throughout the lumbar spine. IMPRESSION: Large heterogeneous enhancing mass like area involving the sigmoid colon. While there are extensive diverticular of within the sigmoid colon, I feel this is most likely a colon mass/ tumor. This abuts the bladder. There is gas within the urinary bladder which is presumably from catheterization. If the patient has not been catheterized, this would be concerning for fistula to the adjacent abnormal sigmoid colon. Small stable cystic areas within the spleen. Prior cholecystectomy. Dilated common bile duct and intrahepatic ducts likely related to post cholecystectomy state. Aorta iliac atherosclerosis. Dependent atelectasis in the lung bases. Trace bilateral pleural effusions. Electronically Signed   By: KRolm BaptiseM.D.   On: 09/21/2016 12:41    ROS Blood pressure 135/77, pulse 98, temperature 97.6 F (36.4 C), temperature source Oral, resp. rate (!) 22, height 5' 1"  (1.549 m), weight 69.4  kg (153 lb), SpO2 100 %. Physical Exam vital see chart no acute distress exam please see preassessment evaluation CT and labs reviewed  Assessment/Plan: Sigmoid mass in patient with multiple medical problems Plan: The risks benefits methods of flexible sigmoidoscopy and possible colonoscopy was discussed with the patient and her daughter and will proceed later today with further workup and plans pending those findings  Elianis Fischbach E 09/22/2016, 11:44 AM

## 2016-09-22 NOTE — Anesthesia Preprocedure Evaluation (Signed)
Anesthesia Evaluation  Patient identified by MRN, date of birth, ID band Patient awake    Reviewed: Allergy & Precautions, NPO status , Patient's Chart, lab work & pertinent test results  Airway Mallampati: I  TM Distance: >3 FB Neck ROM: Full    Dental  (+) Edentulous Upper, Edentulous Lower   Pulmonary neg pulmonary ROS, pneumonia, resolved,    Pulmonary exam normal breath sounds clear to auscultation       Cardiovascular hypertension, Pt. on medications Normal cardiovascular exam Rhythm:Regular Rate:Normal     Neuro/Psych negative neurological ROS  negative psych ROS   GI/Hepatic Neg liver ROS, GERD  Medicated and Controlled,Colon mass on CT   Endo/Other    Renal/GU negative Renal ROS  negative genitourinary   Musculoskeletal negative musculoskeletal ROS (+)   Abdominal   Peds  Hematology  (+) anemia , On Lovenox   Anesthesia Other Findings   Reproductive/Obstetrics                             Anesthesia Physical Anesthesia Plan  ASA: II  Anesthesia Plan: MAC   Post-op Pain Management:    Induction: Intravenous  Airway Management Planned: Nasal Cannula, Natural Airway and Simple Face Mask  Additional Equipment:   Intra-op Plan:   Post-operative Plan:   Informed Consent: I have reviewed the patients History and Physical, chart, labs and discussed the procedure including the risks, benefits and alternatives for the proposed anesthesia with the patient or authorized representative who has indicated his/her understanding and acceptance.     Plan Discussed with: Anesthesiologist, CRNA and Surgeon  Anesthesia Plan Comments:         Anesthesia Quick Evaluation

## 2016-09-22 NOTE — Addendum Note (Signed)
Addendum  created 09/22/16 1410 by Suan Halter, CRNA   Charge Capture section accepted

## 2016-09-22 NOTE — Op Note (Signed)
[facility] Patient Name: Emily Mills Procedure Date: 09/22/2016 MRN: [id number] Attending MD: Clarene Essex , MD Date of Birth: May 14, 1931 CSN: [id number] Age: 83 Admit Type: Inpatient Procedure:                Flexible Sigmoidoscopy Indications:              Abnormal CT of the GI tract Providers:                Clarene Essex, MD, Laverta Baltimore RN, RN, Janie                            Billups, Technician, [facility], CRNA Referring MD:              Medicines:                Propofol total dose 150 mg IV80 mg IV lidocaine Complications:            No immediate complications. Estimated Blood Loss:     Estimated blood loss: none. Procedure:                Pre-Anesthesia Assessment:                           - Prior to the procedure, a History and Physical                            was performed, and patient medications and                            allergies were reviewed. The patient's tolerance of                            previous anesthesia was also reviewed. The risks                            and benefits of the procedure and the sedation                            options and risks were discussed with the patient.                            All questions were answered, and informed consent                            was obtained. Prior Anticoagulants: The patient has                            taken no previous anticoagulant or antiplatelet                            agents. ASA Grade Assessment: II - A patient with                            mild systemic disease. After reviewing the risks  and benefits, the patient was deemed in                            satisfactory condition to undergo the procedure.                           After obtaining informed consent, the scope was                            passed under direct vision. The [id number]                            scope was introduced through the anus and  advanced                            to the the sigmoid colon. The flexible                            sigmoidoscopy was technically difficult and complex                            due to a partially obstructing mass. The patient                            tolerated the procedure well. The quality of the                            bowel preparation was adequate. Scope In: Scope Out: Findings:      External and internal hemorrhoids were found during retroflexion, during       perianal exam and during digital exam. The hemorrhoids were small.      Scattered small-mouthed diverticula were found in the sigmoid colon and       distal sigmoid colon.      An ulcerated completely obstructing large mass was found in the mid       sigmoid colon. The mass was circumferential. No bleeding was present.       This was biopsied [Device] [Purpose]. Biopsies were taken with a cold       forceps for histology.      The exam was otherwise without abnormality. Impression:               - External and internal hemorrhoids.                           - Diverticulosis in the sigmoid colon and in the                            distal sigmoid colon.                           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 Biopsied.                           -  The examination was otherwise normal. Moderate Sedation:      N/A- Per Anesthesia Care Recommendation:           - Clear liquid diet today.                           - Continue present medications.                           - Await pathology results.                           - Return to GI clinic PRN. add CEA to AM labs                           - Telephone GI clinic if symptomatic PRN.                           - Telephone GI clinic for pathology results in 3                            days.                           - Refer to a surgeon today. Procedure Code(s):        --- Professional ---                            [phone], Sigmoidoscopy, flexible; with biopsy, single                            or multiple Diagnosis Code(s):        --- Professional ---                           K64.8, Other hemorrhoids                           D49.0, Neoplasm of unspecified behavior of                            digestive system                           K57.30, Diverticulosis of large intestine without                            perforation or abscess without bleeding                           R93.3, Abnormal findings on diagnostic imaging of                            other parts of digestive tract CPT copyright 2016 [employer]. All rights reserved. The codes documented in this report are preliminary and upon coder review may  be revised to meet current compliance requirements. Clarene Essex, MD 09/22/2016 1:14:17 PM This report  has been signed electronically. Number of Addenda: 0

## 2016-09-22 NOTE — Anesthesia Procedure Notes (Signed)
Performed by: Suan Halter

## 2016-09-22 NOTE — Transfer of Care (Signed)
Immediate Anesthesia Transfer of Care Note  Patient: Emily Mills  Procedure(s) Performed: Procedure(s) (LRB): FLEXIBLE SIGMOIDOSCOPY (N/A)  Patient Location: PACU  Anesthesia Type: MAC  Level of Consciousness: awake, alert , oriented and patient cooperative  Airway & Oxygen Therapy: Patient Spontanous Breathing and Patient connected to face mask oxygen  Post-op Assessment: Report given to PACU RN and Post -op Vital signs reviewed and stable  Post vital signs: Reviewed and stable  Complications: No apparent anesthesia complications

## 2016-09-22 NOTE — Progress Notes (Signed)
Made MD aware that pt is refusing bowel prep. Patient drank half and had one BM. Order received to make GI aware at [place].

## 2016-09-22 NOTE — Consult Note (Signed)
Reason for Consult:Obstructing sigmoid mass Referring Physician: Dr. Clarene Essex  Emily Mills is an 83 y.o. female.  HPI: Pt with recent hospitalization noted below.  She is back with ongoing diarrhea, and scheduled for flexible sigmoidoscopy or colonoscopy by Dr. Watt Climes today. She has deferred this in the past. Work up shows she is afebrile, VSS.   Mild anemia, and low albumin/protein.  CT with contrast shows:  There is a large mass like heterogeneous area in the sigmoid colon with surrounding inflammatory change. This is concerning for large colonic mass/ tumor. This is difficult to measure due to irregular margins and diffuse nature of the process. Heterogeneous enhancing area on image 71 measures 10.1 x 5.9 cm. This masslike area abuts the superior aspect of the urinary bladder. Large stool burden noted throughout the remainder of the colon. Stomach and small bowel are decompressed. Dependent atelectasis in the lung bases. Trace bilateral pleural effusions. Flexible sigmoidoscopy done today shows:  An ulcerated completely obstructing large mass was found in the mid sigmoid colon. The mass was circumferential. No bleeding was present. This was biopsied [Device] [Purpose]. Biopsies were taken with a cold forceps for histology.  Hospitalized for repeated falls 08/21/16, sepsis, with LUL pneumonia and UTI.  She had multiple medical issues including weight loss, frequent fall, and lower extremity weakness.  MRI with some neural foraminal narrowing L3-4, & L5-S1.  She was walking with walker at that time/  Discharged from [facility] on 09/[facility]/16. Pt came back to ED on 09/21/16 with diarrhea since 06/2016 negative for C diff.  We are ask to see about the obstructing sigmoid mass.  Past Medical History:  Diagnosis Date  . Dehydration, mild 08/2016  . FTT (failure to thrive) in adult 08/2016  . Hypertension   . Hyponatremia 08/2016  . Lobar pneumonia, unspecified organism ([place]) 08/2016  . Pre-diabetes   . SIRS  (systemic inflammatory response syndrome) ([place]) 08/2016  . Skin cancer   . UTI (urinary tract infection) 08/2016    Past Surgical History:  Procedure Laterality Date  . CHOLECYSTECTOMY     30 years ago    Family History  Problem Relation Age of Onset  . CVA Mother   . Dementia Father     Social History:  reports that she has never smoked. She has never used smokeless tobacco. She reports that she does not drink alcohol or use drugs.  Allergies: No Known Allergies  Medications:  Prior to Admission:  Prescriptions Prior to Admission  Medication Sig Dispense Refill Last Dose  . acetaminophen (TYLENOL) 325 MG tablet Take 650 mg by mouth every 4 (four) hours as needed for mild pain.   09/20/2016 at Unknown time  . amLODipine (NORVASC) 2.5 MG tablet Take 2.5 mg by mouth daily.    09/20/2016 at Unknown time  . famotidine (PEPCID) 20 MG tablet Take 1 tablet (20 mg total) by mouth daily.   09/20/2016 at Unknown time  . fluticasone (FLONASE) 50 MCG/ACT nasal spray Place into both nostrils daily.   09/20/2016 at Unknown time  . Lactobacillus (ACIDOPHILUS PO) Take 1 capsule by mouth daily. Control UTI   09/20/2016 at Unknown time  . loperamide (IMODIUM) 2 MG capsule Take 2 mg by mouth 4 (four) times daily as needed for diarrhea or loose stools.   09/20/2016 at Unknown time  . menthol-zinc oxide (GOLD BOND) powder Apply 1 application topically daily.   09/20/2016 at Unknown time  . Multiple Vitamins-Minerals (DAILY MULTI 50+ PO) Take 1 tablet by mouth  daily.   09/20/2016 at Unknown time  . nystatin cream (MYCOSTATIN) Apply 1 application topically daily as needed for irritation or rash.   09/20/2016 at Unknown time  . simethicone (MYLICON) 102 MG chewable tablet Chew 125 mg by mouth every 6 (six) hours as needed for flatulence.   09/20/2016 at Unknown time  . Elastic Bandages & Supports (T.E.D. BELOW KNEE/M-REGULAR) MISC On in AM and off in PM. Medical supply store to measure and give appropriate size. Moderate  compression. 3 each 0 Taking  . guaiFENesin (MUCINEX) 600 MG 12 hr tablet Take 1 tablet (600 mg total) by mouth 2 (two) times daily as needed. (Patient not taking: Reported on 09/21/2016)   Not Taking at Unknown time  . senna-docusate (SENOKOT-S) 8.6-50 MG tablet Take 1 tablet by mouth at bedtime as needed for mild constipation. (Patient not taking: Reported on 09/21/2016)   Not Taking at Unknown time   Scheduled: . cefTRIAXone (ROCEPHIN)  IV  1 g Intravenous Q24H  . enoxaparin (LOVENOX) injection  40 mg Subcutaneous Q24H  . fluconazole (DIFLUCAN) IV  100 mg Intravenous Q24H  . fluticasone  1 spray Each Nare Daily  . nystatin  5 mL Oral QID  . nystatin cream  1 application Topical BID  . senna  1 tablet Oral BID   Continuous: . sodium chloride 100 mL/hr at 09/22/16 0600   HEN:IDPOEUMPNTIRW **OR** acetaminophen, ketorolac, morphine injection, ondansetron **OR** ondansetron (ZOFRAN) IV, simethicone, sodium phosphate, sorbitol, traMADol Anti-infectives    Start     Dose/Rate Route Frequency Ordered Stop   09/22/16 1700  fluconazole (DIFLUCAN) IVPB 100 mg     100 mg 50 mL/hr over 60 Minutes Intravenous Every 24 hours 09/21/16 1611     09/21/16 1700  fluconazole (DIFLUCAN) IVPB 200 mg     200 mg 100 mL/hr over 60 Minutes Intravenous  Once 09/21/16 1608 09/21/16 2030   09/21/16 1700  cefTRIAXone (ROCEPHIN) 1 g in dextrose 5 % 50 mL IVPB     1 g 100 mL/hr over 30 Minutes Intravenous Every 24 hours 09/21/16 1608        Results for orders placed or performed during the hospital encounter of 09/21/16 (from the past 48 hour(s))  Lipase, blood     Status: None   Collection Time: 09/21/16  9:05 AM  Result Value Ref Range   Lipase 12 11 - 51 U/L  Comprehensive metabolic panel     Status: Abnormal   Collection Time: 09/21/16  9:05 AM  Result Value Ref Range   Sodium 136 135 - 145 mmol/L   Potassium 4.0 3.5 - 5.1 mmol/L   Chloride 99 (L) 101 - 111 mmol/L   CO2 28 22 - 32 mmol/L   Glucose, Bld  124 (H) 65 - 99 mg/dL   BUN 13 6 - 20 mg/dL   Creatinine, Ser 0.35 (L) 0.44 - 1.00 mg/dL   Calcium 7.9 (L) 8.9 - 10.3 mg/dL   Total Protein 6.0 (L) 6.5 - 8.1 g/dL   Albumin 1.9 (L) 3.5 - 5.0 g/dL   AST 28 15 - 41 U/L   ALT 13 (L) 14 - 54 U/L   Alkaline Phosphatase 159 (H) 38 - 126 U/L   Total Bilirubin 0.4 0.3 - 1.2 mg/dL   GFR calc non Af Amer >60 >60 mL/min   GFR calc Af Amer >60 >60 mL/min    Comment: (NOTE) The eGFR has been calculated using the CKD EPI equation. This calculation has not been validated  in all clinical situations. eGFR's persistently <60 mL/min signify possible Chronic Kidney Disease.    Anion gap 9 5 - 15  CBC     Status: Abnormal   Collection Time: 09/21/16  9:05 AM  Result Value Ref Range   WBC 7.0 4.0 - 10.5 K/uL   RBC 4.29 3.87 - 5.11 MIL/uL   Hemoglobin 10.1 (L) 12.0 - 15.0 g/dL   HCT 33.9 (L) 36.0 - 46.0 %   MCV 79.0 78.0 - 100.0 fL   MCH 23.5 (L) 26.0 - 34.0 pg   MCHC 29.8 (L) 30.0 - 36.0 g/dL   RDW 15.7 (H) 11.5 - 15.5 %   Platelets 278 150 - 400 K/uL  Urinalysis, Routine w reflex microscopic     Status: Abnormal   Collection Time: 09/21/16  2:19 PM  Result Value Ref Range   Color, Urine YELLOW YELLOW   APPearance TURBID (A) CLEAR   Specific Gravity, Urine >1.046 (H) 1.005 - 1.030   pH 7.0 5.0 - 8.0   Glucose, UA NEGATIVE NEGATIVE mg/dL   Hgb urine dipstick MODERATE (A) NEGATIVE   Bilirubin Urine NEGATIVE NEGATIVE   Ketones, ur NEGATIVE NEGATIVE mg/dL   Protein, ur 100 (A) NEGATIVE mg/dL   Nitrite NEGATIVE NEGATIVE   Leukocytes, UA LARGE (A) NEGATIVE   RBC / HPF TOO NUMEROUS TO COUNT 0 - 5 RBC/hpf   WBC, UA TOO NUMEROUS TO COUNT 0 - 5 WBC/hpf   Bacteria, UA MANY (A) NONE SEEN   Squamous Epithelial / LPF NONE SEEN NONE SEEN   WBC Clumps PRESENT    Mucous PRESENT    Non Squamous Epithelial 6-30 (A) NONE SEEN  Magnesium     Status: None   Collection Time: 09/21/16  5:13 PM  Result Value Ref Range   Magnesium 2.1 1.7 - 2.4 mg/dL   Vitamin B12     Status: None   Collection Time: 09/21/16  5:13 PM  Result Value Ref Range   Vitamin B-12 786 180 - 914 pg/mL    Comment: (NOTE) This assay is not validated for testing neonatal or myeloproliferative syndrome specimens for Vitamin B12 levels. Performed at [facility]   Folate     Status: None   Collection Time: 09/21/16  5:13 PM  Result Value Ref Range   Folate 20.7 >5.9 ng/mL    Comment: Performed at [facility]  Iron and TIBC     Status: Abnormal   Collection Time: 09/21/16  5:13 PM  Result Value Ref Range   Iron 6 (L) 28 - 170 ug/dL   TIBC 130 (L) 250 - 450 ug/dL   Saturation Ratios 5 (L) 10.4 - 31.8 %   UIBC 124 ug/dL    Comment: Performed at [facility]  Ferritin     Status: None   Collection Time: 09/21/16  5:13 PM  Result Value Ref Range   Ferritin 128 11 - 307 ng/mL    Comment: Performed at [facility]  Comprehensive metabolic panel     Status: Abnormal   Collection Time: 09/22/16  5:14 AM  Result Value Ref Range   Sodium 135 135 - 145 mmol/L   Potassium 3.7 3.5 - 5.1 mmol/L   Chloride 103 101 - 111 mmol/L   CO2 24 22 - 32 mmol/L   Glucose, Bld 106 (H) 65 - 99 mg/dL   BUN 11 6 - 20 mg/dL   Creatinine, Ser 0.52 0.44 - 1.00 mg/dL   Calcium 7.3 (L) 8.9 -  10.3 mg/dL   Total Protein 4.9 (L) 6.5 - 8.1 g/dL   Albumin 1.6 (L) 3.5 - 5.0 g/dL   AST 25 15 - 41 U/L   ALT 11 (L) 14 - 54 U/L   Alkaline Phosphatase 153 (H) 38 - 126 U/L   Total Bilirubin 0.6 0.3 - 1.2 mg/dL   GFR calc non Af Amer >60 >60 mL/min   GFR calc Af Amer >60 >60 mL/min    Comment: (NOTE) The eGFR has been calculated using the CKD EPI equation. This calculation has not been validated in all clinical situations. eGFR's persistently <60 mL/min signify possible Chronic Kidney Disease.    Anion gap 8 5 - 15  CBC     Status: Abnormal   Collection Time: 09/22/16  5:14 AM  Result Value Ref Range   WBC 5.8 4.0 - 10.5 K/uL   RBC 3.69 (L) 3.87 - 5.11  MIL/uL   Hemoglobin 9.1 (L) 12.0 - 15.0 g/dL   HCT 29.8 (L) 36.0 - 46.0 %   MCV 80.8 78.0 - 100.0 fL   MCH 24.7 (L) 26.0 - 34.0 pg   MCHC 30.5 30.0 - 36.0 g/dL   RDW 15.8 (H) 11.5 - 15.5 %   Platelets 285 150 - 400 K/uL  Protime-INR     Status: Abnormal   Collection Time: 09/22/16  5:14 AM  Result Value Ref Range   Prothrombin Time 15.3 (H) 11.4 - 15.2 seconds   INR 1.20   APTT     Status: None   Collection Time: 09/22/16  5:14 AM  Result Value Ref Range   aPTT 33 24 - 36 seconds    Ct Abdomen Pelvis W Contrast  Result Date: 09/21/2016 CLINICAL DATA:  Lower abdominal pain, vomiting. EXAM: CT ABDOMEN AND PELVIS WITH CONTRAST TECHNIQUE: Multidetector CT imaging of the abdomen and pelvis was performed using the standard protocol following bolus administration of intravenous contrast. CONTRAST:  175m ISOVUE-300 IOPAMIDOL (ISOVUE-300) INJECTION 61% COMPARISON:  11/11/2010 FINDINGS: Lower chest: Mild cardiomegaly. Dense mitral valve annular calcifications. Dependent atelectasis in the lung bases. Trace bilateral pleural effusions. Hepatobiliary: Prior cholecystectomy. Common bile duct and intrahepatic ducts are slightly prominent, likely related to post cholecystectomy state. Common bile duct 13 mm in the pancreatic head. Pancreas: Diffuse pancreatic atrophy. No duct dilatation or focal abnormality. Dense calcifications throughout the splenic vein in the region of the pancreas. Spleen: Small low-density areas in the spleen are stable since prior study, likely small cysts. Adrenals/Urinary Tract: 3.5 cm cyst in the midpole of the right kidney, slightly larger than prior study. No stones or hydronephrosis. Adrenal glands unremarkable. Gas noted within the urinary bladder, presumably from recent catheterization. Stomach/Bowel: Sigmoid diverticulosis. There is a large mass like heterogeneous area in the sigmoid colon with surrounding inflammatory change. This is concerning for large colonic mass/ tumor.  This is difficult to measure due to irregular margins and diffuse nature of the process. Heterogeneous enhancing area on image 71 measures 10.1 x 5.9 cm. This masslike area abuts the superior aspect of the urinary bladder. Large stool burden noted throughout the remainder of the colon. Stomach and small bowel are decompressed. Vascular/Lymphatic: Aorta is tortuous as are the iliac vessels. No aneurysm. No adenopathy. Reproductive: Uterus and adnexa unremarkable. Calcifications throughout the uterine wall. Other: Small amount of free fluid in the pelvis.  No free air. Musculoskeletal: No acute bony abnormality. Degenerative disc and facet disease throughout the lumbar spine. IMPRESSION: Large heterogeneous enhancing mass like area involving the sigmoid  colon. While there are extensive diverticular of within the sigmoid colon, I feel this is most likely a colon mass/ tumor. This abuts the bladder. There is gas within the urinary bladder which is presumably from catheterization. If the patient has not been catheterized, this would be concerning for fistula to the adjacent abnormal sigmoid colon. Small stable cystic areas within the spleen. Prior cholecystectomy. Dilated common bile duct and intrahepatic ducts likely related to post cholecystectomy state. Aorta iliac atherosclerosis. Dependent atelectasis in the lung bases. Trace bilateral pleural effusions. Electronically Signed   By: Rolm Baptise M.D.   On: 09/21/2016 12:41    Review of Systems  Constitutional: Positive for malaise/fatigue. Negative for chills and fever.  HENT: Negative.   Eyes: Negative.   Respiratory: Negative.   Cardiovascular: Negative.   Gastrointestinal: Positive for abdominal pain (stomach hurts now and thinks it gas post sigmoidoscopy) and diarrhea. Negative for heartburn, nausea and vomiting.       Incontinent of stool right now  Genitourinary: Positive for frequency and urgency.  Musculoskeletal: Positive for myalgias.  Skin:  Negative.   Neurological: Positive for weakness.  Endo/Heme/Allergies: Negative.   Psychiatric/Behavioral: Negative.    Blood pressure (!) 149/75, pulse 94, temperature 98.1 F (36.7 C), temperature source Oral, resp. rate 17, height 5' 1"  (1.549 m), weight 69.4 kg (153 lb), SpO2 97 %. Physical Exam  Constitutional: She is oriented to person, place, and time.  Elderly frail WF, s/p sigmoidoscopy, incontinent and having some abdominal pain.  HENT:  Head: Normocephalic and atraumatic.  Mouth/Throat: No oropharyngeal exudate.  Eyes: Right eye exhibits no discharge. Left eye exhibits no discharge. No scleral icterus.  Neck: Normal range of motion. No JVD present. No tracheal deviation present. No thyromegaly present.  Cardiovascular: Normal rate, regular rhythm, normal heart sounds and intact distal pulses.   Respiratory: Effort normal and breath sounds normal. No respiratory distress. She has no wheezes. She has no rales. She exhibits no tenderness.  GI: Soft. She exhibits distension. She exhibits no mass. There is no tenderness. There is no rebound and no guarding.  Musculoskeletal: She exhibits edema (trace). She exhibits no tenderness.  Lymphadenopathy:    She has no cervical adenopathy.  Neurological: She is alert and oriented to person, place, and time. No cranial nerve deficit.  She still seems a bit confused, but post Sigmoidoscopy  Skin: Skin is warm and dry. No rash noted. No erythema. No pallor.  Psychiatric: She has a normal mood and affect. Her behavior is normal. Judgment and thought content normal.  As noted above, post anesthesia for procedure.    Assessment/Plan: Obstructing Sigmoid mass - pathology pending S/p flexible sigmoidoscopy Diarrhea Deconditioning and Malnutrition  Mild anemia UTI/Pneumonia 08/21/16 discharged from [facility] 09/08/16 Hx of hypertension Recurrent UTI's - culture pending now - on ceftriaxone/Diflucan DNR Prediabetes   Plan:  She will need the  obstruction removed.  We will need to talk to the family and she will need Medical clearance for the surgery.  Dr. Marcello Moores will review and see her today.     Katharina Jehle 09/22/2016, 1:36 PM

## 2016-09-22 NOTE — Progress Notes (Addendum)
PROGRESS NOTE                                                                                                                                                                                                             Patient Demographics:    Emily Mills, is a 83 y.o. female, DOB - [birth year], [id number]  Admit date - 09/21/2016   Admitting Physician Eugenie Filler, MD  Outpatient Primary MD for the patient is London Pepper, MD  LOS - 0  Outpatient Specialists: [facility]  Chief Complaint  Patient presents with  . Diarrhea       Brief Narrative   83 year old female with hypertension, melanoma status post resection: GERD presented to the ED with worsening diarrhea, generalized weakness, lightheadedness and almost 40 pound weight loss for the past 6 months. Patient was admitted to the hospital in October for worsening symptoms when she was diagnosed with pneumonia and UTI and discharged to rehabilitation. She was then discharged to an assisted-living. GI saw her on 12/28 for ongoing diarrhea, performed an x-ray which was concerning for bowel obstruction. Colonoscopy was recommended but patient refused. Given significant worsening diarrhea and progressive weakness she was brought to the ED. Patient also complaining of a 4 quadrant abdominal pain, dysuria, increased urinary frequency.   In the ED Vitals were stable. Chemistry showed alkaline phosphatase of 159, low albumin, who underwent of 10. UA was positive for UTI. CT abdomen and pelvis showed large heterogenous enhancing mass likely involving the sigmoid colon. Patient placed on observation .   Subjective:   Patient appears confused. Denies any symptoms.   Assessment  & Plan :    Principal Problem: Colonic mass with obstruction Complete obstructing tumor in the mid sigmoid colon on colonoscopy, possibly malignant. Biopsy sent. Continue clear  liquid.  preop clearance Surgery consulted for ? Diverting colostomy. Given her Age and recent FTT pt is definitely at moderate cardiac risk for surgery. Patient does not have significant cardiac history. recnet CXR was unremarkable. Check EKG. Not on BB at home. Does not need perioperative beta blocker.  if EKG is normal, wound not pursue further preoperative cardiac or pulmonary assessment.    Active Problems:    UTI (urinary tract infection) Follow culture. Continue empiric Rocephin.    FTT (failure to thrive) in adult Gentle hydration. PT evaluation. Daughter  concerned patient may need skilled level of care.  Oral thrush On IV Diflucan.  Anemia Possibly associated with malignancy. Also has hemorrhoids on colonoscopy. Severe iron deficiency noted.   Diarrhea Secondary to obstructing colon mass. On bowel regimen and hydration.     Code Status : DNR  Family Communication  : daughter at bedside  Disposition Plan  : May need SNF  Barriers For Discharge :pending biopsy result and PT eval  Consults  :  [facility]  Procedures  : Colonoscopy with biopsy  DVT Prophylaxis  :  SCDs  Lab Results  Component Value Date   PLT 285 09/22/2016    Antibiotics  :    Anti-infectives    Start     Dose/Rate Route Frequency Ordered Stop   09/22/16 1700  fluconazole (DIFLUCAN) IVPB 100 mg     100 mg 50 mL/hr over 60 Minutes Intravenous Every 24 hours 09/21/16 1611     09/21/16 1700  fluconazole (DIFLUCAN) IVPB 200 mg     200 mg 100 mL/hr over 60 Minutes Intravenous  Once 09/21/16 1608 09/21/16 2030   09/21/16 1700  cefTRIAXone (ROCEPHIN) 1 g in dextrose 5 % 50 mL IVPB     1 g 100 mL/hr over 30 Minutes Intravenous Every 24 hours 09/21/16 1608          Objective:   Vitals:   09/22/16 1136 09/22/16 1313 09/22/16 1320 09/22/16 1330  BP: 135/77 118/68 123/71 (!) 149/75  Pulse: 98 95 93 94  Resp: (!) 22 (!) 35 (!) 23 17  Temp: 97.6 F (36.4 C) 98.1 F (36.7 C)    TempSrc:  Oral Oral    SpO2: 100% 100% 95% 97%  Weight: 69.4 kg (153 lb)     Height: 5\' 1"  (1.549 m)       Wt Readings from Last 3 Encounters:  09/22/16 69.4 kg (153 lb)  09/08/16 69.4 kg (153 lb)  08/24/16 69.4 kg (153 lb)     Intake/Output Summary (Last 24 hours) at 09/22/16 1405 Last data filed at 09/22/16 1304  Gross per 24 hour  Intake          1911.67 ml  Output                0 ml  Net          1911.67 ml     Physical Exam  Gen: not in distress, confused HEENT:  pallor+, moist mucosa, supple neck Chest: clear b/l, no added sounds CVS: N S1&S2, no murmurs, GI: soft, NT, ND, BS+ Musculoskeletal: warm, no edema CNS: AAOX2, non focal    Data Review:    CBC  Recent Labs Lab 09/21/16 0905 09/22/16 0514  WBC 7.0 5.8  HGB 10.1* 9.1*  HCT 33.9* 29.8*  PLT 278 285  MCV 79.0 80.8  MCH 23.5* 24.7*  MCHC 29.8* 30.5  RDW 15.7* 15.8*    Chemistries   Recent Labs Lab 09/21/16 0905 09/21/16 1713 09/22/16 0514  NA 136  --  135  K 4.0  --  3.7  CL 99*  --  103  CO2 28  --  24  GLUCOSE 124*  --  106*  BUN 13  --  11  CREATININE 0.35*  --  0.52  CALCIUM 7.9*  --  7.3*  MG  --  2.1  --   AST 28  --  25  ALT 13*  --  11*  ALKPHOS 159*  --  153*  BILITOT  0.4  --  0.6   ------------------------------------------------------------------------------------------------------------------ No results for input(s): CHOL, HDL, LDLCALC, TRIG, CHOLHDL, LDLDIRECT in the last 72 hours.  Lab Results  Component Value Date   HGBA1C 6.1 (H) 08/20/2016   ------------------------------------------------------------------------------------------------------------------ No results for input(s): TSH, T4TOTAL, T3FREE, THYROIDAB in the last 72 hours.  Invalid input(s): FREET3 ------------------------------------------------------------------------------------------------------------------  Recent Labs  09/21/16 1713  VITAMINB12 786  FOLATE 20.7  FERRITIN 128  TIBC 130*  IRON  6*    Coagulation profile  Recent Labs Lab 09/22/16 0514  INR 1.20    No results for input(s): DDIMER in the last 72 hours.  Cardiac Enzymes No results for input(s): CKMB, TROPONINI, MYOGLOBIN in the last 168 hours.  Invalid input(s): CK ------------------------------------------------------------------------------------------------------------------ No results found for: BNP  Inpatient Medications  Scheduled Meds: . cefTRIAXone (ROCEPHIN)  IV  1 g Intravenous Q24H  . enoxaparin (LOVENOX) injection  40 mg Subcutaneous Q24H  . fluconazole (DIFLUCAN) IV  100 mg Intravenous Q24H  . fluticasone  1 spray Each Nare Daily  . nystatin  5 mL Oral QID  . nystatin cream  1 application Topical BID  . senna  1 tablet Oral BID   Continuous Infusions: . sodium chloride 100 mL/hr at 09/22/16 0600   PRN Meds:.acetaminophen **OR** acetaminophen, ketorolac, morphine injection, ondansetron **OR** ondansetron (ZOFRAN) IV, simethicone, sodium phosphate, sorbitol, traMADol  Micro Results No results found for this or any previous visit (from the past 240 hour(s)).  Radiology Reports Dg Chest 2 View  Result Date: 09/17/2016 CLINICAL DATA:  Recent pneumonia EXAM: CHEST  2 VIEW COMPARISON:  Chest radiograph August 18, 2016 and chest CT August 18, 2016 FINDINGS: There has been virtually complete clearing of infiltrate from the left upper lobe. Minimal atelectasis remains in this area currently. The lungs elsewhere clear. Heart size and pulmonary vascularity are normal. No adenopathy. There is atherosclerotic calcification aorta. There is also calcification of the mitral annulus. There is degenerative change in the thoracic spine. IMPRESSION: Essentially complete clearing of left upper lobe infiltrate compared to 1 month prior. No new opacity. Stable cardiac silhouette. There is aortic atherosclerosis. Electronically Signed   By: Lowella Grip III M.D.   On: 09/17/2016 12:33   Ct Abdomen  Pelvis W Contrast  Result Date: 09/21/2016 CLINICAL DATA:  Lower abdominal pain, vomiting. EXAM: CT ABDOMEN AND PELVIS WITH CONTRAST TECHNIQUE: Multidetector CT imaging of the abdomen and pelvis was performed using the standard protocol following bolus administration of intravenous contrast. CONTRAST:  180mL ISOVUE-300 IOPAMIDOL (ISOVUE-300) INJECTION 61% COMPARISON:  11/11/2010 FINDINGS: Lower chest: Mild cardiomegaly. Dense mitral valve annular calcifications. Dependent atelectasis in the lung bases. Trace bilateral pleural effusions. Hepatobiliary: Prior cholecystectomy. Common bile duct and intrahepatic ducts are slightly prominent, likely related to post cholecystectomy state. Common bile duct 13 mm in the pancreatic head. Pancreas: Diffuse pancreatic atrophy. No duct dilatation or focal abnormality. Dense calcifications throughout the splenic vein in the region of the pancreas. Spleen: Small low-density areas in the spleen are stable since prior study, likely small cysts. Adrenals/Urinary Tract: 3.5 cm cyst in the midpole of the right kidney, slightly larger than prior study. No stones or hydronephrosis. Adrenal glands unremarkable. Gas noted within the urinary bladder, presumably from recent catheterization. Stomach/Bowel: Sigmoid diverticulosis. There is a large mass like heterogeneous area in the sigmoid colon with surrounding inflammatory change. This is concerning for large colonic mass/ tumor. This is difficult to measure due to irregular margins and diffuse nature of the process. Heterogeneous enhancing area on image  71 measures 10.1 x 5.9 cm. This masslike area abuts the superior aspect of the urinary bladder. Large stool burden noted throughout the remainder of the colon. Stomach and small bowel are decompressed. Vascular/Lymphatic: Aorta is tortuous as are the iliac vessels. No aneurysm. No adenopathy. Reproductive: Uterus and adnexa unremarkable. Calcifications throughout the uterine wall. Other:  Small amount of free fluid in the pelvis.  No free air. Musculoskeletal: No acute bony abnormality. Degenerative disc and facet disease throughout the lumbar spine. IMPRESSION: Large heterogeneous enhancing mass like area involving the sigmoid colon. While there are extensive diverticular of within the sigmoid colon, I feel this is most likely a colon mass/ tumor. This abuts the bladder. There is gas within the urinary bladder which is presumably from catheterization. If the patient has not been catheterized, this would be concerning for fistula to the adjacent abnormal sigmoid colon. Small stable cystic areas within the spleen. Prior cholecystectomy. Dilated common bile duct and intrahepatic ducts likely related to post cholecystectomy state. Aorta iliac atherosclerosis. Dependent atelectasis in the lung bases. Trace bilateral pleural effusions. Electronically Signed   By: Rolm Baptise M.D.   On: 09/21/2016 12:41   Dg Abd 2 Views  Result Date: 09/17/2016 CLINICAL DATA:  Abdominal bloating EXAM: ABDOMEN - 2 VIEW COMPARISON:  CT abdomen and pelvis November 11, 2010 FINDINGS: Supine and upright images were obtained. There is diffuse stool throughout the colon. There is no bowel dilatation or air-fluid level suggesting bowel obstruction. No free air. There is extensive arterial vascular calcification in the aorta, common iliac arteries, and splenic artery. There are surgical clips in the right upper quadrant. There are scattered contrast filled colonic diverticulum. IMPRESSION: Diffuse stool throughout colon. No bowel obstruction or free air. Extensive aortoiliac and splenic artery atherosclerosis. Electronically Signed   By: Lowella Grip III M.D.   On: 09/17/2016 12:31    Time Spent in minutes  25   Louellen Molder M.D on 09/22/2016 at 2:05 PM  Between 7am to 7pm - Pager - [phone]  After 7pm go to [email or web address] - password [facility]  [facility] -  Office  [phone]

## 2016-09-23 ENCOUNTER — Encounter (HOSPITAL_COMMUNITY): Payer: Self-pay | Admitting: Gastroenterology

## 2016-09-23 ENCOUNTER — Encounter: Payer: Self-pay | Admitting: *Deleted

## 2016-09-23 ENCOUNTER — Inpatient Hospital Stay (HOSPITAL_COMMUNITY): Payer: Medicare Other

## 2016-09-23 MED ORDER — CHLORHEXIDINE GLUCONATE CLOTH 2 % EX PADS: 6.0000 | MEDICATED_PAD | Freq: Once | CUTANEOUS | Status: DC

## 2016-09-23 MED ORDER — ENSURE ENLIVE PO LIQD: 237.0000 mL | Freq: Two times a day (BID) | ORAL | Status: DC

## 2016-09-23 MED ORDER — SODIUM CHLORIDE 0.9 % IV SOLN
INTRAVENOUS | Status: DC
  Administered 2016-09-23 (×2): via INTRAVENOUS

## 2016-09-23 MED ORDER — ENSURE ENLIVE PO LIQD
237.0000 mL | Freq: Three times a day (TID) | ORAL | Status: DC
  Administered 2016-09-26 – 2016-09-27 (×3): 237 mL via ORAL

## 2016-09-23 MED ORDER — DEXTROSE 5 % IV SOLN
2.0000 g | INTRAVENOUS | Status: AC
  Administered 2016-09-24: 2 g via INTRAVENOUS
  Filled 2016-09-23: qty 2

## 2016-09-23 MED ORDER — CHLORHEXIDINE GLUCONATE CLOTH 2 % EX PADS
6.0000 | MEDICATED_PAD | Freq: Once | CUTANEOUS | Status: AC
  Administered 2016-09-23: 6 via TOPICAL

## 2016-09-23 NOTE — Progress Notes (Signed)
Oncology Nurse Navigator Documentation  Oncology Nurse Navigator Flowsheets 09/23/2016  Navigator Location [facility]  Referral date to RadOnc/MedOnc 09/23/2016  Navigator Encounter Type Letter/Fax/Email  Abnormal Finding Date 09/22/2016  Confirmed Diagnosis Date 09/22/2016  Scheduled new referral with Dr. Burr Medico on 10/14/16 at 2:30 pm. Message to HIM Seth Bake) to call daughter with appointment.

## 2016-09-23 NOTE — Progress Notes (Signed)
Colonic mass  Subjective: No acute changes  Objective: Vital signs in last 24 hours: Temp:  [97.6 F (36.4 C)-99.8 F (37.7 C)] 98.6 F (37 C) (01/04 0545) Pulse Rate:  [86-103] 102 (01/04 0545) Resp:  [17-35] 18 (01/04 0545) BP: (118-158)/(62-86) 137/86 (01/04 0545) SpO2:  [95 %-100 %] 97 % (01/04 0545) Weight:  [69.4 kg (153 lb)] 69.4 kg (153 lb) (01/03 1136) Last BM Date: 09/21/16  Intake/Output from previous day: 01/03 0701 - 01/04 0700 In: 1923.3 [I.V.:1923.3] Out: 0  Intake/Output this shift: No intake/output data recorded.  General appearance: alert and cooperative GI: normal findings: soft, non-tender  Lab Results:  Results for orders placed or performed during the hospital encounter of 09/21/16 (from the past 24 hour(s))  Prealbumin     Status: Abnormal   Collection Time: 09/22/16  4:30 PM  Result Value Ref Range   Prealbumin <5 (L) 18 - 38 mg/dL  Type and screen [place]     Status: None   Collection Time: 09/22/16  4:30 PM  Result Value Ref Range   ABO/RH(D) O POS    Antibody Screen NEG    Sample Expiration 09/25/2016   ABO/Rh     Status: None   Collection Time: 09/22/16  4:30 PM  Result Value Ref Range   ABO/RH(D) O POS      Studies/Results Radiology     MEDS, Scheduled . cefTRIAXone (ROCEPHIN)  IV  1 g Intravenous Q24H  . enoxaparin (LOVENOX) injection  40 mg Subcutaneous Q24H  . feeding supplement (ENSURE ENLIVE)  237 mL Oral BID BM  . fluconazole (DIFLUCAN) IV  100 mg Intravenous Q24H  . fluticasone  1 spray Each Nare Daily  . nystatin  5 mL Oral QID  . nystatin cream  1 application Topical BID  . senna  1 tablet Oral BID     Assessment: Colonic mass   Plan: F/u path.    Severe malnutrition: ensure between meals.  Nutrition is too poor for definitve surgery.  Will plan on diverting colostomy tom am as long as primary team feels that she would tolerate this.  Will have pt marked for ostomy.  Will place picc as  pt may need tpn.   LOS: 1 day    Rosario Adie, [facility], [place]   09/23/2016 8:42 AM

## 2016-09-23 NOTE — Evaluation (Signed)
Occupational Therapy Evaluation Patient Details Name: Emily Mills MRN: [id number] DOB: 1931-07-20 Today's Date: 09/23/2016    History of Present Illness HPI: Emily Mills is a 83 y.o. female with medical history significant of HTN, prior hx of melanoma s/p resection, GERD, presents to the ED with complaints of worsening diarrhea, worsening generalized weakness, lightheadedness and a 40 pound weight loss over the past 6 months.; pt was recentlly admitted with frequent falls   Clinical Impression   Pt admitted with colonic obstruction. Pt currently with functional limitations due to the deficits listed below (see OT Problem List). Pt will benefit from skilled OT to increase their safety and independence with ADL and functional mobility for ADL to facilitate discharge to venue listed below.      Follow Up Recommendations  SNF    Equipment Recommendations  None recommended by OT       Precautions / Restrictions Precautions Precautions: Fall      Mobility Bed Mobility Overal bed mobility: Needs Assistance Bed Mobility: Supine to Sit Rolling: Mod assist   Supine to sit: Mod assist        Transfers                 General transfer comment: did notperform         ADL Overall ADL's : Needs assistance/impaired     Grooming: Minimal assistance;Sitting                         Toileting - Clothing Manipulation Details (indicate cue type and reason): Pt sat EOB with OT.  Pts BP decreased in sitting 104/57. Pt did complain of light dizziness and was returned to supine. RN aware                       Pertinent Vitals/Pain Pain Assessment: No/denies pain        Extremity/Trunk Assessment Upper Extremity Assessment Upper Extremity Assessment: Generalized weakness           Communication Communication Communication: No difficulties   Cognition Arousal/Alertness: Awake/alert Behavior During Therapy: WFL for tasks  assessed/performed Overall Cognitive Status: Within Functional Limits for tasks assessed                                Home Living Family/patient expects to be discharged to:: Skilled nursing facility                                 Additional Comments: pt resides at [facility]; she has been in rehab since last admission in Dec - plans on SNF post sx               OT Problem List: Decreased strength;Decreased activity tolerance;Impaired balance (sitting and/or standing)   OT Treatment/Interventions: Self-care/ADL training;DME and/or AE instruction;Patient/family education    OT Goals(Current goals can be found in the care plan section) Acute Rehab OT Goals Patient Stated Goal: feel better OT Goal Formulation: With patient Time For Goal Achievement: 10/07/16  OT Frequency: Min 2X/week   Barriers to D/C: Decreased caregiver support             End of Session Nurse Communication: Mobility status  Activity Tolerance: Patient tolerated treatment well Patient left: in bed;with call bell/phone within reach;with family/visitor present   Time: [id number] OT Time Calculation (min):  24 min Charges:  OT General Charges $OT Visit: 1 Procedure OT Evaluation $OT Eval Moderate Complexity: 1 Procedure OT Treatments $Self Care/Home Management : 8-22 mins G-Codes:    Payton Mccallum D 09-30-2016, 2:58 PM

## 2016-09-23 NOTE — Progress Notes (Signed)
Emily Mills 11:04 AM  Subjective: Patient doing well without any obvious flexible sigmoidoscopy problems and we discussed her ostomy surgery tomorrow and she is drinking her insurer and I discussed her pathology with her daughter on the phone and answered all of her questions  Objective: Vital signs stable afebrile no acute distress abdomen is soft nontender biopsy positive for adenocarcinoma CEA pending  Assessment: Partially obstructing colon cancer  Plan: Plans per surgical team please let me know if I could be of any further assistance with this hospital stay  [facility] E  Pager [phone] After 5PM or if no answer call [phone]

## 2016-09-23 NOTE — Progress Notes (Signed)
Emily Mills returned page, with orders to restart NS, administer Tylenol and obtain blood cultures.

## 2016-09-23 NOTE — Progress Notes (Signed)
Initial Nutrition Assessment  DOCUMENTATION CODES:   Severe malnutrition in context of chronic illness  INTERVENTION:   -Increase Ensure Enlive po TID, each supplement provides 350 kcal and 20 grams of protein -Encourage PO intake -Placed order for lunch -RD to continue to monitor for plan  NUTRITION DIAGNOSIS:   Malnutrition related to chronic illness as evidenced by energy intake < or equal to 75% for > or equal to 1 month, moderate depletion of body fat, severe depletion of muscle mass.  GOAL:   Patient will meet greater than or equal to 90% of their needs  MONITOR:   PO intake, Supplement acceptance, Labs, Weight trends, I & O's  REASON FOR ASSESSMENT:   Consult Assessment of nutrition requirement/status  ASSESSMENT:   83 year old female with hypertension, melanoma status post resection: GERD presented to the ED with worsening diarrhea, generalized weakness, lightheadedness and almost 40 pound weight loss for the past 6 months. Patient was admitted to the hospital in October for worsening symptoms when she was diagnosed with pneumonia and UTI and discharged to rehabilitation. She was then discharged to an assisted-living. GI saw her on 12/28 for ongoing diarrhea, performed an x-ray which was concerning for bowel obstruction. Colonoscopy was recommended but patient refused. 1/3: s/p FLEXIBLE SIGMOIDOSCOPY   Patient in room with no family at bedside. Pt HOH. Pt reports eating a good breakfast of french toast, potatoes and juice. Per chart she completed 75% of this meal. Pt states her appetite is good when there is good food to eat. States she does not like the food at the facility where she lives. She states that sometimes she feels nauseous and light headed. She currently denies any swallowing issues but states that she does need softer food d/t not having her dentures here at the hospital. Pt states she doesn't want her dentures here because she is afraid she will lose them.   She denies any mouth pain. She denies sensitivity to hot or cold food temperatures.   RD provided pt with chocolate Ensure supplement during visit. Pt began to drink prior to end of visit. Pt states she likes them. Encouraged pt to continue to drink supplements when provided between meals. If patient consumes 3 Ensures, this will provide 1050 kcal (68% of needs) and 60g protein(80% of needs). Noted plan for TPN if nutrition status worsens.  Pt unable to provide much weight history. Per RN outside of room, patient has presented with some confusion today. When told of her current weight she states "yes that sounds about right". No weight history in chart reveals no changes in weight but question since there are no fluctuations in record. Per H&P, pt has lost 40-50 lb over the last 6 months. Despite this uncertain information, pt still meets criteria for severe malnutrition.  Nutrition-Focused physical exam completed. Findings are moderate fat depletion, severe muscle depletion, and no edema.   Medications: Senokot tablet BID, IV Zofran PRN Labs reviewed: Mg/K WNL  Diet Order:  DIET SOFT Room service appropriate? Yes; Fluid consistency: Thin  Skin:  Reviewed, no issues  Last BM:  1/2  Height:   Ht Readings from Last 1 Encounters:  09/22/16 5\' 1"  (1.549 m)    Weight:   Wt Readings from Last 1 Encounters:  09/22/16 153 lb (69.4 kg)    Ideal Body Weight:  47.7 kg  BMI:  Body mass index is 28.91 kg/m.  Estimated Nutritional Needs:   Kcal:  1550-1750  Protein:  75-85g  Fluid:  1.5-1.7L/day  EDUCATION NEEDS:   No education needs identified at this time  Clayton Bibles, MS, RD, LDN Pager: [phone] After Hours Pager: [phone]

## 2016-09-23 NOTE — Progress Notes (Addendum)
PROGRESS NOTE                                                                                                                                                                                                             Patient Demographics:    Emily Mills, is a 83 y.o. female, DOB - 02/26/1931, [id number]  Admit date - 09/21/2016   Admitting Physician Eugenie Filler, MD  Outpatient Primary MD for the patient is London Pepper, MD  LOS - 1  Outpatient Specialists: [facility]  Chief Complaint  Patient presents with  . Diarrhea       Brief Narrative   83 year old female with hypertension, melanoma status post resection: GERD presented to the ED with worsening diarrhea, generalized weakness, lightheadedness and almost 40 pound weight loss for the past 6 months. Patient was admitted to the hospital in October for worsening symptoms when she was diagnosed with pneumonia and UTI and discharged to rehabilitation. She was then discharged to an assisted-living. GI saw her on 12/28 for ongoing diarrhea, performed an x-ray which was concerning for bowel obstruction. Colonoscopy was recommended but patient refused. Given significant worsening diarrhea and progressive weakness she was brought to the ED. CT abdomen and pelvis showed large heterogenous enhancing mass likely involving the sigmoid colon.   Subjective:   Feels ok, wondering abt Colostomy  Assessment  & Plan :   Colonic mass with obstruction -Complete obstructing tumor in the mid sigmoid colon on colonoscopy,  -pathology with invasive adenocarcinoma -CCS consulting, prealbumin <5-nutritional status very poor -diverting colostomy planned for tomorrow -she is low cardiac risk -no CAD/CHF or h/o significant valvular disease, however nutritional status puts her at risk for issues with wound healing etc   Possible UTI (urinary tract infection) -on empiric Rocephin day  2, FU cultures   FTT (failure to thrive) in adult/Severe malnutrition -may need TNA soon, supplements as tolerated  Oral thrush -On IV Diflucan.  Anemia-iron deficiency -from GI blood loss/colon CA -monitor, transfuse if <7  Diarrhea -Secondary to obstructing colon mass. On bowel regimen and hydration.  DVt proph: SCDs, start lovenox post surgery  Code Status : DNR Family Communication  : no family at bedside Disposition Plan  : May need SNF  Consults  :  [facility], [facility]  Procedures  : Colonoscopy with biopsy  Lab Results  Component Value Date   PLT 285 09/22/2016    Antibiotics  :    Anti-infectives    Start     Dose/Rate Route Frequency Ordered Stop   09/22/16 1700  fluconazole (DIFLUCAN) IVPB 100 mg     100 mg 50 mL/hr over 60 Minutes Intravenous Every 24 hours 09/21/16 1611     09/21/16 1700  fluconazole (DIFLUCAN) IVPB 200 mg     200 mg 100 mL/hr over 60 Minutes Intravenous  Once 09/21/16 1608 09/21/16 2030   09/21/16 1700  cefTRIAXone (ROCEPHIN) 1 g in dextrose 5 % 50 mL IVPB     1 g 100 mL/hr over 30 Minutes Intravenous Every 24 hours 09/21/16 1608          Objective:   Vitals:   09/22/16 2134 09/23/16 0545 09/23/16 0906 09/23/16 1028  BP: 124/74 137/86 135/88   Pulse: (!) 103 (!) 102 (!) 136   Resp: 18 18 18    Temp: 99.8 F (37.7 C) 98.6 F (37 C) (!) 101.7 F (38.7 C) (!) 100.4 F (38 C)  TempSrc: Oral Oral Oral   SpO2: 100% 97% 95%   Weight:      Height:        Wt Readings from Last 3 Encounters:  09/22/16 69.4 kg (153 lb)  09/08/16 69.4 kg (153 lb)  08/24/16 69.4 kg (153 lb)     Intake/Output Summary (Last 24 hours) at 09/23/16 1410 Last data filed at 09/23/16 1027  Gross per 24 hour  Intake          1963.34 ml  Output                0 ml  Net          1963.34 ml     Physical Exam  Gen: elderly, frail, AAOx3, no distress HEENT:  pallor+, moist mucosa, supple neck Chest: CTAB CVS: N S1&S2, no murmurs, GI: soft, NT, ND,  BS+ Musculoskeletal: warm, 1plus edema CNS: AAOX2, non focal    Data Review:    CBC  Recent Labs Lab 09/21/16 0905 09/22/16 0514  WBC 7.0 5.8  HGB 10.1* 9.1*  HCT 33.9* 29.8*  PLT 278 285  MCV 79.0 80.8  MCH 23.5* 24.7*  MCHC 29.8* 30.5  RDW 15.7* 15.8*    Chemistries   Recent Labs Lab 09/21/16 0905 09/21/16 1713 09/22/16 0514  NA 136  --  135  K 4.0  --  3.7  CL 99*  --  103  CO2 28  --  24  GLUCOSE 124*  --  106*  BUN 13  --  11  CREATININE 0.35*  --  0.52  CALCIUM 7.9*  --  7.3*  MG  --  2.1  --   AST 28  --  25  ALT 13*  --  11*  ALKPHOS 159*  --  153*  BILITOT 0.4  --  0.6   ------------------------------------------------------------------------------------------------------------------ No results for input(s): CHOL, HDL, LDLCALC, TRIG, CHOLHDL, LDLDIRECT in the last 72 hours.  Lab Results  Component Value Date   HGBA1C 6.1 (H) 08/20/2016   ------------------------------------------------------------------------------------------------------------------ No results for input(s): TSH, T4TOTAL, T3FREE, THYROIDAB in the last 72 hours.  Invalid input(s): FREET3 ------------------------------------------------------------------------------------------------------------------  Recent Labs  09/21/16 1713  VITAMINB12 786  FOLATE 20.7  FERRITIN 128  TIBC 130*  IRON 6*    Coagulation profile  Recent Labs Lab 09/22/16 0514  INR 1.20    No results for input(s): DDIMER in the last  72 hours.  Cardiac Enzymes No results for input(s): CKMB, TROPONINI, MYOGLOBIN in the last 168 hours.  Invalid input(s): CK ------------------------------------------------------------------------------------------------------------------ No results found for: BNP  Inpatient Medications  Scheduled Meds: . cefTRIAXone (ROCEPHIN)  IV  1 g Intravenous Q24H  . enoxaparin (LOVENOX) injection  40 mg Subcutaneous Q24H  . feeding supplement (ENSURE ENLIVE)  237 mL  Oral TID BM  . fluconazole (DIFLUCAN) IV  100 mg Intravenous Q24H  . fluticasone  1 spray Each Nare Daily  . nystatin  5 mL Oral QID  . nystatin cream  1 application Topical BID  . senna  1 tablet Oral BID   Continuous Infusions: . sodium chloride 75 mL/hr at 09/23/16 0945   PRN Meds:.acetaminophen **OR** acetaminophen, ketorolac, morphine injection, ondansetron **OR** ondansetron (ZOFRAN) IV, simethicone, sodium phosphate, sorbitol, traMADol  Micro Results Recent Results (from the past 240 hour(s))  Urine culture     Status: Abnormal   Collection Time: 09/21/16  2:19 PM  Result Value Ref Range Status   Specimen Description URINE, CATHETERIZED  Final   Special Requests NONE  Final   Culture MULTIPLE SPECIES PRESENT, SUGGEST RECOLLECTION (A)  Final   Report Status 09/22/2016 FINAL  Final    Radiology Reports Dg Chest 2 View  Result Date: 09/17/2016 CLINICAL DATA:  Recent pneumonia EXAM: CHEST  2 VIEW COMPARISON:  Chest radiograph August 18, 2016 and chest CT August 18, 2016 FINDINGS: There has been virtually complete clearing of infiltrate from the left upper lobe. Minimal atelectasis remains in this area currently. The lungs elsewhere clear. Heart size and pulmonary vascularity are normal. No adenopathy. There is atherosclerotic calcification aorta. There is also calcification of the mitral annulus. There is degenerative change in the thoracic spine. IMPRESSION: Essentially complete clearing of left upper lobe infiltrate compared to 1 month prior. No new opacity. Stable cardiac silhouette. There is aortic atherosclerosis. Electronically Signed   By: Lowella Grip III M.D.   On: 09/17/2016 12:33   Ct Abdomen Pelvis W Contrast  Result Date: 09/21/2016 CLINICAL DATA:  Lower abdominal pain, vomiting. EXAM: CT ABDOMEN AND PELVIS WITH CONTRAST TECHNIQUE: Multidetector CT imaging of the abdomen and pelvis was performed using the standard protocol following bolus administration of  intravenous contrast. CONTRAST:  138mL ISOVUE-300 IOPAMIDOL (ISOVUE-300) INJECTION 61% COMPARISON:  11/11/2010 FINDINGS: Lower chest: Mild cardiomegaly. Dense mitral valve annular calcifications. Dependent atelectasis in the lung bases. Trace bilateral pleural effusions. Hepatobiliary: Prior cholecystectomy. Common bile duct and intrahepatic ducts are slightly prominent, likely related to post cholecystectomy state. Common bile duct 13 mm in the pancreatic head. Pancreas: Diffuse pancreatic atrophy. No duct dilatation or focal abnormality. Dense calcifications throughout the splenic vein in the region of the pancreas. Spleen: Small low-density areas in the spleen are stable since prior study, likely small cysts. Adrenals/Urinary Tract: 3.5 cm cyst in the midpole of the right kidney, slightly larger than prior study. No stones or hydronephrosis. Adrenal glands unremarkable. Gas noted within the urinary bladder, presumably from recent catheterization. Stomach/Bowel: Sigmoid diverticulosis. There is a large mass like heterogeneous area in the sigmoid colon with surrounding inflammatory change. This is concerning for large colonic mass/ tumor. This is difficult to measure due to irregular margins and diffuse nature of the process. Heterogeneous enhancing area on image 71 measures 10.1 x 5.9 cm. This masslike area abuts the superior aspect of the urinary bladder. Large stool burden noted throughout the remainder of the colon. Stomach and small bowel are decompressed. Vascular/Lymphatic: Aorta is tortuous as are  the iliac vessels. No aneurysm. No adenopathy. Reproductive: Uterus and adnexa unremarkable. Calcifications throughout the uterine wall. Other: Small amount of free fluid in the pelvis.  No free air. Musculoskeletal: No acute bony abnormality. Degenerative disc and facet disease throughout the lumbar spine. IMPRESSION: Large heterogeneous enhancing mass like area involving the sigmoid colon. While there are  extensive diverticular of within the sigmoid colon, I feel this is most likely a colon mass/ tumor. This abuts the bladder. There is gas within the urinary bladder which is presumably from catheterization. If the patient has not been catheterized, this would be concerning for fistula to the adjacent abnormal sigmoid colon. Small stable cystic areas within the spleen. Prior cholecystectomy. Dilated common bile duct and intrahepatic ducts likely related to post cholecystectomy state. Aorta iliac atherosclerosis. Dependent atelectasis in the lung bases. Trace bilateral pleural effusions. Electronically Signed   By: Rolm Baptise M.D.   On: 09/21/2016 12:41   Dg Abd 2 Views  Result Date: 09/17/2016 CLINICAL DATA:  Abdominal bloating EXAM: ABDOMEN - 2 VIEW COMPARISON:  CT abdomen and pelvis November 11, 2010 FINDINGS: Supine and upright images were obtained. There is diffuse stool throughout the colon. There is no bowel dilatation or air-fluid level suggesting bowel obstruction. No free air. There is extensive arterial vascular calcification in the aorta, common iliac arteries, and splenic artery. There are surgical clips in the right upper quadrant. There are scattered contrast filled colonic diverticulum. IMPRESSION: Diffuse stool throughout colon. No bowel obstruction or free air. Extensive aortoiliac and splenic artery atherosclerosis. Electronically Signed   By: Lowella Grip III M.D.   On: 09/17/2016 12:31    Time Spent in minutes  25   Tamila Gaulin M.D on 09/23/2016 at 2:10 PM  Between 7am to 7pm - Pager - [phone] After 7pm go to [email or web address] - password [facility]  [facility] -  Office  [phone]

## 2016-09-23 NOTE — Progress Notes (Signed)
BP 135/88 Pulse 136, Temp 101.7, Text page sent to Fanny Bien, Code Sepsis activated

## 2016-09-23 NOTE — Consult Note (Signed)
[facility] Nurse ostomy consult note [place] Nurse requested for preoperative stoma site marking by Dr. Leighton Ruff via Modena Jansky, [place] PA.  Discussed surgical procedure and stoma creation with patient and family (daughter).  Explained role of the [place] nurse team.  Provided the patient with educational booklet and demonstrated samples of pouching options.  Answered patient and family questions.   Examined patient lying, sitting, and standing in order to place the marking in the patient's visual field, away from any creases or abdominal contour issues and within the rectus muscle.  Patient with pendulous breasts and the need to wear an adult brief, so mark is made above the umbilicus.  Marked for colostomy in the LUQ  6.5cm to the left of the umbilicus and  2cm above the umbilicus.  Patient's abdomen cleansed with CHG wipes at site markings, allowed to air dry prior to marking.Covered mark with thin film transparent dressing to preserve mark until date of surgery (tomorrow, 09/24/16).  [place] nursing team will remain available to this patient, the nursing, surgical and medical teams.  Thanks, Maudie Flakes, MSN, RN, [place], Arther Abbott  Pager# [phone]

## 2016-09-24 ENCOUNTER — Inpatient Hospital Stay (HOSPITAL_COMMUNITY): Payer: Medicare Other | Admitting: Anesthesiology

## 2016-09-24 ENCOUNTER — Telehealth: Payer: Self-pay | Admitting: *Deleted

## 2016-09-24 ENCOUNTER — Encounter (HOSPITAL_COMMUNITY)
Admission: EM | Disposition: A | Discharge status: Skilled Nursing Facility | Payer: Self-pay | Source: Home / Self Care | Attending: Internal Medicine

## 2016-09-24 ENCOUNTER — Telehealth: Payer: Self-pay | Admitting: Hematology

## 2016-09-24 ENCOUNTER — Encounter: Payer: Self-pay | Admitting: Hematology

## 2016-09-24 ENCOUNTER — Encounter (HOSPITAL_COMMUNITY): Payer: Self-pay | Admitting: Anesthesiology

## 2016-09-24 DIAGNOSIS — J181 Lobar pneumonia, unspecified organism: Secondary | ICD-10-CM

## 2016-09-24 DIAGNOSIS — R509 Fever, unspecified: Secondary | ICD-10-CM

## 2016-09-24 DIAGNOSIS — R600 Localized edema: Secondary | ICD-10-CM

## 2016-09-24 HISTORY — PX: LAPAROSCOPIC DIVERTED COLOSTOMY: SHX5892

## 2016-09-24 LAB — CBC
HCT: 31.3 % — ABNORMAL LOW (ref 36.0–46.0)
Hemoglobin: 9.7 g/dL — ABNORMAL LOW (ref 12.0–15.0)
MCH: 24.7 pg — ABNORMAL LOW (ref 26.0–34.0)
MCHC: 31 g/dL (ref 30.0–36.0)
MCV: 79.6 fL (ref 78.0–100.0)
PLATELETS: 266 10*3/uL (ref 150–400)
RBC: 3.93 MIL/uL (ref 3.87–5.11)
RDW: 15.7 % — ABNORMAL HIGH (ref 11.5–15.5)
WBC: 8.8 10*3/uL (ref 4.0–10.5)

## 2016-09-24 LAB — BASIC METABOLIC PANEL
ANION GAP: 12 (ref 5–15)
BUN: 12 mg/dL (ref 6–20)
CO2: 22 mmol/L (ref 22–32)
Calcium: 7.7 mg/dL — ABNORMAL LOW (ref 8.9–10.3)
Chloride: 104 mmol/L (ref 101–111)
Creatinine, Ser: 0.68 mg/dL (ref 0.44–1.00)
GFR calc Af Amer: >60 mL/min (ref >60)
Glucose, Bld: 111 mg/dL — ABNORMAL HIGH (ref 65–99)
POTASSIUM: 3.7 mmol/L (ref 3.5–5.1)
SODIUM: 138 mmol/L (ref 135–145)

## 2016-09-24 LAB — CEA: CEA: 37.5 ng/mL — ABNORMAL HIGH (ref 0.0–4.7)

## 2016-09-24 LAB — SURGICAL PCR SCREEN
MRSA, PCR: POSITIVE — AB
Staphylococcus aureus: POSITIVE — AB

## 2016-09-24 SURGERY — CREATION, COLOSTOMY, DIVERTING, LAPAROSCOPIC
Anesthesia: General

## 2016-09-24 MED ORDER — LACTATED RINGERS IR SOLN
Status: DC | PRN
  Administered 2016-09-24: 3000 mL

## 2016-09-24 MED ORDER — ONDANSETRON HCL 4 MG/2ML IJ SOLN
INTRAMUSCULAR | Status: DC | PRN
  Administered 2016-09-24: 4 mg via INTRAVENOUS

## 2016-09-24 MED ORDER — LACTATED RINGERS IV SOLN
INTRAVENOUS | Status: DC | PRN
  Administered 2016-09-24: 11:00:00 via INTRAVENOUS

## 2016-09-24 MED ORDER — PHENYLEPHRINE 40 MCG/ML (10ML) SYRINGE FOR IV PUSH (FOR BLOOD PRESSURE SUPPORT)
PREFILLED_SYRINGE | INTRAVENOUS | Status: AC
  Filled 2016-09-24: qty 10

## 2016-09-24 MED ORDER — BOOST / RESOURCE BREEZE PO LIQD
1.0000 | Freq: Three times a day (TID) | ORAL | Status: DC
Start: 2016-09-24 — End: 2016-09-27
  Administered 2016-09-24: 1 via ORAL
  Administered 2016-09-25: 20:00:00 via ORAL
  Administered 2016-09-25 – 2016-09-26 (×5): 1 via ORAL

## 2016-09-24 MED ORDER — LACTATED RINGERS IV SOLN
INTRAVENOUS | Status: DC | PRN
  Administered 2016-09-24: 12:00:00 via INTRAVENOUS

## 2016-09-24 MED ORDER — CHLORHEXIDINE GLUCONATE CLOTH 2 % EX PADS
6.0000 | MEDICATED_PAD | Freq: Every day | CUTANEOUS | Status: AC
  Administered 2016-09-25 – 2016-09-29 (×5): 6 via TOPICAL

## 2016-09-24 MED ORDER — ONDANSETRON HCL 4 MG/2ML IJ SOLN
INTRAMUSCULAR | Status: AC
  Filled 2016-09-24: qty 2

## 2016-09-24 MED ORDER — PROPOFOL 10 MG/ML IV BOLUS
INTRAVENOUS | Status: AC
  Filled 2016-09-24: qty 20

## 2016-09-24 MED ORDER — LABETALOL HCL 5 MG/ML IV SOLN
INTRAVENOUS | Status: AC
  Filled 2016-09-24: qty 4

## 2016-09-24 MED ORDER — PROPOFOL 10 MG/ML IV BOLUS
INTRAVENOUS | Status: DC | PRN
  Administered 2016-09-24: 100 mg via INTRAVENOUS

## 2016-09-24 MED ORDER — ALBUMIN HUMAN 5 % IV SOLN
INTRAVENOUS | Status: AC
  Filled 2016-09-24: qty 250

## 2016-09-24 MED ORDER — BUPIVACAINE LIPOSOME 1.3 % IJ SUSP
20.0000 mL | Freq: Once | INTRAMUSCULAR | Status: AC
  Administered 2016-09-24: 20 mL
  Filled 2016-09-24: qty 20

## 2016-09-24 MED ORDER — FENTANYL CITRATE (PF) 100 MCG/2ML IJ SOLN
INTRAMUSCULAR | Status: DC | PRN
  Administered 2016-09-24 (×2): 50 ug via INTRAVENOUS

## 2016-09-24 MED ORDER — ROCURONIUM BROMIDE 100 MG/10ML IV SOLN
INTRAVENOUS | Status: DC | PRN
  Administered 2016-09-24: 10 mg via INTRAVENOUS
  Administered 2016-09-24: 30 mg via INTRAVENOUS

## 2016-09-24 MED ORDER — METOPROLOL TARTRATE 5 MG/5ML IV SOLN
INTRAVENOUS | Status: DC | PRN
  Administered 2016-09-24 (×2): 1 mg via INTRAVENOUS

## 2016-09-24 MED ORDER — ALBUMIN HUMAN 5 % IV SOLN
INTRAVENOUS | Status: DC | PRN
  Administered 2016-09-24 (×2): via INTRAVENOUS

## 2016-09-24 MED ORDER — BUPIVACAINE HCL (PF) 0.25 % IJ SOLN
INTRAMUSCULAR | Status: DC | PRN
  Administered 2016-09-24: 28 mL

## 2016-09-24 MED ORDER — FENTANYL CITRATE (PF) 100 MCG/2ML IJ SOLN
INTRAMUSCULAR | Status: AC
  Filled 2016-09-24: qty 2

## 2016-09-24 MED ORDER — PHENYLEPHRINE HCL 10 MG/ML IJ SOLN
INTRAMUSCULAR | Status: DC | PRN
  Administered 2016-09-24 (×3): 80 ug via INTRAVENOUS

## 2016-09-24 MED ORDER — MUPIROCIN 2 % EX OINT
1.0000 "application " | TOPICAL_OINTMENT | Freq: Two times a day (BID) | CUTANEOUS | Status: AC
  Administered 2016-09-24 – 2016-09-28 (×10): 1 via NASAL
  Filled 2016-09-24 (×2): qty 22

## 2016-09-24 MED ORDER — LIDOCAINE HCL (CARDIAC) 20 MG/ML IV SOLN
INTRAVENOUS | Status: DC | PRN
  Administered 2016-09-24: 60 mg via INTRAVENOUS

## 2016-09-24 MED ORDER — SODIUM CHLORIDE 0.9 % IJ SOLN
INTRAMUSCULAR | Status: AC
  Filled 2016-09-24: qty 50

## 2016-09-24 MED ORDER — BUPIVACAINE HCL (PF) 0.25 % IJ SOLN
INTRAMUSCULAR | Status: AC
  Filled 2016-09-24: qty 30

## 2016-09-24 MED ORDER — SUGAMMADEX SODIUM 200 MG/2ML IV SOLN
INTRAVENOUS | Status: DC | PRN
  Administered 2016-09-24: 150 mg via INTRAVENOUS

## 2016-09-24 MED ORDER — CEFOTETAN DISODIUM-DEXTROSE 2-2.08 GM-% IV SOLR
INTRAVENOUS | Status: AC
  Filled 2016-09-24: qty 50

## 2016-09-24 MED ORDER — LIDOCAINE 2% (20 MG/ML) 5 ML SYRINGE
INTRAMUSCULAR | Status: AC
  Filled 2016-09-24: qty 5

## 2016-09-24 MED ORDER — SODIUM CHLORIDE 0.9 % IV SOLN
INTRAVENOUS | Status: DC
  Administered 2016-09-24 – 2016-09-26 (×5): via INTRAVENOUS

## 2016-09-24 MED ORDER — ROCURONIUM BROMIDE 50 MG/5ML IV SOSY
PREFILLED_SYRINGE | INTRAVENOUS | Status: AC
  Filled 2016-09-24: qty 5

## 2016-09-24 SURGICAL SUPPLY — 73 items
APPLIER CLIP 5 13 M/L LIGAMAX5 (MISCELLANEOUS)
BLADE EXTENDED COATED 6.5IN (ELECTRODE) IMPLANT
CABLE HIGH FREQUENCY MONO STRZ (ELECTRODE) ×3 IMPLANT
CATH ROBINSON RED A/P 18FR (CATHETERS) ×3 IMPLANT
CELLS DAT CNTRL 66122 CELL SVR (MISCELLANEOUS) IMPLANT
CHLORAPREP W/TINT 26ML (MISCELLANEOUS) IMPLANT
CLIP APPLIE 5 13 M/L LIGAMAX5 (MISCELLANEOUS) IMPLANT
COUNTER NEEDLE 20 DBL MAG RED (NEEDLE) ×3 IMPLANT
COVER MAYO STAND STRL (DRAPES) IMPLANT
COVER SURGICAL LIGHT HANDLE (MISCELLANEOUS) ×3 IMPLANT
DECANTER SPIKE VIAL GLASS SM (MISCELLANEOUS) IMPLANT
DERMABOND ADVANCED (GAUZE/BANDAGES/DRESSINGS) ×2
DERMABOND ADVANCED .7 DNX12 (GAUZE/BANDAGES/DRESSINGS) ×1 IMPLANT
DRAIN CHANNEL 19F RND (DRAIN) IMPLANT
DRAPE LAPAROSCOPIC ABDOMINAL (DRAPES) ×3 IMPLANT
DRAPE SURG IRRIG POUCH 19X23 (DRAPES) IMPLANT
DRSG OPSITE POSTOP 4X10 (GAUZE/BANDAGES/DRESSINGS) IMPLANT
DRSG OPSITE POSTOP 4X6 (GAUZE/BANDAGES/DRESSINGS) IMPLANT
DRSG OPSITE POSTOP 4X8 (GAUZE/BANDAGES/DRESSINGS) IMPLANT
ELECT PENCIL ROCKER SW 15FT (MISCELLANEOUS) IMPLANT
ELECT REM PT RETURN 15FT ADLT (MISCELLANEOUS) IMPLANT
EVACUATOR SILICONE 100CC (DRAIN) IMPLANT
GAUZE SPONGE 4X4 12PLY STRL (GAUZE/BANDAGES/DRESSINGS) IMPLANT
GLOVE BIO SURGEON STRL SZ 6.5 (GLOVE) ×4 IMPLANT
GLOVE BIO SURGEONS STRL SZ 6.5 (GLOVE) ×2
GLOVE BIOGEL PI IND STRL 7.0 (GLOVE) ×2 IMPLANT
GLOVE BIOGEL PI INDICATOR 7.0 (GLOVE) ×4
GOWN STRL REUS W/TWL 2XL LVL3 (GOWN DISPOSABLE) ×3 IMPLANT
GOWN STRL REUS W/TWL XL LVL3 (GOWN DISPOSABLE) ×12 IMPLANT
GRASPER ENDOPATH ANVIL 10MM (MISCELLANEOUS) IMPLANT
HOLDER FOLEY CATH W/STRAP (MISCELLANEOUS) ×3 IMPLANT
IRRIG SUCT STRYKERFLOW 2 WTIP (MISCELLANEOUS) ×3
IRRIGATION SUCT STRKRFLW 2 WTP (MISCELLANEOUS) ×1 IMPLANT
LEGGING LITHOTOMY PAIR STRL (DRAPES) IMPLANT
LUBRICANT JELLY K Y 4OZ (MISCELLANEOUS) IMPLANT
NS IRRIG 1000ML POUR BTL (IV SOLUTION) ×6 IMPLANT
PACK COLON (CUSTOM PROCEDURE TRAY) ×3 IMPLANT
PAD POSITIONING PINK XL (MISCELLANEOUS) ×3 IMPLANT
PORT LAP GEL ALEXIS MED 5-9CM (MISCELLANEOUS) IMPLANT
POSITIONER SURGICAL ARM (MISCELLANEOUS) ×3 IMPLANT
RTRCTR WOUND ALEXIS 18CM MED (MISCELLANEOUS)
SCISSORS LAP 5X35 DISP (ENDOMECHANICALS) ×3 IMPLANT
SEALER TISSUE G2 CVD JAW 45CM (ENDOMECHANICALS) ×3 IMPLANT
SEALER TISSUE G2 STRG ARTC 35C (ENDOMECHANICALS) IMPLANT
SEALER TISSUE X1 CVD JAW (INSTRUMENTS) IMPLANT
SLEEVE XCEL OPT CAN 5 100 (ENDOMECHANICALS) ×3 IMPLANT
SPONGE DRAIN TRACH 4X4 STRL 2S (GAUZE/BANDAGES/DRESSINGS) IMPLANT
SPONGE LAP 18X18 X RAY DECT (DISPOSABLE) IMPLANT
STAPLER VISISTAT 35W (STAPLE) IMPLANT
SUT ETHILON 2 0 PS N (SUTURE) IMPLANT
SUT NOVA NAB GS-21 0 18 T12 DT (SUTURE) IMPLANT
SUT PDS AB 1 CTX 36 (SUTURE) IMPLANT
SUT PDS AB 1 TP1 96 (SUTURE) IMPLANT
SUT PROLENE 2 0 KS (SUTURE) IMPLANT
SUT SILK 2 0 (SUTURE)
SUT SILK 2 0 SH CR/8 (SUTURE) ×3 IMPLANT
SUT SILK 2-0 18XBRD TIE 12 (SUTURE) IMPLANT
SUT SILK 3 0 (SUTURE)
SUT SILK 3 0 SH CR/8 (SUTURE) ×3 IMPLANT
SUT SILK 3-0 18XBRD TIE 12 (SUTURE) IMPLANT
SUT VIC AB 2-0 SH 18 (SUTURE) ×3 IMPLANT
SUT VIC AB 4-0 PS2 27 (SUTURE) ×3 IMPLANT
SUT VICRYL 0 UR6 27IN ABS (SUTURE) ×3 IMPLANT
SYS LAPSCP GELPORT 120MM (MISCELLANEOUS)
SYSTEM LAPSCP GELPORT 120MM (MISCELLANEOUS) IMPLANT
TOWEL OR NON WOVEN STRL DISP B (DISPOSABLE) ×3 IMPLANT
TRAY FOLEY BAG SILVER LF 14FR (CATHETERS) ×3 IMPLANT
TRAY FOLEY W/METER SILVER 16FR (SET/KITS/TRAYS/PACK) IMPLANT
TROCAR BLADELESS OPT 5 100 (ENDOMECHANICALS) ×3 IMPLANT
TROCAR XCEL BLUNT TIP 100MML (ENDOMECHANICALS) ×3 IMPLANT
TUBING CONNECTING 10 (TUBING) IMPLANT
TUBING CONNECTING 10' (TUBING)
TUBING INSUF HEATED (TUBING) ×3 IMPLANT

## 2016-09-24 NOTE — Progress Notes (Signed)
Pts foley has had no output since surgery. Pt is voiding around foley. Placement of foley was checked and was not in place and unable to advance. MD nottified. Order to leave foley out for now.

## 2016-09-24 NOTE — Anesthesia Preprocedure Evaluation (Addendum)
Anesthesia Evaluation  Patient identified by MRN, date of birth, ID band Patient awake    Reviewed: Allergy & Precautions, NPO status , Patient's Chart, lab work & pertinent test results  Airway Mallampati: I  TM Distance: >3 FB Neck ROM: Full    Dental  (+) Edentulous Upper, Edentulous Lower   Pulmonary neg pulmonary ROS, pneumonia, resolved,    Pulmonary exam normal breath sounds clear to auscultation       Cardiovascular hypertension, Pt. on medications Normal cardiovascular exam Rhythm:Regular Rate:Normal     Neuro/Psych negative neurological ROS  negative psych ROS   GI/Hepatic Neg liver ROS, GERD  Medicated and Controlled,Colon mass on CT   Endo/Other    Renal/GU negative Renal ROS  negative genitourinary   Musculoskeletal negative musculoskeletal ROS (+)   Abdominal   Peds  Hematology  (+) anemia , On Lovenox   Anesthesia Other Findings   Reproductive/Obstetrics                             Anesthesia Physical  Anesthesia Plan  ASA: III  Anesthesia Plan: General   Post-op Pain Management:    Induction: Intravenous  Airway Management Planned: Oral ETT  Additional Equipment:   Intra-op Plan:   Post-operative Plan: Extubation in OR  Informed Consent: I have reviewed the patients History and Physical, chart, labs and discussed the procedure including the risks, benefits and alternatives for the proposed anesthesia with the patient or authorized representative who has indicated his/her understanding and acceptance.   Dental advisory given  Plan Discussed with: Anesthesiologist, CRNA and Surgeon  Anesthesia Plan Comments:        Anesthesia Quick Evaluation

## 2016-09-24 NOTE — Anesthesia Procedure Notes (Signed)
Procedure Name: Intubation Date/Time: 09/24/2016 11:09 AM Performed by: Glory Buff Pre-anesthesia Checklist: Patient identified, Emergency Drugs available, Suction available and Patient being monitored Patient Re-evaluated:Patient Re-evaluated prior to inductionOxygen Delivery Method: Circle system utilized Preoxygenation: Pre-oxygenation with 100% oxygen Intubation Type: IV induction Ventilation: Mask ventilation without difficulty Laryngoscope Size: Miller and 3 Grade View: Grade I Tube type: Oral Tube size: 7.0 mm Number of attempts: 1 Airway Equipment and Method: Stylet and Oral airway Placement Confirmation: ETT inserted through vocal cords under direct vision,  positive ETCO2 and breath sounds checked- equal and bilateral Secured at: 21 cm Tube secured with: Tape Dental Injury: Teeth and Oropharynx as per pre-operative assessment

## 2016-09-24 NOTE — Progress Notes (Signed)
Nutrition Follow-up  DOCUMENTATION CODES:   Severe malnutrition in context of chronic illness  INTERVENTION:   Boost Breeze po TID, each supplement provides 250 kcal and 9 grams of protein  Ensure Enlive po BID, each supplement provides 350 kcal and 20 grams of protein  NUTRITION DIAGNOSIS:   Malnutrition related to chronic illness as evidenced by energy intake < or equal to 75% for > or equal to 1 month, moderate depletion of body fat, severe depletion of muscle mass.  GOAL:   Patient will meet greater than or equal to 90% of their needs  MONITOR:   PO intake, Supplement acceptance, Labs, Weight trends, I & O's  ASSESSMENT:   83 year old female with hypertension, melanoma status post resection, GERD, and other comorbidities presented to the ED with worsening diarrhea, generalized weakness, lightheadedness and almost 40 pound weight loss for the past 6 months. Patient was admitted to the hospital in October for worsening symptoms when she was diagnosed with pneumonia and UTI and discharged to rehabilitation. She was then discharged to an assisted-living. GI saw her on 12/28 for ongoing diarrhea, performed an x-ray which was concerning for bowel obstruction. Colonoscopy was recommended but patient refused. Given significant worsening diarrhea and progressive weakness she was brought to the ED. CT abdomen and pelvis showed large heterogenous enhancing mass likely involving the sigmoid colon. Under went Flexible Sigmoidoscopy which revealed likely malignant completely obstructing tumor. Now s/p LAPAROSCOPIC SPLENIC FLEXURE AND MOBILIZATION, CREATION OF LOOP COLOSTOMY  Met with pt in room today. Pt now s/p laparscopic splenic flexure and mobilization, creation of loop colostomy today secondary to colon mass. Pt still very groggy at visit. Spoke with daughter in room who reports pt has been eating 100% prior to NPO. Pt likes BB peach and EE chocolate. Pt states that she is hungry now but is  still NPO. Suspect this pt will continue to eat well post surgery. Will order supplements. No new wt on this pt since 1/3. Pt will need education on colostomy once more alert.   Medications reviewed and include: ceftriaxone, lovenox, senokot, morphine, zofran, simethicone, tramadol   Labs reviewed: Ca 7.7(L), alb 1.6(L) 1/3, prealb <5(L) 1/3 Hgb 9.7(L), Hct 31.3(L)   Diet Order:     Skin:  Reviewed, no issues  Last BM:  1/4-diarrhea  Height:   Ht Readings from Last 1 Encounters:  09/22/16 5' 1"  (1.549 m)    Weight:   Wt Readings from Last 1 Encounters:  09/22/16 153 lb (69.4 kg)    Ideal Body Weight:  47.7 kg  BMI:  Body mass index is 28.91 kg/m.  Estimated Nutritional Needs:   Kcal:  1550-1750  Protein:  75-85g  Fluid:  1.5-1.7L/day  EDUCATION NEEDS:   Education needs no appropriate at this time  Koleen Distance, RD, [place] [facility] [place] Pager #[phone]

## 2016-09-24 NOTE — Clinical Social Work Note (Signed)
Clinical Social Work Assessment  Patient Details  Name: Emily Mills MRN: [id number] Date of Birth: Jun 15, 1931  Date of referral:  09/23/16               Reason for consult:  Facility Placement, Discharge Planning                Permission sought to share information with:  Family Supports, Customer service manager, Case Optician, dispensing granted to share information::  Yes, Verbal Permission Granted  Name::      [place] B[place])  Agency::   ([facility] )  Relationship::   (Daughter )  Contact Information:   [phone])  Housing/Transportation Living arrangements for the past 2 months:  [place] of Information:  Adult Children Patient Interpreter Needed:  None Criminal Activity/Legal Involvement Pertinent to Current Situation/Hospitalization:  No - Comment as needed Significant Relationships:  Adult Children Lives with:  Adult Children Do you feel safe going back to the place where you live?  No Need for family participation in patient care:  Yes (Comment)  Care giving concerns:  Patient from home and now requiring SNF placement.    Social Worker assessment / plan:  MSW spoke with patient's dtr, Tina at length in reference to post-acute placement. Pt's dtr is requesting STR placement at [facility]. Per facility, patient was at [facility] from 12/2-12/22, patient likely in co-pay days. Patient's dtr, Otila Kluver stated she is working with an attorney in regards to [place]. No further concerns reported at this time. Patient still on iv abx, lasix. MSW remains available as needed.   Employment status:  Retired Nurse, adult PT Recommendations:  [place] / Referral to community resources:  [place]  Patient/Family's Response to care:  Patient alert, however dtr managing discharge plans. Patient had surgery on Friday, 1/5. Pt's  dtr strongly involved in pt's care and appreciated sw intervention.   Patient/Family's Understanding of and Emotional Response to Diagnosis, Current Treatment, and Prognosis: Dtr aware and knowledgeable of medical/surgical interventions.   Emotional Assessment Appearance:  Appears stated age Attitude/Demeanor/Rapport:   (Pleasant ) Affect (typically observed):  Accepting, Calm Orientation:  Oriented to Situation, Oriented to  Time, Oriented to Place, Oriented to Self Alcohol / Substance use:  Not Applicable Psych involvement (Current and /or in the community):  No (Comment)  Discharge Needs  Concerns to be addressed:  Denies Needs/Concerns at this time Readmission within the last 30 days:  No Current discharge risk:  Dependent with Mobility Barriers to Discharge:  Continued Medical Work up   Glendon Axe A 09/24/2016, 4:17 PM

## 2016-09-24 NOTE — Telephone Encounter (Signed)
Oncology Nurse Navigator Documentation  Oncology Nurse Navigator Flowsheets 09/24/2016  Navigator Location [facility]-  Referral date to RadOnc/MedOnc -  Navigator Encounter Type Telephone  Telephone Outgoing Call  Abnormal Finding Date -  Confirmed Diagnosis Date -  Daughter had requested navigator call her to discuss the new patient appointment and wanted to know if cancer had spread anywhere else and what the node status is. Informed her according to the CT scan on 1/2 they only saw the colon mass and liver was negative for any mets. The pathology report in a few days will let us know how many of the nodes were involved from the ones removed during surgery. She feels her mother may not be able to come on 1/25 and that they may want her followed at [facility] since she may be in a SNF in [place]. She will keep navigator informed of their decision.

## 2016-09-24 NOTE — NC FL2 (Signed)
[place] LEVEL OF CARE SCREENING TOOL     IDENTIFICATION  Patient Name: Emily Mills Birthdate: 05-16-1931 Sex: female Admission Date (Current Location): 09/21/2016  [facility] and [place] Number:  Herbalist and Address:  [facility],  [address], [place]      Provider Number: [id number]  Attending Physician Name and Address:  Kerney Elbe, DO  Relative Name and Phone Number:       Current Level of Care: Hospital Recommended Level of Care: [place] Prior Approval Number:    Date Approved/Denied:   PASRR Number:   [id number]  Discharge Plan: SNF    Current Diagnoses: Patient Active Problem List   Diagnosis Date Noted  . Colonic mass 09/21/2016  . Diarrhea 09/21/2016  . Anemia 09/21/2016  . Sepsis ([place]) 08/21/2016  . Glucose intolerance (impaired glucose tolerance) 08/21/2016  . Oral thrush 08/21/2016  . Abnormal thyroid function test 08/21/2016  . Lobar pneumonia, unspecified organism ([place]) 08/18/2016  . Essential hypertension 08/18/2016  . Dehydration, mild 08/18/2016  . Hyponatremia 08/18/2016  . UTI (urinary tract infection) 08/18/2016  . FTT (failure to thrive) in adult 08/18/2016  . Frequent falls 08/18/2016    Orientation RESPIRATION BLADDER Height & Weight     Self, Time, Situation, Place  Normal Continent, External catheter Weight: 153 lb (69.4 kg) Height:  5\' 1"  (154.9 cm)  BEHAVIORAL SYMPTOMS/MOOD NEUROLOGICAL BOWEL NUTRITION STATUS   (none )  (none ) Continent Diet (Regular )  AMBULATORY STATUS COMMUNICATION OF NEEDS Skin   Extensive Assist Verbally Surgical wounds                       Personal Care Assistance Level of Assistance  Bathing, Feeding, Dressing Bathing Assistance: Limited assistance Feeding assistance: Limited assistance Dressing Assistance: Limited assistance     Functional Limitations Info  Speech, Hearing, Sight Sight Info: Adequate Hearing  Info: Adequate Speech Info: Adequate    SPECIAL CARE FACTORS FREQUENCY  PT (By licensed PT), OT (By licensed OT)     PT Frequency: 3 OT Frequency: 2             Contractures      Additional Factors Info  Code Status, Allergies Code Status Info: DNR CODE  Allergies Info: N/A           Current Medications (09/24/2016):  This is the current hospital active medication list Current Facility-Administered Medications  Medication Dose Route Frequency Provider Last Rate Last Dose  . 0.9 %  sodium chloride infusion   Intravenous Continuous Leighton Ruff, MD 75 mL/hr at 09/24/16 1523    . acetaminophen (TYLENOL) tablet 650 mg  650 mg Oral Q6H PRN Eugenie Filler, MD   650 mg at 09/23/16 2146   Or  . acetaminophen (TYLENOL) suppository 650 mg  650 mg Rectal Q6H PRN Eugenie Filler, MD      . cefTRIAXone (ROCEPHIN) 1 g in dextrose 5 % 50 mL IVPB  1 g Intravenous Q24H Eugenie Filler, MD   1 g at 09/22/16 1832  . Chlorhexidine Gluconate Cloth 2 % PADS 6 each  6 each Topical Once Jerrye Beavers, PA-C      . [START ON 09/25/2016] Chlorhexidine Gluconate Cloth 2 % PADS 6 each  6 each Topical Q0600 Omair Latif Sheikh, DO      . enoxaparin (LOVENOX) injection 40 mg  40 mg Subcutaneous Q24H Eugenie Filler, MD   [phone]  mg at 09/22/16 2141  . feeding supplement (BOOST / RESOURCE BREEZE) liquid 1 Container  1 Container Oral TID BM Colbert, DO      . feeding supplement (ENSURE ENLIVE) (ENSURE ENLIVE) liquid 237 mL  237 mL Oral TID BM Omair Latif Sheikh, DO      . fluconazole (DIFLUCAN) IVPB 100 mg  100 mg Intravenous Q24H Eugenie Filler, MD   100 mg at 09/22/16 1831  . fluticasone (FLONASE) 50 MCG/ACT nasal spray 1 spray  1 spray Each Nare Daily Eugenie Filler, MD   1 spray at 09/24/16 1440  . ketorolac (TORADOL) 15 MG/ML injection 15 mg  15 mg Intravenous Q6H PRN Eugenie Filler, MD   15 mg at 09/22/16 0244  . morphine 2 MG/ML injection 1-2 mg  1-2 mg Intravenous Q4H PRN  Eugenie Filler, MD   2 mg at 09/24/16 1442  . mupirocin ointment (BACTROBAN) 2 % 1 application  1 application Nasal BID Kerney Elbe, DO   1 application at [id number]  . nystatin (MYCOSTATIN) 100000 UNIT/ML suspension 500,000 Units  5 mL Oral QID Eugenie Filler, MD   500,000 Units at 09/24/16 1440  . nystatin cream (MYCOSTATIN) 1 application  1 application Topical BID Eugenie Filler, MD   1 application at [id number]  . ondansetron (ZOFRAN) tablet 4 mg  4 mg Oral Q6H PRN Eugenie Filler, MD       Or  . ondansetron [facility]) injection 4 mg  4 mg Intravenous Q6H PRN Eugenie Filler, MD   4 mg at 09/22/16 0522  . senna (SENOKOT) tablet 8.6 mg  1 tablet Oral BID Eugenie Filler, MD   8.6 mg at 09/22/16 2147  . simethicone (MYLICON) chewable tablet 80 mg  80 mg Oral QID PRN Eugenie Filler, MD   80 mg at 09/24/16 1523  . sodium phosphate (FLEET) 7-19 GM/118ML enema 1 enema  1 enema Rectal Once PRN Eugenie Filler, MD      . sorbitol 70 % solution 30 mL  30 mL Oral Daily PRN Eugenie Filler, MD      . traMADol Veatrice Bourbon) tablet 50 mg  50 mg Oral Q6H PRN Eugenie Filler, MD         Discharge Medications: Please see discharge summary for a list of discharge medications.  Relevant Imaging Results:  Relevant Lab Results:   Additional Information SSN [id number]  Glendon Axe A

## 2016-09-24 NOTE — Telephone Encounter (Signed)
Left a vm on the daughter's phone for her gto retunr my call.

## 2016-09-24 NOTE — Anesthesia Postprocedure Evaluation (Signed)
Anesthesia Post Note  Patient: Emily Mills  Procedure(s) Performed: Procedure(s) (LRB): LAPAROSCOPIC SPLENIC FLEXURE AND MOBILIZATION, CREATION OF LOOP COLOSTOMY (N/A)  Patient location during evaluation: PACU Anesthesia Type: General Level of consciousness: sedated Pain management: pain level controlled Vital Signs Assessment: post-procedure vital signs reviewed and stable Respiratory status: spontaneous breathing and respiratory function stable Cardiovascular status: stable Anesthetic complications: no       Last Vitals:  Vitals:   09/24/16 1330 09/24/16 1342  BP: 124/77 130/79  Pulse: 92   Resp: (!) 25 20  Temp:  36.9 C    Last Pain:  Vitals:   09/24/16 1024  TempSrc: Oral  PainSc:                  Kendarius Vigen DANIEL

## 2016-09-24 NOTE — Progress Notes (Signed)
Spoke to floor RN to follow up about blood culture result, still awaiting result, PICC still on hold as ordered.

## 2016-09-24 NOTE — Progress Notes (Signed)
PT Cancellation Note  Patient Details Name: Emily Mills MRN: [id number] DOB: February 24, 1931   Cancelled Treatment:    Reason Eval/Treat Not Completed: Patient at procedure or test/unavailable. Pt to have surgery today. Will await re-order and recs from surgeon.    Weston Anna, MPT Pager: [phone]

## 2016-09-24 NOTE — Progress Notes (Signed)
PROGRESS NOTE    Jalina Feliciano  [id number] DOB: 04-27-1931 DOA: 09/21/2016 PCP: London Pepper, MD   Brief Narrative:  83 year old female with hypertension, melanoma status post resection, GERD, and other comorbidities presented to the ED with worsening diarrhea, generalized weakness, lightheadedness and almost 40 pound weight loss for the past 6 months. Patient was admitted to the hospital in October for worsening symptoms when she was diagnosed with pneumonia and UTI and discharged to rehabilitation. She was then discharged to an assisted-living. GI saw her on 12/28 for ongoing diarrhea, performed an x-ray which was concerning for bowel obstruction. Colonoscopy was recommended but patient refused. Given significant worsening diarrhea and progressive weakness she was brought to the ED. CT abdomen and pelvis showed large heterogenous enhancing mass likely involving the sigmoid colon. Under went Flexible Sigmoidoscopy which revealed likely malignant completely obstructing tumor. General Surgery was consulted and patient to under go Diverting Loop Colostomy today.   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 While there are extensive diverticular of within the sigmoid colon, I feel this is most likely a colon mass/ tumor. This abuts the bladder. There is gas within the urinary bladder which is presumably from catheterization. If the patient has not been catheterized, this would be concerning for fistula to the adjacent abnormal sigmoid colon. -Pathology with invasive adenocarcinoma -CCS  consulting, prealbumin <5-nutritional status very poor -Diverting loop colostomy planned for later on today -C/w Simethicone 80 mg po  -Pain Control with Ketorolac 15 mg IV q6hprn Severe Pain, Morphine 1-2 mg IV q4hprn Severe Pain, and Zofran for N/V -WOC Nurse evaluated for preoperative stoma site -Continue to Monitor; Per General Surgery patient has Oncology Follow Up arranged   Possible UTI (urinary tract infection) -Urinalysis showed TNTC WBC, Large Leukocytes, Many Bacteria, and Negative Nitrite -Patient with Urinary Frequency and Incontinence -Urine Cx shows Multiple Species Present -On Empiric Rocephin Day 3   FTT (failure to thrive) in Adult/Severe Protein Calorie Malnutrition -PICC to be placed for possible TPN -May need TPN soon, Ensure Enlive Feeding Supplement as tolerated  Oral Thrush -C/w Nystatin 500,000 Units po 4 times Daily -On IV Diflucan 100 mg q24h.  Anemia-Iron deficiency -Likely from GI blood loss/colon CA -Patient's Hb/Hct went from 9.1/29.8 -> 9.7/31.3 -Continue to monitor, transfuse if <7 -Repeat CBC in AM  Diarrhea -Secondary to obstructing colon mass.  -On bowel regimen with Senna 8.6 mg po BID, Sorbitol 30 mL po PRN for Moderate Consitpation and IVF hydration with NS at 100 mL/hr. -Continue to Monitor  Questionable Left Lower Lobe Health Care Associated Pneumonia vs. Atelectasis -CXR 09/17/16 showed Essentially complete clearing of left upper lobe infiltrate compared to 1 month prior. No new opacity. Stable cardiac silhouette. There is aortic atherosclerosis. -Patient had CXR done 09/23/16 that Radiologist read as Patchy opacity at the left lung base, new since prior study concerning for pneumonia. Right lung is clear. Heart is mildly enlarged. Trace left pleural effusions suspected -On 08/20/16 Sputum Gram Stain and Cx showed Rare Gram Positive Cocci in Pairs and Cx showed Moderate Candida Albicans -CT Abd/Pelvis on 09/21/16 showed Dependent  atelectasis in the lung bases. Trace bilateral pleural effusions. -On Emperic Ceftriaxone and will continue for now -Repeat CXR in AM  Fever -Patient was febrile yesterday morning at 101.7 -Improved with Tylenol -Blood Cx x 2 Pending; Sputum Cx as above and Urine Cx as above -? Source of New PNA on CXR vs. Colonic Mass -Continue to Monitor Vital Signs  Lower Extremity/Pedal Edema -Per Daughter has been going on for months and comes and goes -Possibly related to Low Albumin (1.6) and poor Nutritional Status -Continue to Monitor and no Hx of Heart Failure; May Warrant an ECHOCardiogram  DVT prophylaxis: SCD's; Lovenox 40 mg sq on hold Code Status: DO NOT RESUSCITATE Family Communication: Discussed with Daughter at Bedside Disposition Plan: SNF when stable  Consultants:   [facility]  General Surgery   Procedures: Colonoscopy with Biopsy; Diverting loop colostomy to be done today  Antimicrobials: Empric Ceftriaxone, and Cefotetan IV for Surgical Prohlyaxis  Subjective: Seen and examined prior to surgery and stated she had no active complaints and no abdominal pain.   Objective: Vitals:   09/23/16 1441 09/23/16 2155 09/24/16 0010 09/24/16 0626  BP: 107/71 140/84  97/63  Pulse: (!) 107 (!) 101  (!) 115  Resp: 18 18  18   Temp: 98.2 F (36.8 C) 98.8 F (37.1 C)  98.2 F (36.8 C)  TempSrc: Oral Oral  Oral  SpO2: 94% 98% 94% 93%  Weight:      Height:        Intake/Output Summary (Last 24 hours) at 09/24/16 0837 Last data filed at 09/24/16 0212  Gross per 24 hour  Intake          1836.25 ml  Output                0 ml  Net          1836.25 ml   Filed Weights   09/22/16 1136  Weight: 69.4 kg (153 lb)   Examination: Physical Exam:  Constitutional: NAD and appears calm and comfortable Eyes: Lids and conjunctivae normal, sclerae anicteric  ENMT: External Ears, Nose appear normal. Slightly hard of hearing.  Neck: Appears normal, supple, no cervical  masses, normal ROM, no appreciable thyromegaly, no evidence of JVD Respiratory: Diminished to auscultation bilaterally, no wheezing, rales, rhonchi or crackles. Normal respiratory effort and patient is not tachypenic. No accessory muscle use.  Cardiovascular: RRR, no murmurs / rubs / gallops. S1 and S2 auscultated. 2+ Pitting Pedal extremity edema.  Abdomen: Soft, non-tender, mildly distended. No masses palpated. No appreciable hepatosplenomegaly. Bowel sounds positive x4.  GU: Deferred. Musculoskeletal: No clubbing / cyanosis of digits/nails. No joint deformity upper and lower extremities.   Skin: No rashes, lesions, ulcers. No induration; Warm and dry.  Neurologic: CN 2-12 grossly intact with no focal deficits. Sensation intact in all 4 Extremities. Romberg sign cerebellar reflexes not assessed.  Psychiatric: Normal judgment and insight. Alert and oriented x 3. Normal mood and appropriate affect.   Data Reviewed: I have personally reviewed following labs and imaging studies  CBC:  Recent Labs Lab 09/21/16 0905 09/22/16 0514 09/24/16 0503  WBC 7.0 5.8 8.8  HGB 10.1* 9.1* 9.7*  HCT 33.9* 29.8* 31.3*  MCV 79.0 80.8 79.6  PLT 278 285 [id number]   Basic Metabolic Panel:  Recent Labs Lab 09/21/16 0905 09/21/16 1713 09/22/16 0514 09/24/16 0503  NA 136  --  135 138  K 4.0  --  3.7 3.7  CL 99*  --  103 104  CO2  28  --  24 22  GLUCOSE 124*  --  106* 111*  BUN 13  --  11 12  CREATININE 0.35*  --  0.52 0.68  CALCIUM 7.9*  --  7.3* 7.7*  MG  --  2.1  --   --    GFR: Estimated Creatinine Clearance: 47.4 mL/min (by C-G formula based on SCr of 0.68 mg/dL). Liver Function Tests:  Recent Labs Lab 09/21/16 0905 09/22/16 0514  AST 28 25  ALT 13* 11*  ALKPHOS 159* 153*  BILITOT 0.4 0.6  PROT 6.0* 4.9*  ALBUMIN 1.9* 1.6*    Recent Labs Lab 09/21/16 0905  LIPASE 12   No results for input(s): AMMONIA in the last 168 hours. Coagulation Profile:  Recent Labs Lab 09/22/16 0514    INR 1.20   Cardiac Enzymes: No results for input(s): CKTOTAL, CKMB, CKMBINDEX, TROPONINI in the last 168 hours. BNP (last 3 results) No results for input(s): PROBNP in the last 8760 hours. HbA1C: No results for input(s): HGBA1C in the last 72 hours. CBG: No results for input(s): GLUCAP in the last 168 hours. Lipid Profile: No results for input(s): CHOL, HDL, LDLCALC, TRIG, CHOLHDL, LDLDIRECT in the last 72 hours. Thyroid Function Tests: No results for input(s): TSH, T4TOTAL, FREET4, T3FREE, THYROIDAB in the last 72 hours. Anemia Panel:  Recent Labs  09/21/16 1713  VITAMINB12 786  FOLATE 20.7  FERRITIN 128  TIBC 130*  IRON 6*   Sepsis Labs: No results for input(s): PROCALCITON, LATICACIDVEN in the last 168 hours.  Recent Results (from the past 240 hour(s))  Urine culture     Status: Abnormal   Collection Time: 09/21/16  2:19 PM  Result Value Ref Range Status   Specimen Description URINE, CATHETERIZED  Final   Special Requests NONE  Final   Culture MULTIPLE SPECIES PRESENT, SUGGEST RECOLLECTION (A)  Final   Report Status 09/22/2016 FINAL  Final    Radiology Studies: Dg Chest 2 View  Result Date: 09/23/2016 CLINICAL DATA:  Fever, weakness.  Recent pneumonia. EXAM: CHEST  2 VIEW COMPARISON:  09/17/2016 FINDINGS: Patchy opacity at the left lung base, new since prior study concerning for pneumonia. Right lung is clear. Heart is mildly enlarged. Trace left pleural effusions suspected. IMPRESSION: New left lower lobe opacity concerning for pneumonia. Trace left pleural effusion. Electronically Signed   By: Rolm Baptise M.D.   On: 09/23/2016 18:36   Scheduled Meds: . cefoTEtan (CEFOTAN) IV  2 g Intravenous On Call to OR  . cefTRIAXone (ROCEPHIN)  IV  1 g Intravenous Q24H  . Chlorhexidine Gluconate Cloth  6 each Topical Once  . enoxaparin (LOVENOX) injection  40 mg Subcutaneous Q24H  . feeding supplement (ENSURE ENLIVE)  237 mL Oral TID BM  . fluconazole (DIFLUCAN) IV  100 mg  Intravenous Q24H  . fluticasone  1 spray Each Nare Daily  . nystatin  5 mL Oral QID  . nystatin cream  1 application Topical BID  . senna  1 tablet Oral BID   Continuous Infusions: . sodium chloride 100 mL/hr at 09/24/16 0500    LOS: 2 days   Kerney Elbe, DO [facility] Pager [phone]  If 7PM-7AM, please contact night-coverage [email or web address] Password [facility] 09/24/2016, 8:37 AM

## 2016-09-24 NOTE — Transfer of Care (Signed)
Immediate Anesthesia Transfer of Care Note  Patient: Emily Mills  Procedure(s) Performed: Procedure(s): LAPAROSCOPIC SPLENIC FLEXURE AND MOBILIZATION, CREATION OF LOOP COLOSTOMY (N/A)  Patient Location: PACU  Anesthesia Type:General  Level of Consciousness: awake, alert  and oriented  Airway & Oxygen Therapy: Patient Spontanous Breathing and Patient connected to face mask oxygen  Post-op Assessment: Report given to RN and Post -op Vital signs reviewed and stable  Post vital signs: Reviewed and stable  Last Vitals:  Vitals:   09/24/16 0626 09/24/16 1024  BP: 97/63 108/68  Pulse: (!) 115 91  Resp: 18 18  Temp: 36.8 C 37.1 C    Last Pain:  Vitals:   09/24/16 1024  TempSrc: Oral  PainSc:       Patients Stated Pain Goal: 4 ([id number])  Complications: No apparent anesthesia complications

## 2016-09-24 NOTE — Progress Notes (Signed)
Colonic mass  Subjective: Pt had a fever yesterday.  Unclear etiology  Objective: Vital signs in last 24 hours: Temp:  [98.2 F (36.8 C)-100.4 F (38 C)] 98.2 F (36.8 C) (01/05 0626) Pulse Rate:  [101-115] 115 (01/05 0626) Resp:  [18] 18 (01/05 0626) BP: (97-140)/(63-84) 97/63 (01/05 0626) SpO2:  [93 %-98 %] 93 % (01/05 0626) Last BM Date: 09/23/16 (loose)  Intake/Output from previous day: 01/04 0701 - 01/05 0700 In: 1836.3 [P.O.:480; I.V.:1256.3; IV Piggyback:100] Out: -  Intake/Output this shift: No intake/output data recorded.  General appearance: alert and cooperative GI: normal findings: soft, non-tender  Lab Results:  Results for orders placed or performed during the hospital encounter of 09/21/16 (from the past 24 hour(s))  CBC     Status: Abnormal   Collection Time: 09/24/16  5:03 AM  Result Value Ref Range   WBC 8.8 4.0 - 10.5 K/uL   RBC 3.93 3.87 - 5.11 MIL/uL   Hemoglobin 9.7 (L) 12.0 - 15.0 g/dL   HCT 31.3 (L) 36.0 - 46.0 %   MCV 79.6 78.0 - 100.0 fL   MCH 24.7 (L) 26.0 - 34.0 pg   MCHC 31.0 30.0 - 36.0 g/dL   RDW 15.7 (H) 11.5 - 15.5 %   Platelets 266 150 - 400 K/uL  Basic metabolic panel     Status: Abnormal   Collection Time: 09/24/16  5:03 AM  Result Value Ref Range   Sodium 138 135 - 145 mmol/L   Potassium 3.7 3.5 - 5.1 mmol/L   Chloride 104 101 - 111 mmol/L   CO2 22 22 - 32 mmol/L   Glucose, Bld 111 (H) 65 - 99 mg/dL   BUN 12 6 - 20 mg/dL   Creatinine, Ser 0.68 0.44 - 1.00 mg/dL   Calcium 7.7 (L) 8.9 - 10.3 mg/dL   GFR calc non Af Amer >60 >60 mL/min   GFR calc Af Amer >60 >60 mL/min   Anion gap 12 5 - 15     Studies/Results Radiology     MEDS, Scheduled . cefoTEtan (CEFOTAN) IV  2 g Intravenous On Call to OR  . cefTRIAXone (ROCEPHIN)  IV  1 g Intravenous Q24H  . Chlorhexidine Gluconate Cloth  6 each Topical Once  . enoxaparin (LOVENOX) injection  40 mg Subcutaneous Q24H  . feeding supplement (ENSURE ENLIVE)  237 mL Oral TID BM   . fluconazole (DIFLUCAN) IV  100 mg Intravenous Q24H  . fluticasone  1 spray Each Nare Daily  . nystatin  5 mL Oral QID  . nystatin cream  1 application Topical BID  . senna  1 tablet Oral BID     Assessment: Colonic mass   Plan: Path c/w adenoca.  Will plan on diverting loop colostomy and then possible definitive surgery later once nutrition improves.  Pt has oncology f/u arranged.  Risks of surgery discussed with pt.  These include bleeding, infection, damage to adjacent structures and need for further surgery.  I believe she understands this and has agreed to proceed.      LOS: 2 days    Rosario Adie, [facility], [place]   09/24/2016 9:42 AM

## 2016-09-24 NOTE — Clinical Social Work Placement (Signed)
   CLINICAL SOCIAL WORK PLACEMENT  NOTE  Date:  09/24/2016  Patient Details  Name: Emily Mills MRN: [id number] Date of Birth: 1931-09-14  Clinical Social Work is seeking post-discharge placement for this patient at the [place] level of care (*CSW will initial, date and re-position this form in  chart as items are completed):  Yes   Patient/family provided with [facility] Work Department's list of facilities offering this level of care within the geographic area requested by the patient (or if unable, by the patient's family).  Yes   Patient/family informed of their freedom to choose among providers that offer the needed level of care, that participate in Medicare, Medicaid or managed care program needed by the patient, have an available bed and are willing to accept the patient.  Yes   Patient/family informed of [facility]'s ownership interest in [facility] and [facility], as well as of the fact that they are under no obligation to receive care at these facilities.  PASRR submitted to EDS on 09/24/16     PASRR number received on 09/24/16     Existing PASRR number confirmed on       FL2 transmitted to all facilities in geographic area requested by pt/family on 09/24/16     FL2 transmitted to all facilities within larger geographic area on       Patient informed that his/her managed care company has contracts with or will negotiate with certain facilities, including the following:            Patient/family informed of bed offers received.  Patient chooses bed at       Physician recommends and patient chooses bed at      Patient to be transferred to   on  .  Patient to be transferred to facility by       Patient family notified on   of transfer.  Name of family member notified:        PHYSICIAN Please sign FL2     Additional Comment:    _______________________________________________ Glendon Axe A 09/24/2016, 4:14  PM

## 2016-09-24 NOTE — Op Note (Signed)
09/21/2016 - 09/24/2016  12:38 PM  PATIENT:  Emily Mills  83 y.o. female  Patient Care Team: London Pepper, MD as PCP - General (Family Medicine)  PRE-OPERATIVE DIAGNOSIS:  obstructing colon cancer  POST-OPERATIVE DIAGNOSIS:  obstructing colon cancer  PROCEDURE:  Procedure(s): LAPAROSCOPIC SPLENIC FLEXURE AND MOBILIZATION, CREATION OF LOOP COLOSTOMY  SURGEON:  Surgeon(s): Leighton Ruff, MD  ASSISTANT: none   ANESTHESIA:   local and general  EBL:  Total I/O In: 500 [IV Piggyback:500] Out: -   DRAINS: none   SPECIMEN:  No Specimen  DISPOSITION OF SPECIMEN:  N/A  COUNTS:  YES  PLAN OF CARE: Pt admitted  PATIENT DISPOSITION:  PACU - hemodynamically stable.  INDICATION: 83 year old female with severe malnutrition and sigmoid colon cancer, obstructing.   OR FINDINGS: Sigmoid colon cancer affixed to the left pelvic sidewall. No other signs of metastatic disease noted.  DESCRIPTION: the patient was identified in the preoperative holding area and taken to the OR where they were laid supine on the operating room table.  General anesthesia was induced without difficulty. SCDs were also noted to be in place prior to the initiation of anesthesia.  The patient was then prepped and draped in the usual sterile fashion.   A surgical timeout was performed indicating the correct patient, procedure, positioning and need for preoperative antibiotics.   I began by making a infraumbilical incision using a 15 blade scalpel. This carried down through subcutaneous tissue bluntly until fascia was identified and elevated using 2 Kocher clamps. The fascia was divided and the peritoneum was entered bluntly. Then placed a 0 Vicryl stay suture in a pursestring fashion and a Hassan port. The abdomen was insufflated without difficulty. I evaluated the abdomen in all 4 quadrants. There was a small splenic infarct noted. There was no signs of metastatic disease in the liver. The small bowel was mobile with  no signs of peritoneal metastases. There was a large mass in the left lower quadrant affixed to the left side of the pelvic wall. I began by mobilizing the white line of Toldt up to the splenic flexure. I was unable to get the colon completely mobilized with this. Her splenic flexure was very loose and therefore decided to take down the splenic fracture rather than give her a transverse colostomy. This was done using a laparoscopic Enseal device. After the splenic flexure was taken down the colon easily mobilized to the previously marked ostomy site. I made an incision in the skin and carried this down through subcutaneous tissue. A cruciate incision was made in the fascia. The rectus muscle was split and the peritoneum was entered. The colon was brought up through this and secured with a red rubber catheter in a loop fashion. I reinsufflated the abdomen and evaluated the colon. It was sitting straight in the abdomen and there was no signs of bleeding. The abdomen was desufflated. The pursestring suture was closed at the umbilicus. A 2-0 Vicryl suture was placed in the subcutaneous tissue and a 4-0 Vicryl suture was used to close the port sites. Dermabond was used on these. I then matured the loop colostomy in standard [place] fashion using interrupted 2-0 Vicryl sutures. An ostomy appliance was placed. The patient was awakened from anesthesia and sent to the postanesthesia care unit in stable condition. All counts were correct per operating room staff.

## 2016-09-25 LAB — CBC WITH DIFFERENTIAL/PLATELET
Basophils Absolute: 0 10*3/uL (ref 0.0–0.1)
Basophils Relative: 0 %
EOS ABS: 0 10*3/uL (ref 0.0–0.7)
Eosinophils Relative: 0 %
HCT: 30.4 % — ABNORMAL LOW (ref 36.0–46.0)
Hemoglobin: 9.4 g/dL — ABNORMAL LOW (ref 12.0–15.0)
Lymphocytes Relative: 20 %
Lymphs Abs: 1.1 10*3/uL (ref 0.7–4.0)
MCH: 24.8 pg — ABNORMAL LOW (ref 26.0–34.0)
MCHC: 30.9 g/dL (ref 30.0–36.0)
MCV: 80.2 fL (ref 78.0–100.0)
MONO ABS: 0.4 10*3/uL (ref 0.1–1.0)
MONOS PCT: 8 %
Neutro Abs: 3.9 10*3/uL (ref 1.7–7.7)
Neutrophils Relative %: 72 %
Platelets: 242 10*3/uL (ref 150–400)
RBC: 3.79 MIL/uL — ABNORMAL LOW (ref 3.87–5.11)
RDW: 15.9 % — AB (ref 11.5–15.5)
WBC: 5.4 10*3/uL (ref 4.0–10.5)

## 2016-09-25 LAB — COMPREHENSIVE METABOLIC PANEL
ALT: 14 U/L (ref 14–54)
ANION GAP: 6 (ref 5–15)
AST: 31 U/L (ref 15–41)
Albumin: 1.8 g/dL — ABNORMAL LOW (ref 3.5–5.0)
Alkaline Phosphatase: 172 U/L — ABNORMAL HIGH (ref 38–126)
BUN: 14 mg/dL (ref 6–20)
CHLORIDE: 104 mmol/L (ref 101–111)
CO2: 25 mmol/L (ref 22–32)
Calcium: 7.7 mg/dL — ABNORMAL LOW (ref 8.9–10.3)
Creatinine, Ser: 0.46 mg/dL (ref 0.44–1.00)
GFR calc Af Amer: >60 mL/min (ref >60)
GFR calc non Af Amer: >60 mL/min (ref >60)
GLUCOSE: 111 mg/dL — AB (ref 65–99)
POTASSIUM: 3.4 mmol/L — AB (ref 3.5–5.1)
SODIUM: 135 mmol/L (ref 135–145)
Total Bilirubin: 0.3 mg/dL (ref 0.3–1.2)
Total Protein: 5 g/dL — ABNORMAL LOW (ref 6.5–8.1)

## 2016-09-25 LAB — MAGNESIUM: Magnesium: 1.8 mg/dL (ref 1.7–2.4)

## 2016-09-25 LAB — PHOSPHORUS: Phosphorus: 2.4 mg/dL — ABNORMAL LOW (ref 2.5–4.6)

## 2016-09-25 MED ORDER — POTASSIUM CHLORIDE CRYS ER 20 MEQ PO TBCR
40.0000 meq | EXTENDED_RELEASE_TABLET | Freq: Once | ORAL | Status: AC
  Administered 2016-09-25: 40 meq via ORAL
  Filled 2016-09-25: qty 2

## 2016-09-25 NOTE — Progress Notes (Signed)
  General Surgery [facility]  Assessment & Plan:  Status post lap loop colostomy for obstructing sigmoid carcinoma - POD#1  Clear liquid diet  Pain well controlled  Per medical service  Consult to [place] for stoma care/education        Earnstine Regal, MD, [facility]       Office: [phone]    Subjective: Patient in bed, comfortable, no complaints.  Ordering liquid breakfast.  Objective: Vital signs in last 24 hours: Temp:  [97.4 F (36.3 C)-98.7 F (37.1 C)] 98.1 F (36.7 C) (01/06 0655) Pulse Rate:  [88-109] 109 (01/06 0655) Resp:  [16-31] 18 (01/06 0655) BP: (108-140)/(68-95) 140/95 (01/06 0655) SpO2:  [94 %-100 %] 95 % (01/06 0655) Last BM Date: 09/23/16 (loose)  Intake/Output from previous day: 01/05 0701 - 01/06 0700 In: 2956.3 [P.O.:360; I.V.:2096.3; IV Piggyback:500] Out: 25 [Blood:25] Intake/Output this shift: No intake/output data recorded.  Physical Exam: HEENT - sclerae clear, mucous membranes moist Neck - soft Abdomen - soft, obese; stoma viable LLQ with red rubber Qu[place] bridge in place; no output  Lab Results:   Recent Labs  09/24/16 0503  WBC 8.8  HGB 9.7*  HCT 31.3*  PLT 266   BMET  Recent Labs  09/24/16 0503  NA 138  K 3.7  CL 104  CO2 22  GLUCOSE 111*  BUN 12  CREATININE 0.68  CALCIUM 7.7*   PT/INR No results for input(s): LABPROT, INR in the last 72 hours. Comprehensive Metabolic Panel:    Component Value Date/Time   NA 138 09/24/2016 0503   NA 135 09/22/2016 0514   NA 139 08/24/2016   K 3.7 09/24/2016 0503   K 3.7 09/22/2016 0514   CL 104 09/24/2016 0503   CL 103 09/22/2016 0514   CO2 22 09/24/2016 0503   CO2 24 09/22/2016 0514   BUN 12 09/24/2016 0503   BUN 11 09/22/2016 0514   BUN 7 08/24/2016   CREATININE 0.68 09/24/2016 0503   CREATININE 0.52 09/22/2016 0514   GLUCOSE 111 (H) 09/24/2016 0503   GLUCOSE 106 (H) 09/22/2016 0514   CALCIUM 7.7 (L)  09/24/2016 0503   CALCIUM 7.3 (L) 09/22/2016 0514   AST 25 09/22/2016 0514   AST 28 09/21/2016 0905   ALT 11 (L) 09/22/2016 0514   ALT 13 (L) 09/21/2016 0905   ALKPHOS 153 (H) 09/22/2016 0514   ALKPHOS 159 (H) 09/21/2016 0905   BILITOT 0.6 09/22/2016 0514   BILITOT 0.4 09/21/2016 0905   PROT 4.9 (L) 09/22/2016 0514   PROT 6.0 (L) 09/21/2016 0905   ALBUMIN 1.6 (L) 09/22/2016 0514   ALBUMIN 1.9 (L) 09/21/2016 0905    Studies/Results: Dg Chest 2 View  Result Date: 09/23/2016 CLINICAL DATA:  Fever, weakness.  Recent pneumonia. EXAM: CHEST  2 VIEW COMPARISON:  09/17/2016 FINDINGS: Patchy opacity at the left lung base, new since prior study concerning for pneumonia. Right lung is clear. Heart is mildly enlarged. Trace left pleural effusions suspected. IMPRESSION: New left lower lobe opacity concerning for pneumonia. Trace left pleural effusion. Electronically Signed   By: Rolm Baptise M.D.   On: 09/23/2016 18:36      Doni Bacha M 09/25/2016  Patient ID: Emily Mills, female   DOB: 27-Aug-1931, 83 y.o.   MRN: [id number]

## 2016-09-25 NOTE — Progress Notes (Signed)
Physical Therapy Treatment Patient Details Name: Emily Mills MRN: [id number] DOB: 1931-04-15 Today's Date: 09/25/2016    History of Present Illness HPI: Emily Mills is a 83 y.o. female with medical history significant of HTN, prior hx of melanoma s/p resection, GERD, presents to the ED with complaints of worsening diarrhea, worsening generalized weakness, lightheadedness and a 40 pound weight loss over the past 6 months.; pt was recentlly admitted with frequent falls. 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    PT Comments    Pt pleasant and agreeable to work with PT today, however only wanted to do things EOB. Did perform standing at EOB, exercises and side stepping. Feel patient will be able to take steps and ambulate next time.   Follow Up Recommendations  SNF (assuming plan is the same, was nto able to talk with family or pateint about this today. )     Equipment Recommendations  None recommended by PT    Recommendations for Other Services       Precautions / Restrictions Precautions Precautions: Fall Restrictions Weight Bearing Restrictions: No    Mobility  Bed Mobility Overal bed mobility: Needs Assistance Bed Mobility: Supine to Sit Rolling: Mod assist   Supine to sit: Mod assist     General bed mobility comments: educated and assessed patient to try to roll to perform sidelying to sit to minimize strain on adomen where colostomy was just placed.   Transfers Overall transfer level: Needs assistance Equipment used: Rolling walker (2 wheeled) Transfers: Sit to/from Stand Sit to Stand: Min assist         General transfer comment: pt reallly required 1 person assist but fearful adn wanted 2 person initially. Tolerated standing very well and side stepped to head of bed before sitting down .  Ambulation/Gait Ambulation/Gait assistance:  (just side stepping 4 steps to head of bed with RW  feel she will ambulate next time.  )               Stairs            Wheelchair Mobility    Modified Rankin (Stroke Patients Only)       Balance Overall balance assessment: History of Falls                                  Cognition Arousal/Alertness: Awake/alert Behavior During Therapy: WFL for tasks assessed/performed Overall Cognitive Status: Within Functional Limits for tasks assessed                      Exercises Other Exercises Other Exercises: seated at [facility] of bed with abkle pumps and knee kicks B 10 x each     General Comments General comments (skin integrity, edema, etc.): sitting balancve with fee supported supervision , and standing at RW waas min assit. Pt more fearful.       Pertinent Vitals/Pain Pain Assessment:  (in my stomach area. ) Pain Score: 2  Pain Location: stomach (pointing to colostomy area) Pain Descriptors / Indicators: Aching Pain Intervention(s): Limited activity within patient's tolerance    Home Living                      Prior Function            PT Goals (current goals can now be found in the care plan section) Acute Rehab PT  Goals Patient Stated Goal: none stated "I don't know" PT Goal Formulation: With patient Time For Goal Achievement: 10/06/16 Potential to Achieve Goals: Good    Frequency    Min 3X/week      PT Plan      Co-evaluation             End of Session Equipment Utilized During Treatment: Gait belt Activity Tolerance: Patient tolerated treatment well (very pleasant today, daughter present and agreed to some gentle therapy , seems more fearful than inability. ) Patient left: in bed;with bed alarm set;with call bell/phone within reach;with family/visitor present     Time: [id number] PT Time Calculation (min) (ACUTE ONLY): 24 min  Charges:  $Therapeutic Activity: 23-37 mins                    G CodesClide Dales September 27, 2016, 3:44 PM Clide Dales, PT Pager:  [phone] 09/27/16

## 2016-09-25 NOTE — Progress Notes (Signed)
PROGRESS NOTE    Emily Mills  [id number] DOB: 08/01/1931 DOA: 09/21/2016 PCP: London Pepper, MD   Brief Narrative:  83 year old female with hypertension, melanoma status post resection, GERD, and other comorbidities presented to the ED with worsening diarrhea, generalized weakness, lightheadedness and almost 40 pound weight loss for the past 6 months. Patient was admitted to the hospital in October for worsening symptoms when she was diagnosed with pneumonia and UTI and discharged to rehabilitation. She was then discharged to an assisted-living. GI saw her on 12/28 for ongoing diarrhea, performed an x-ray which was concerning for bowel obstruction. Colonoscopy was recommended but patient refused. Given significant worsening diarrhea and progressive weakness she was brought to the ED. CT abdomen and pelvis showed large heterogenous enhancing mass likely involving the sigmoid colon. Under went Flexible Sigmoidoscopy which revealed likely malignant completely obstructing tumor. General Surgery was consulted and patient to underwent Diverting Loop Colostomy on 09/24/16.   Assessment & Plan:   Principal Problem:   Colonic mass Active Problems:   Essential hypertension   Dehydration, mild   UTI (urinary tract infection)   FTT (failure to thrive) in adult   Oral thrush   Diarrhea   Anemia  Colonic Mass consistent with Adenocarcinoma with obstruction s/p Diverting Loop Colostomy POD1 -Complete obstructing tumor in the mid sigmoid colon on Sigmoidoscopy -CT Abd/Pelvis showed Large heterogeneous enhancing mass like area involving the sigmoid colon. While there are extensive diverticular of within the sigmoid colon, I feel this is most likely a colon mass/ tumor. This abuts the bladder. There is gas within the urinary bladder which is presumably from catheterization. If the patient has not been catheterized, this would be concerning for fistula to the adjacent abnormal sigmoid colon. -Pathology  with invasive adenocarcinoma -CCS consulting, prealbumin <5-nutritional status very poor -Diverting loop colostomy done yesterday -C/w Simethicone 80 mg po  -Clear Liquid Diet per General Surgery -Pain Control with Ketorolac 15 mg IV q6hprn Severe Pain, Morphine 1-2 mg IV q4hprn Severe Pain, Tramadol 50 mg po q6hprn for Moderate Pain and Zofran for N/V -WOC Nurse consulted for new loop colostomy -Continue to Monitor; Per General Surgery patient has Oncology Follow Up arranged   Possible UTI (urinary tract infection) -Urinalysis showed TNTC WBC, Large Leukocytes, Many Bacteria, and Negative Nitrite -Patient with Urinary Frequency and Incontinence -Urine Cx shows Multiple Species Present -On Empiric Rocephin Day 4   FTT (failure to thrive) in Adult/Severe Protein Calorie Malnutrition -Will hold on PICC as patient is tolerating Diet ant Feeding Supplements with Boost and Ensure; Will Discuss with General Surgery  Oral Thrush -C/w Nystatin 500,000 Units po 4 times Daily -On IV Diflucan 100 mg q24h.  Anemia-Iron deficiency -Likely from GI blood loss/colon CA -Patient's Hb/Hct went from 9.1/29.8 -> 9.7/31.3 -> 9.4/30.4 -Continue to monitor, transfuse if <7 -Repeat CBC in AM  Hypokalemia -Patient's K+ was 3.4 -Replete -Repeat CMP in AM  Diarrhea; improved once Diverting Loop Colostomy was made -Colostomy had fluid in Bag -Was Secondary to obstructing colon mass.  -On bowel regimen with Senna 8.6 mg po BID, Sorbitol 30 mL po PRN for Moderate Consitpation and IVF hydration with NS at 87mL/hr. -Continue to Monitor  Questionable Left Lower Lobe Health Care Associated Pneumonia vs. Atelectasis -CXR 09/17/16 showed Essentially complete clearing of left upper lobe infiltrate compared to 1 month prior. No new opacity. Stable cardiac silhouette. There is aortic atherosclerosis. -Patient had CXR done 09/23/16 that Radiologist read as Patchy opacity at the left lung base, new since  prior  study concerning for pneumonia. Right lung is clear. Heart is mildly enlarged. Trace left pleural effusions suspected -On 08/20/16 Sputum Gram Stain and Cx showed Rare Gram Positive Cocci in Pairs and Cx showed Moderate Candida Albicans -CT Abd/Pelvis on 09/21/16 showed Dependent atelectasis in the lung bases. Trace bilateral pleural effusions. -On Emperic Ceftriaxone and will continue for now -WBC went from 8.8 -> 5.4; Afebrile and not Short of Breath -C/w Incentive Spirometer -Repeat CXR in AM  Fever, Resolved -Patient was febrile the morning of 09/23/16 at 101.7 -Improved with Tylenol -Blood Cx x 2 Showed NGTD at 2 days; Sputum Cx as above and Urine Cx as above -? Source of New PNA on CXR vs. Colonic Mass -Continue to Monitor Vital Signs  Lower Extremity/Pedal Edema -Per Daughter has been going on for months and comes and goes -Possibly related to Low Albumin (1.8) and poor Nutritional Status -Continue to Monitor and no Hx of Heart Failure; May Warrant an ECHOCardiogram but will hold off for now  DVT prophylaxis: SCD's; Lovenox 40 mg sq  Code Status: DO NOT RESUSCITATE Family Communication: Discussed with Daughter at Bedside Disposition Plan: SNF when stable  Consultants:   [facility]  General Surgery   Procedures: Colonoscopy with Biopsy; Diverting loop colostomy    Antimicrobials: Empric Ceftriaxone, and Cefotetan IV for Surgical Prohlyaxis  Subjective: Seen and examined at bedside and states she is doing well after surgery. No N/V and is tolerating clear liquids well. Ate all her jello. No Abdominal Pain. No other concerns or complaints.   Objective: Vitals:   09/24/16 2238 09/25/16 0154 09/25/16 0655 09/25/16 1400  BP: (!) 123/93 129/79 (!) 140/95 120/80  Pulse: (!) 102 (!) 102 (!) 109 97  Resp: 16 16 18 18   Temp: 98.2 F (36.8 C) 97.9 F (36.6 C) 98.1 F (36.7 C) 97.9 F (36.6 C)  TempSrc: Oral Axillary Oral Oral  SpO2: 95% 94% 95% 94%  Weight:        Height:        Intake/Output Summary (Last 24 hours) at 09/25/16 1720 Last data filed at 09/25/16 1700  Gross per 24 hour  Intake          2381.25 ml  Output              100 ml  Net          2281.25 ml   Filed Weights   09/22/16 1136  Weight: 69.4 kg (153 lb)   Examination: Physical Exam:  Constitutional: NAD and appears calm and comfortable Eyes: Lids and conjunctivae normal, sclerae anicteric  ENMT: External Ears, Nose appear normal. Slightly hard of hearing.  Neck: Appears normal, supple, no cervical masses, normal ROM, no appreciable thyromegaly, no evidence of JVD Respiratory: Diminished to auscultation bilaterally, no wheezing, rales, rhonchi or crackles. Normal respiratory effort and patient is not tachypenic. No accessory muscle use.  Cardiovascular: RRR, no murmurs / rubs / gallops. S1 and S2 auscultated. 2+ Pitting Pedal extremity edema.  Abdomen: Soft, non-tender, mildly distended. No masses palpated. No appreciable hepatosplenomegaly. Bowel sounds positive x4. Diverting Loop Colostomy in place with some liquid in bag and no blood around stoma site.  GU: Deferred. Musculoskeletal: No clubbing / cyanosis of digits/nails. No joint deformity upper and lower extremities.   Skin: No rashes, lesions, ulcers. No induration; Warm and dry.  Neurologic: CN 2-12 grossly intact with no focal deficits. Sensation intact in all 4 Extremities. Romberg sign cerebellar reflexes not assessed.  Psychiatric: Normal  judgment and insight. Alert and oriented x 3. Normal mood and appropriate affect.   Data Reviewed: I have personally reviewed following labs and imaging studies  CBC:  Recent Labs Lab 09/21/16 0905 09/22/16 0514 09/24/16 0503 09/25/16 0915  WBC 7.0 5.8 8.8 5.4  NEUTROABS  --   --   --  3.9  HGB 10.1* 9.1* 9.7* 9.4*  HCT 33.9* 29.8* 31.3* 30.4*  MCV 79.0 80.8 79.6 80.2  PLT 278 285 266 [id number]   Basic Metabolic Panel:  Recent Labs Lab 09/21/16 0905 09/21/16 1713  09/22/16 0514 09/24/16 0503 09/25/16 0915  NA 136  --  135 138 135  K 4.0  --  3.7 3.7 3.4*  CL 99*  --  103 104 104  CO2 28  --  24 22 25   GLUCOSE 124*  --  106* 111* 111*  BUN 13  --  11 12 14   CREATININE 0.35*  --  0.52 0.68 0.46  CALCIUM 7.9*  --  7.3* 7.7* 7.7*  MG  --  2.1  --   --  1.8  PHOS  --   --   --   --  2.4*   GFR: Estimated Creatinine Clearance: 47.4 mL/min (by C-G formula based on SCr of 0.46 mg/dL). Liver Function Tests:  Recent Labs Lab 09/21/16 0905 09/22/16 0514 09/25/16 0915  AST 28 25 31   ALT 13* 11* 14  ALKPHOS 159* 153* 172*  BILITOT 0.4 0.6 0.3  PROT 6.0* 4.9* 5.0*  ALBUMIN 1.9* 1.6* 1.8*    Recent Labs Lab 09/21/16 0905  LIPASE 12   No results for input(s): AMMONIA in the last 168 hours. Coagulation Profile:  Recent Labs Lab 09/22/16 0514  INR 1.20   Cardiac Enzymes: No results for input(s): CKTOTAL, CKMB, CKMBINDEX, TROPONINI in the last 168 hours. BNP (last 3 results) No results for input(s): PROBNP in the last 8760 hours. HbA1C: No results for input(s): HGBA1C in the last 72 hours. CBG: No results for input(s): GLUCAP in the last 168 hours. Lipid Profile: No results for input(s): CHOL, HDL, LDLCALC, TRIG, CHOLHDL, LDLDIRECT in the last 72 hours. Thyroid Function Tests: No results for input(s): TSH, T4TOTAL, FREET4, T3FREE, THYROIDAB in the last 72 hours. Anemia Panel: No results for input(s): VITAMINB12, FOLATE, FERRITIN, TIBC, IRON, RETICCTPCT in the last 72 hours. Sepsis Labs: No results for input(s): PROCALCITON, LATICACIDVEN in the last 168 hours.  Recent Results (from the past 240 hour(s))  Urine culture     Status: Abnormal   Collection Time: 09/21/16  2:19 PM  Result Value Ref Range Status   Specimen Description URINE, CATHETERIZED  Final   Special Requests NONE  Final   Culture MULTIPLE SPECIES PRESENT, SUGGEST RECOLLECTION (A)  Final   Report Status 09/22/2016 FINAL  Final  Culture, blood (routine x 2)      Status: None (Preliminary result)   Collection Time: 09/23/16  9:50 AM  Result Value Ref Range Status   Specimen Description BLOOD LEFT ARM  Final   Special Requests BOTTLES DRAWN AEROBIC AND ANAEROBIC 5CC  Final   Culture   Final    NO GROWTH 2 DAYS Performed at [facility]    Report Status PENDING  Incomplete  Culture, blood (routine x 2)     Status: None (Preliminary result)   Collection Time: 09/23/16  9:54 AM  Result Value Ref Range Status   Specimen Description BLOOD LEFT HAND  Final   Special Requests IN PEDIATRIC BOTTLE 3CC  Final   Culture   Final    NO GROWTH 2 DAYS Performed at [facility]    Report Status PENDING  Incomplete  Surgical pcr screen     Status: Abnormal   Collection Time: 09/24/16  7:24 AM  Result Value Ref Range Status   MRSA, PCR POSITIVE (A) NEGATIVE Final    Comment: RESULT CALLED TO, READ BACK BY AND VERIFIED WITH: HERRSCHAFT,M @ 1301 ON [id number] BY POTEAT,S    Staphylococcus aureus POSITIVE (A) NEGATIVE Final    Comment:        The Xpert SA Assay (FDA approved for NASAL specimens in patients over 3 years of age), is one component of a comprehensive surveillance program.  Test performance has been validated by [facility] for patients greater than or equal to 39 year old. It is not intended to diagnose infection nor to guide or monitor treatment.     Radiology Studies: Dg Chest 2 View  Result Date: 09/23/2016 CLINICAL DATA:  Fever, weakness.  Recent pneumonia. EXAM: CHEST  2 VIEW COMPARISON:  09/17/2016 FINDINGS: Patchy opacity at the left lung base, new since prior study concerning for pneumonia. Right lung is clear. Heart is mildly enlarged. Trace left pleural effusions suspected. IMPRESSION: New left lower lobe opacity concerning for pneumonia. Trace left pleural effusion. Electronically Signed   By: Rolm Baptise M.D.   On: 09/23/2016 18:36   Scheduled Meds: . cefTRIAXone (ROCEPHIN)  IV  1 g Intravenous Q24H  .  Chlorhexidine Gluconate Cloth  6 each Topical Once  . Chlorhexidine Gluconate Cloth  6 each Topical Q0600  . enoxaparin (LOVENOX) injection  40 mg Subcutaneous Q24H  . feeding supplement  1 Container Oral TID BM  . feeding supplement (ENSURE ENLIVE)  237 mL Oral TID BM  . fluconazole (DIFLUCAN) IV  100 mg Intravenous Q24H  . fluticasone  1 spray Each Nare Daily  . mupirocin ointment  1 application Nasal BID  . nystatin  5 mL Oral QID  . nystatin cream  1 application Topical BID  . senna  1 tablet Oral BID   Continuous Infusions: . sodium chloride 75 mL/hr at 09/25/16 0629    LOS: 3 days   Kerney Elbe, DO [facility] Pager [phone]  If 7PM-7AM, please contact night-coverage [email or web address] Password [email or web address] 09/25/2016, 5:20 PM

## 2016-09-25 NOTE — Progress Notes (Signed)
[place] RN spoke with Dr Alfredia Ferguson regarding PICC line. States to wait on PICC for now since taking po's and has appetite.  Please reorder PICC if determined to be needed.  Thank you for the referral.

## 2016-09-26 LAB — CBC WITH DIFFERENTIAL/PLATELET
BASOS PCT: 0 %
Basophils Absolute: 0 10*3/uL (ref 0.0–0.1)
Eosinophils Absolute: 0 10*3/uL (ref 0.0–0.7)
Eosinophils Relative: 1 %
HEMATOCRIT: 29.1 % — AB (ref 36.0–46.0)
Hemoglobin: 9.1 g/dL — ABNORMAL LOW (ref 12.0–15.0)
LYMPHS PCT: 26 %
Lymphs Abs: 1.3 10*3/uL (ref 0.7–4.0)
MCH: 25 pg — ABNORMAL LOW (ref 26.0–34.0)
MCHC: 31.3 g/dL (ref 30.0–36.0)
MCV: 79.9 fL (ref 78.0–100.0)
MONO ABS: 0.6 10*3/uL (ref 0.1–1.0)
MONOS PCT: 11 %
NEUTROS ABS: 3.1 10*3/uL (ref 1.7–7.7)
NEUTROS PCT: 62 %
Platelets: 215 10*3/uL (ref 150–400)
RBC: 3.64 MIL/uL — ABNORMAL LOW (ref 3.87–5.11)
RDW: 16 % — AB (ref 11.5–15.5)
WBC: 5 10*3/uL (ref 4.0–10.5)

## 2016-09-26 LAB — COMPREHENSIVE METABOLIC PANEL
ALBUMIN: 1.6 g/dL — AB (ref 3.5–5.0)
ALT: 13 U/L — AB (ref 14–54)
AST: 27 U/L (ref 15–41)
Alkaline Phosphatase: 170 U/L — ABNORMAL HIGH (ref 38–126)
Anion gap: 8 (ref 5–15)
BUN: 11 mg/dL (ref 6–20)
CHLORIDE: 106 mmol/L (ref 101–111)
CO2: 21 mmol/L — AB (ref 22–32)
CREATININE: 0.37 mg/dL — AB (ref 0.44–1.00)
Calcium: 7.5 mg/dL — ABNORMAL LOW (ref 8.9–10.3)
GFR calc Af Amer: >60 mL/min (ref >60)
GFR calc non Af Amer: >60 mL/min (ref >60)
GLUCOSE: 112 mg/dL — AB (ref 65–99)
POTASSIUM: 3.8 mmol/L (ref 3.5–5.1)
Sodium: 135 mmol/L (ref 135–145)
Total Bilirubin: 0.2 mg/dL — ABNORMAL LOW (ref 0.3–1.2)
Total Protein: 4.6 g/dL — ABNORMAL LOW (ref 6.5–8.1)

## 2016-09-26 LAB — MAGNESIUM: Magnesium: 1.7 mg/dL (ref 1.7–2.4)

## 2016-09-26 LAB — PHOSPHORUS: Phosphorus: 2.4 mg/dL — ABNORMAL LOW (ref 2.5–4.6)

## 2016-09-26 NOTE — Progress Notes (Signed)
PROGRESS NOTE    Emily Mills  [id number] DOB: 29-Nov-1930 DOA: 09/21/2016 PCP: London Pepper, MD   Brief Narrative:  83 year old female with hypertension, melanoma status post resection, GERD, and other comorbidities presented to the ED with worsening diarrhea, generalized weakness, lightheadedness and almost 40 pound weight loss for the past 6 months. Patient was admitted to the hospital in October for worsening symptoms when she was diagnosed with pneumonia and UTI and discharged to rehabilitation. She was then discharged to an assisted-living. GI saw her on 12/28 for ongoing diarrhea, performed an x-ray which was concerning for bowel obstruction. Colonoscopy was recommended but patient refused. Given significant worsening diarrhea and progressive weakness she was brought to the ED. CT abdomen and pelvis showed large heterogenous enhancing mass likely involving the sigmoid colon. Under went Flexible Sigmoidoscopy which revealed likely malignant completely obstructing tumor. General Surgery was consulted and patient to underwent Diverting Loop Colostomy on 09/24/16.   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 While there are extensive diverticular of within the sigmoid colon, I feel this is most likely a colon mass/ tumor. This abuts the bladder. There is gas within the urinary bladder which is presumably from catheterization. If the patient has not been catheterized, this would be concerning for fistula to the adjacent abnormal sigmoid colon. -Pathology  with invasive adenocarcinoma -CCS consulting, prealbumin <5-nutritional status very poor -Diverting loop colostomy done 09/24/16 -C/w Simethicone 80 mg po  -Diet Advanced to Soft Diet by General Surgery Dr. Harlow Asa -Pain Control with Ketorolac 15 mg IV q6hprn Severe Pain, Morphine 1-2 mg IV q4hprn Severe Pain, Tramadol 50 mg po q6hprn for Moderate Pain and Zofran for N/V -[facility] Nurse consulted for new loop colostomy and saw patient and assessed.  -Continue to Monitor; Per General Surgery patient has Oncology Follow Up arranged -SNF when medically stable for Discharge   Possible UTI (urinary tract infection) -Urinalysis showed TNTC WBC, Large Leukocytes, Many Bacteria, and Negative Nitrite -Patient with Urinary Frequency and Incontinence -Urine Cx shows Multiple Species Present -On Empiric Rocephin Day 5   FTT (failure to thrive) in Adult/Severe Protein Calorie Malnutrition -Will hold on PICC as patient is tolerating Diet ant Feeding Supplements with Boost and Ensure; Will Discuss with General Surgery tomorrow -Diet Advanced to Soft by General Surgery  Oral Thrush -C/w Nystatin 500,000 Units po 4 times Daily -On IV Diflucan 100 mg q24h.  Anemia-Iron deficiency -Likely from GI blood loss/colon CA -Patient's Hb/Hct went from 9.1/29.8 -> 9.7/31.3 -> 9.4/30.4 -> 9.1/29.1 -Continue to monitor, transfuse if <7 -Repeat CBC in AM  Hypokalemia, improved -Patient's K+ was 3.8 -Replete -Repeat CMP in AM  Diarrhea; improved once Diverting Loop Colostomy was made -Colostomy had fluid and Air in Bag; -Was Secondary to obstructing colon mass.  -On bowel regimen with Senna 8.6 mg po BID, Sorbitol 30 mL po PRN for Moderate Consitpation and IVF hydration with NS at 31mL/hr. -Continue to Monitor  Questionable Left Lower Lobe Health Care Associated Pneumonia vs. Atelectasis -CXR 09/17/16 showed Essentially complete clearing of left upper lobe infiltrate compared to 1 month  prior. No new opacity.  Stable cardiac silhouette. There is aortic atherosclerosis. -Patient had CXR done 09/23/16 that Radiologist read as Patchy opacity at the left lung base, new since prior study concerning for pneumonia. Right lung is clear. Heart is mildly enlarged. Trace left pleural effusions suspected -On 08/20/16 Sputum Gram Stain and Cx showed Rare Gram Positive Cocci in Pairs and Cx showed Moderate Candida Albicans -CT Abd/Pelvis on 09/21/16 showed Dependent atelectasis in the lung bases. Trace bilateral pleural effusions. -On Emperic Ceftriaxone and will continue for now -WBC went from 8.8 -> 5.4 -> 5.0; Afebrile and not Short of Breath -C/w Incentive Spirometer -Repeat CXR in AM  Fever, Resolved -Patient was febrile the morning of 09/23/16 at 101.7 -Improved with Tylenol -Blood Cx x 2 Showed NGTD at 3 days; Sputum Cx as above and Urine Cx as above -? Source of New PNA on CXR vs. Colonic Mass -Continue to Monitor Vital Signs  Lower Extremity/Pedal Edema -Per Daughter has been going on for months and comes and goes -Possibly related to Low Albumin (1.6) and poor Nutritional Status -Continue to Monitor and no Hx of Heart Failure; May Warrant an ECHOCardiogram but will hold off for now  DVT prophylaxis: SCD's; Lovenox 40 mg sq  Code Status: DO NOT RESUSCITATE Family Communication: No family present at bedside Disposition Plan: SNF when stable  Consultants:   [facility]  General Surgery   Procedures: Colonoscopy with Biopsy; Diverting loop colostomy    Antimicrobials: Empric Ceftriaxone, and Cefotetan IV for Surgical Prohlyaxis  Subjective: Seen and examined at bedside and states she is doing well after surgery. No N/V and is tolerating clear liquids well. Ate all her jello. No Abdominal Pain. No other concerns or complaints.   Objective: Vitals:   09/25/16 1400 09/25/16 2200 09/26/16 0708 09/26/16 1456  BP: 120/80 130/90 135/85 127/88  Pulse: 97 98 99 (!) 104  Resp: 18 18 18 18     Temp: 97.9 F (36.6 C) 97.8 F (36.6 C) 97.8 F (36.6 C) 98.4 F (36.9 C)  TempSrc: Oral Oral Oral Oral  SpO2: 94% 94% 99%   Weight:      Height:        Intake/Output Summary (Last 24 hours) at 09/26/16 1959 Last data filed at 09/26/16 1800  Gross per 24 hour  Intake             2910 ml  Output              179 ml  Net             2731 ml   Filed Weights   09/22/16 1136  Weight: 69.4 kg (153 lb)   Examination: Physical Exam:  Constitutional: NAD and appears calm and comfortable Eyes: Lids and conjunctivae normal, sclerae anicteric  ENMT: External Ears, Nose appear normal. Slightly hard of hearing.  Neck: Appears normal, supple, no cervical masses, normal ROM, no appreciable thyromegaly, no evidence of JVD Respiratory: Diminished to auscultation bilaterally, no wheezing, rales, rhonchi or crackles. Normal respiratory effort and patient is not tachypenic. No accessory muscle use.  Cardiovascular: RRR, no murmurs / rubs / gallops. S1 and S2 auscultated. 2+ Pitting Pedal extremity edema.  Abdomen: Soft, non-tender, mildly distended. No masses palpated. No appreciable hepatosplenomegaly. Bowel sounds positive x4. Diverting Loop Colostomy in place with some liquid in bag and air in bag with no blood around stoma site.  GU: Deferred. Musculoskeletal: No clubbing / cyanosis of digits/nails. No joint deformity upper and lower extremities.  Skin: No rashes, lesions, ulcers. No induration; Warm and dry.  Neurologic: CN 2-12 grossly intact with no focal deficits. Sensation intact in all 4 Extremities. Romberg sign cerebellar reflexes not assessed.  Psychiatric: Normal judgment and insight. Alert and oriented x 3. Normal mood and appropriate affect.   Data Reviewed: I have personally reviewed following labs and imaging studies  CBC:  Recent Labs Lab 09/21/16 0905 09/22/16 0514 09/24/16 0503 09/25/16 0915 09/26/16 0524  WBC 7.0 5.8 8.8 5.4 5.0  NEUTROABS  --   --   --  3.9 3.1   HGB 10.1* 9.1* 9.7* 9.4* 9.1*  HCT 33.9* 29.8* 31.3* 30.4* 29.1*  MCV 79.0 80.8 79.6 80.2 79.9  PLT 278 285 266 242 [id number]   Basic Metabolic Panel:  Recent Labs Lab 09/21/16 0905 09/21/16 1713 09/22/16 0514 09/24/16 0503 09/25/16 0915 09/26/16 0524  NA 136  --  135 138 135 135  K 4.0  --  3.7 3.7 3.4* 3.8  CL 99*  --  103 104 104 106  CO2 28  --  24 22 25  21*  GLUCOSE 124*  --  106* 111* 111* 112*  BUN 13  --  11 12 14 11   CREATININE 0.35*  --  0.52 0.68 0.46 0.37*  CALCIUM 7.9*  --  7.3* 7.7* 7.7* 7.5*  MG  --  2.1  --   --  1.8 1.7  PHOS  --   --   --   --  2.4* 2.4*   GFR: Estimated Creatinine Clearance: 47.4 mL/min (by C-G formula based on SCr of 0.37 mg/dL (L)). Liver Function Tests:  Recent Labs Lab 09/21/16 0905 09/22/16 0514 09/25/16 0915 09/26/16 0524  AST 28 25 31 27   ALT 13* 11* 14 13*  ALKPHOS 159* 153* 172* 170*  BILITOT 0.4 0.6 0.3 0.2*  PROT 6.0* 4.9* 5.0* 4.6*  ALBUMIN 1.9* 1.6* 1.8* 1.6*    Recent Labs Lab 09/21/16 0905  LIPASE 12   No results for input(s): AMMONIA in the last 168 hours. Coagulation Profile:  Recent Labs Lab 09/22/16 0514  INR 1.20   Cardiac Enzymes: No results for input(s): CKTOTAL, CKMB, CKMBINDEX, TROPONINI in the last 168 hours. BNP (last 3 results) No results for input(s): PROBNP in the last 8760 hours. HbA1C: No results for input(s): HGBA1C in the last 72 hours. CBG: No results for input(s): GLUCAP in the last 168 hours. Lipid Profile: No results for input(s): CHOL, HDL, LDLCALC, TRIG, CHOLHDL, LDLDIRECT in the last 72 hours. Thyroid Function Tests: No results for input(s): TSH, T4TOTAL, FREET4, T3FREE, THYROIDAB in the last 72 hours. Anemia Panel: No results for input(s): VITAMINB12, FOLATE, FERRITIN, TIBC, IRON, RETICCTPCT in the last 72 hours. Sepsis Labs: No results for input(s): PROCALCITON, LATICACIDVEN in the last 168 hours.  Recent Results (from the past 240 hour(s))  Urine culture     Status:  Abnormal   Collection Time: 09/21/16  2:19 PM  Result Value Ref Range Status   Specimen Description URINE, CATHETERIZED  Final   Special Requests NONE  Final   Culture MULTIPLE SPECIES PRESENT, SUGGEST RECOLLECTION (A)  Final   Report Status 09/22/2016 FINAL  Final  Culture, blood (routine x 2)     Status: None (Preliminary result)   Collection Time: 09/23/16  9:50 AM  Result Value Ref Range Status   Specimen Description BLOOD LEFT ARM  Final   Special Requests BOTTLES DRAWN AEROBIC AND ANAEROBIC 5CC  Final   Culture   Final  NO GROWTH 3 DAYS Performed at [facility]    Report Status PENDING  Incomplete  Culture, blood (routine x 2)     Status: None (Preliminary result)   Collection Time: 09/23/16  9:54 AM  Result Value Ref Range Status   Specimen Description BLOOD LEFT HAND  Final   Special Requests IN PEDIATRIC BOTTLE 3CC  Final   Culture   Final    NO GROWTH 3 DAYS Performed at [facility]    Report Status PENDING  Incomplete  Surgical pcr screen     Status: Abnormal   Collection Time: 09/24/16  7:24 AM  Result Value Ref Range Status   MRSA, PCR POSITIVE (A) NEGATIVE Final    Comment: RESULT CALLED TO, READ BACK BY AND VERIFIED WITH: HERRSCHAFT,M @ 1301 ON OS:[id number] BY POTEAT,S    Staphylococcus aureus POSITIVE (A) NEGATIVE Final    Comment:        The Xpert SA Assay (FDA approved for NASAL specimens in patients over 57 years of age), is one component of a comprehensive surveillance program.  Test performance has been validated by [facility] for patients greater than or equal to 14 year old. It is not intended to diagnose infection nor to guide or monitor treatment.     Radiology Studies: No results found. Scheduled Meds: . cefTRIAXone (ROCEPHIN)  IV  1 g Intravenous Q24H  . Chlorhexidine Gluconate Cloth  6 each Topical Once  . Chlorhexidine Gluconate Cloth  6 each Topical Q0600  . enoxaparin (LOVENOX) injection  40 mg Subcutaneous Q24H  .  feeding supplement  1 Container Oral TID BM  . feeding supplement (ENSURE ENLIVE)  237 mL Oral TID BM  . fluconazole (DIFLUCAN) IV  100 mg Intravenous Q24H  . fluticasone  1 spray Each Nare Daily  . mupirocin ointment  1 application Nasal BID  . nystatin  5 mL Oral QID  . nystatin cream  1 application Topical BID  . senna  1 tablet Oral BID   Continuous Infusions: . sodium chloride 75 mL/hr at 09/26/16 1149    LOS: 4 days   Kerney Elbe, DO [facility] Pager [phone]  If 7PM-7AM, please contact night-coverage [email or web address] Password [email or web address] 09/26/2016, 7:59 PM

## 2016-09-26 NOTE — Consult Note (Signed)
[place] Nurse ostomy consult note Stoma type/location: LLQ loop colostomy with proximal end at 12 o'clock.  Created 09/24/16 (Dr. Marcello Moores) Stomal assessment/size: 1 and 3/4 inch stoma, red, budded, moist with red rubber catheter bridge sutured (together) in place. Peristomal assessment: Intact, clear.  Parastomal indentation from 5-8 o'clock. Treatment options for stomal/peristomal skin: 1 and 1/2 skin barrier rings (extra 1/2 ring placed from 5-8 o'clock. Output: + flatus, small amount of effluent in pouch. Ostomy pouching: 2pc. 2 and 3/4 inch pouching system placed with 1 and 1/2 skin barrier ring Education provided: Patient is reassured that ostomy is doing well; she asks if swelling is going down (she recalls this from previous session).  No other education today, patient is not able to grasp emptying this evening.  Educational booklet and supplies provided in room (daughter took the other booklet home on Thursday afternoon). Patient will be going to a SNF following discharge from acute care.  If Dr. Marcello Moores desires rod to be removed prior to discharge, please order or indicate in notes. Enrolled patient in [place] program: No (Patient signed consent card, I will register when it is determined what pouching system works best for her). [place] nursing team will follow, and will remain available to this patient, the nursing, surgical and medical teams.   Thanks, Maudie Flakes, MSN, RN, [place], Arther Abbott  Pager# [phone]

## 2016-09-26 NOTE — Progress Notes (Signed)
  General Surgery [facility]  Assessment & Plan:  Status post lap loop colostomy for obstructing sigmoid carcinoma - POD#2             Advance to soft diet             Pain well controlled             Per medical service             Consult to [place] for stoma care/education entered        Earnstine Regal, MD, [facility]       Office: [phone]    Subjective: Patient in bed, comfortable.  Tolerating liquids, would like to try soft diet.  Objective: Vital signs in last 24 hours: Temp:  [97.8 F (36.6 C)-97.9 F (36.6 C)] 97.8 F (36.6 C) (01/07 0708) Pulse Rate:  [97-99] 99 (01/07 0708) Resp:  [18] 18 (01/07 0708) BP: (120-135)/(80-90) 135/85 (01/07 0708) SpO2:  [94 %-99 %] 99 % (01/07 0708) Last BM Date: 09/25/16  Intake/Output from previous day: 01/06 0701 - 01/07 0700 In: 2785 [P.O.:960; I.V.:1725; IV Piggyback:100] Out: 250 [Stool:250] Intake/Output this shift: No intake/output data recorded.  Physical Exam: HEENT - sclerae clear, mucous membranes moist Abdomen - soft, mild distension; ostomy viable with moderate gas, small fluid in bag  Lab Results:   Recent Labs  09/24/16 0503 09/25/16 0915  WBC 8.8 5.4  HGB 9.7* 9.4*  HCT 31.3* 30.4*  PLT 266 242   BMET  Recent Labs  09/25/16 0915 09/26/16 0524  NA 135 135  K 3.4* 3.8  CL 104 106  CO2 25 21*  GLUCOSE 111* 112*  BUN 14 11  CREATININE 0.46 0.37*  CALCIUM 7.7* 7.5*   PT/INR No results for input(s): LABPROT, INR in the last 72 hours. Comprehensive Metabolic Panel:    Component Value Date/Time   NA 135 09/26/2016 0524   NA 135 09/25/2016 0915   NA 139 08/24/2016   K 3.8 09/26/2016 0524   K 3.4 (L) 09/25/2016 0915   CL 106 09/26/2016 0524   CL 104 09/25/2016 0915   CO2 21 (L) 09/26/2016 0524   CO2 25 09/25/2016 0915   BUN 11 09/26/2016 0524   BUN 14 09/25/2016 0915   BUN 7 08/24/2016   CREATININE 0.37 (L) 09/26/2016 0524   CREATININE 0.46 09/25/2016 0915   GLUCOSE 112 (H) 09/26/2016 0524   GLUCOSE 111 (H) 09/25/2016 0915   CALCIUM 7.5 (L) 09/26/2016 0524   CALCIUM 7.7 (L) 09/25/2016 0915   AST 27 09/26/2016 0524   AST 31 09/25/2016 0915   ALT 13 (L) 09/26/2016 0524   ALT 14 09/25/2016 0915   ALKPHOS 170 (H) 09/26/2016 0524   ALKPHOS 172 (H) 09/25/2016 0915   BILITOT 0.2 (L) 09/26/2016 0524   BILITOT 0.3 09/25/2016 0915   PROT 4.6 (L) 09/26/2016 0524   PROT 5.0 (L) 09/25/2016 0915   ALBUMIN 1.6 (L) 09/26/2016 0524   ALBUMIN 1.8 (L) 09/25/2016 0915    Studies/Results: No results found.    [name] 09/26/2016  Patient ID: Emily Mills, female   DOB: 03/15/1931, 83 y.o.   MRN: [id number]

## 2016-09-27 ENCOUNTER — Encounter (HOSPITAL_COMMUNITY): Payer: Self-pay | Admitting: General Surgery

## 2016-09-27 ENCOUNTER — Inpatient Hospital Stay (HOSPITAL_COMMUNITY): Payer: Medicare Other

## 2016-09-27 LAB — MAGNESIUM: MAGNESIUM: 1.6 mg/dL — AB (ref 1.7–2.4)

## 2016-09-27 LAB — CBC WITH DIFFERENTIAL/PLATELET
BASOS PCT: 0 %
Basophils Absolute: 0 10*3/uL (ref 0.0–0.1)
Eosinophils Absolute: 0.1 10*3/uL (ref 0.0–0.7)
Eosinophils Relative: 1 %
HEMATOCRIT: 30.8 % — AB (ref 36.0–46.0)
Hemoglobin: 9.5 g/dL — ABNORMAL LOW (ref 12.0–15.0)
Lymphocytes Relative: 23 %
Lymphs Abs: 1.3 10*3/uL (ref 0.7–4.0)
MCH: 24.4 pg — AB (ref 26.0–34.0)
MCHC: 30.8 g/dL (ref 30.0–36.0)
MCV: 79.2 fL (ref 78.0–100.0)
MONO ABS: 0.4 10*3/uL (ref 0.1–1.0)
MONOS PCT: 8 %
NEUTROS ABS: 3.9 10*3/uL (ref 1.7–7.7)
Neutrophils Relative %: 68 %
Platelets: 221 10*3/uL (ref 150–400)
RBC: 3.89 MIL/uL (ref 3.87–5.11)
RDW: 16 % — AB (ref 11.5–15.5)
WBC: 5.6 10*3/uL (ref 4.0–10.5)

## 2016-09-27 LAB — COMPREHENSIVE METABOLIC PANEL
ALT: 14 U/L (ref 14–54)
ANION GAP: 8 (ref 5–15)
AST: 28 U/L (ref 15–41)
Albumin: 1.4 g/dL — ABNORMAL LOW (ref 3.5–5.0)
Alkaline Phosphatase: 208 U/L — ABNORMAL HIGH (ref 38–126)
BILIRUBIN TOTAL: 0.3 mg/dL (ref 0.3–1.2)
BUN: 9 mg/dL (ref 6–20)
CO2: 21 mmol/L — ABNORMAL LOW (ref 22–32)
Calcium: 7.5 mg/dL — ABNORMAL LOW (ref 8.9–10.3)
Chloride: 106 mmol/L (ref 101–111)
Creatinine, Ser: 0.46 mg/dL (ref 0.44–1.00)
Glucose, Bld: 95 mg/dL (ref 65–99)
POTASSIUM: 3.6 mmol/L (ref 3.5–5.1)
Sodium: 135 mmol/L (ref 135–145)
TOTAL PROTEIN: 4.4 g/dL — AB (ref 6.5–8.1)

## 2016-09-27 LAB — PHOSPHORUS: Phosphorus: 2.5 mg/dL (ref 2.5–4.6)

## 2016-09-27 MED ORDER — LIP MEDEX EX OINT
1.0000 "application " | TOPICAL_OINTMENT | Freq: Two times a day (BID) | CUTANEOUS | Status: DC
  Administered 2016-09-28 – 2016-10-01 (×6): 1 via TOPICAL
  Filled 2016-09-27 (×3): qty 7

## 2016-09-27 MED ORDER — ALUM & MAG HYDROXIDE-SIMETH 200-200-20 MG/5ML PO SUSP
30.0000 mL | Freq: Four times a day (QID) | ORAL | Status: DC | PRN
Start: 2016-09-27 — End: 2016-10-01
  Administered 2016-09-28: 30 mL via ORAL
  Filled 2016-09-27: qty 30

## 2016-09-27 MED ORDER — PHENOL 1.4 % MT LIQD
2.0000 | OROMUCOSAL | Status: DC | PRN
  Filled 2016-09-27: qty 177

## 2016-09-27 MED ORDER — MAGIC MOUTHWASH
15.0000 mL | Freq: Four times a day (QID) | ORAL | Status: DC | PRN
  Filled 2016-09-27: qty 15

## 2016-09-27 MED ORDER — FAMOTIDINE 20 MG PO TABS
20.0000 mg | ORAL_TABLET | Freq: Every day | ORAL | Status: DC
  Administered 2016-09-28 – 2016-10-01 (×4): 20 mg via ORAL
  Filled 2016-09-27 (×4): qty 1

## 2016-09-27 MED ORDER — AMLODIPINE BESYLATE 5 MG PO TABS
2.5000 mg | ORAL_TABLET | Freq: Every day | ORAL | Status: DC
  Administered 2016-09-28 – 2016-09-29 (×2): 2.5 mg via ORAL
  Filled 2016-09-27 (×2): qty 1

## 2016-09-27 MED ORDER — FENTANYL CITRATE (PF) 100 MCG/2ML IJ SOLN
25.0000 ug | INTRAMUSCULAR | Status: DC | PRN
  Administered 2016-09-28 – 2016-09-30 (×15): 50 ug via INTRAVENOUS
  Administered 2016-10-01: 25 ug via INTRAVENOUS
  Administered 2016-10-01: 50 ug via INTRAVENOUS
  Filled 2016-09-27 (×17): qty 2

## 2016-09-27 MED ORDER — GUAIFENESIN ER 600 MG PO TB12: 600.0000 mg | ORAL_TABLET | Freq: Two times a day (BID) | ORAL | Status: DC | PRN

## 2016-09-27 MED ORDER — LACTATED RINGERS IV BOLUS (SEPSIS): 1000.0000 mL | Freq: Three times a day (TID) | INTRAVENOUS | Status: AC | PRN

## 2016-09-27 MED ORDER — MENTHOL 3 MG MT LOZG: 1.0000 | LOZENGE | OROMUCOSAL | Status: DC | PRN

## 2016-09-27 NOTE — Addendum Note (Signed)
Addendum  created 09/27/16 1542 by Duane Boston, MD   SmartForm saved

## 2016-09-27 NOTE — Consult Note (Signed)
[place] Nurse ostomy follow up Stoma type/location: LLQ loop colostomy. Pouch applied yesterday remains intact. Daughter in to visit at time of my assessment. Reports mother had short episode of confusion earlier today and that this is a new finding. Stomal assessment/size: per yesterday, 1 and 3/4 inch stoma with red rubber catheter intact. Peristomal assessment: Not seen today Treatment options for stomal/peristomal skin: 1.5 skin barrier rings placed yesterday Output: + flatus, small amount of light brown liquid in pouch. No stool. Ostomy pouching: 2pc. 2 and 3/4 inch pouching system Education provided: Patient is very weak today. She has refused PT session x2 and is up in chair at the time of my visit.  Nursing staff (2) are attempting to use the Clarise Cruz walker to assist with transfer from chair to bed and patient cannot assist at all with transfer. Has been complaining of nausea today and is eating very little. Enrolled patient in [place] [facility] Discharge program: No. [place] nursing team will follow, and will remain available to this patient, the nursing, surgical and medical teams. Thanks, Maudie Flakes, MSN, RN, [place], Arther Abbott  Pager# [phone]

## 2016-09-27 NOTE — Progress Notes (Signed)
Nutrition Follow-up  DOCUMENTATION CODES:   Severe malnutrition in context of chronic illness  INTERVENTION:   Continue Boost Breeze po TID, each supplement provides 250 kcal and 9 grams of protein Continue Ensure Enlive po BID, each supplement provides 350 kcal and 20 grams of protein RD will continue to monitor for needs  NUTRITION DIAGNOSIS:   Malnutrition related to chronic illness as evidenced by energy intake < or equal to 75% for > or equal to 1 month, moderate depletion of body fat, severe depletion of muscle mass.  Ongoing.  GOAL:   Patient will meet greater than or equal to 90% of their needs  Progressing.  MONITOR:   PO intake, Supplement acceptance, Labs, Weight trends, I & O's  ASSESSMENT:   83 year old female with hypertension, melanoma status post resection, GERD, and other comorbidities presented to the ED with worsening diarrhea, generalized weakness, lightheadedness and almost 40 pound weight loss for the past 6 months. Patient was admitted to the hospital in October for worsening symptoms when she was diagnosed with pneumonia and UTI and discharged to rehabilitation. She was then discharged to an assisted-living. GI saw her on 12/28 for ongoing diarrhea, performed an x-ray which was concerning for bowel obstruction. Colonoscopy was recommended but patient refused. Given significant worsening diarrhea and progressive weakness she was brought to the ED. CT abdomen and pelvis showed large heterogenous enhancing mass likely involving the sigmoid colon. Under went Flexible Sigmoidoscopy which revealed likely malignant completely obstructing tumor. Now s/p LAPAROSCOPIC SPLENIC FLEXURE AND MOBILIZATION, CREATION OF LOOP COLOSTOMY  Pt currently consuming 50-60% of meals on soft diet. Pt is drinking Ensure and Boost Breeze supplements. Will continue supplements at this time.  Medications: Senokot tablet BID Labs reviewed: Low Mg Phos/K WNL  Diet Order:  DIET SOFT  Room service appropriate? Yes; Fluid consistency: Thin  Skin:  Reviewed, no issues  Last BM:  1/7 -colostomy  Height:   Ht Readings from Last 1 Encounters:  09/22/16 5\' 1"  (1.549 m)    Weight:   Wt Readings from Last 1 Encounters:  09/22/16 153 lb (69.4 kg)    Ideal Body Weight:  47.7 kg  BMI:  Body mass index is 28.91 kg/m.  Estimated Nutritional Needs:   Kcal:  1550-1750  Protein:  75-85g  Fluid:  1.5-1.7L/day  EDUCATION NEEDS:   Education needs no appropriate at this time  Clayton Bibles, MS, RD, LDN Pager: [phone] After Hours Pager: [phone]

## 2016-09-27 NOTE — Progress Notes (Signed)
PROGRESS NOTE    Emily Mills  [id number] DOB: 14-Mar-1931 DOA: 09/21/2016 PCP: London Pepper, MD   Brief Narrative:  83 year old female with hypertension, melanoma status post resection, GERD, and other comorbidities presented to the ED with worsening diarrhea, generalized weakness, lightheadedness and almost 40 pound weight loss for the past 6 months. Patient was admitted to the hospital in October for worsening symptoms when she was diagnosed with pneumonia and UTI and discharged to rehabilitation. She was then discharged to an assisted-living. GI saw her on 12/28 for ongoing diarrhea, performed an x-ray which was concerning for bowel obstruction. Colonoscopy was recommended but patient refused. Given significant worsening diarrhea and progressive weakness she was brought to the ED. CT abdomen and pelvis showed large heterogenous enhancing mass likely involving the sigmoid colon. Under went Flexible Sigmoidoscopy which revealed likely malignant completely obstructing tumor. General Surgery was consulted and patient to underwent Diverting Loop Colostomy on 09/24/16.   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 While there are extensive diverticular of within the sigmoid colon, I feel this is most likely a colon mass/ tumor. This abuts the bladder. There is gas within the urinary bladder which is presumably from catheterization. If the patient has not been catheterized, this would be concerning for fistula to the adjacent abnormal sigmoid colon. -Pathology  with invasive adenocarcinoma -CCS consulting, prealbumin <5-nutritional status very poor -Diverting loop colostomy done 09/24/16 -C/w Simethicone 80 mg po  -Diet Backed off to Clear Liquids because of increased Nausea and Abdominal Distention -Abdominal Flat Plate ordered by General Surgery and shows ileus? -Pain Control with Ketorolac 15 mg IV q6hprn Severe Pain, Morphine 1-2 mg IV q4hprn Severe Pain, Tramadol 50 mg po q6hprn for Moderate Pain and Zofran for N/V -WOC Nurse consulted for new loop colostomy and saw patient and assessed.  -Continue to Monitor; Per General Surgery patient has Oncology Follow Up arranged -SNF when medically stable for Discharge   Possible UTI (Urinary Tract Infection) -Urinalysis showed TNTC WBC, Large Leukocytes, Many Bacteria, and Negative Nitrite -Patient with Urinary Frequency and Incontinence -Urine Cx shows Multiple Species Present -On Empiric Rocephin Day 6; Will Continue for 1 more Day   FTT (failure to thrive) in Adult/Severe Protein Calorie Malnutrition -Will hold on PICC as patient is tolerating Diet ant Feeding Supplements with Boost and Ensure; Will Discuss with General Surgery tomorrow -Diet dropped back to Clears because of Nausea and abdominal distention   Oral Thrush -C/w Nystatin 500,000 Units po 4 times Daily -On IV Diflucan 100 mg q24h.  Anemia-Iron deficiency -Likely from GI blood loss/colon CA -Patient's Hb/Hct went from 9.1/29.8 -> 9.7/31.3 -> 9.4/30.4 -> 9.1/29.1 -> 9.5/30.8 -Continue to monitor, transfuse if <7 -Repeat CBC in AM  Hypokalemia, improved -Patient's K+ was 3.6 -Replete as Necessary -Repeat CMP in AM  Questionable Left Lower Lobe Health Care Associated Pneumonia vs. Atelectasis -CXR 09/17/16 showed Essentially complete clearing of left upper lobe infiltrate compared to 1 month prior. No new opacity. Stable cardiac silhouette. There is aortic atherosclerosis. -Patient had CXR done 09/23/16 that Radiologist read  as Patchy  opacity at the left lung base, new since prior study concerning for pneumonia. Right lung is clear. Heart is mildly enlarged. Trace left pleural effusions suspected -On 08/20/16 Sputum Gram Stain and Cx showed Rare Gram Positive Cocci in Pairs and Cx showed Moderate Candida Albicans -CT Abd/Pelvis on 09/21/16 showed Dependent atelectasis in the lung bases. Trace bilateral pleural effusions. -On Emperic Ceftriaxone and will continue for now -WBC went from 8.8 -> 5.4 -> 5.0 -> 5.6; Afebrile and not Short of Breath -C/w Incentive Spirometer -Repeat CXR in AM  Fever, Resolved and no reoccurrence  -Patient was febrile the morning of 09/23/16 at 101.7 -Improved with Tylenol -Blood Cx x 2 Showed NGTD at 3 days; Sputum Cx as above and Urine Cx as above -? Source of New PNA on CXR vs. Colonic Mass -Continue to Monitor Vital Signs  Lower Extremity/Pedal Edema -Per Daughter has been going on for months and comes and goes -Possibly related to Low Albumin (1.4) and poor Nutritional Status -Continue to Monitor and no Hx of Heart Failure; May Warrant an ECHOCardiogram but will hold off for now  DVT prophylaxis: SCD's; Lovenox 40 mg sq  Code Status: DO NOT RESUSCITATE Family Communication: No family present at bedside Disposition Plan: SNF when stable  Consultants:   [facility]  General Surgery   Procedures: Colonoscopy with Biopsy; Diverting loop colostomy    Antimicrobials: Empric Ceftriaxone, and Cefotetan IV for Surgical Prohlyaxis  Subjective: Seen and examined at bedside and states she is doing well after surgery. Denied any complaints to me this AM and was tolerating diet but then became nauseous with abdominal distention this afternoon apparently. General Surgery ordered Abd/Flat Plate and shows likely ileus. Per General Surgery recc's will place NGT and obtain CT Scan if gets worse.   Objective: Vitals:   09/26/16 1456 09/26/16 2142 09/27/16 0604 09/27/16 1258  BP:  127/88 130/87 131/84 123/71  Pulse: (!) 104 (!) 113 (!) 109 (!) 114  Resp: 18 18 18 18   Temp: 98.4 F (36.9 C) 98.2 F (36.8 C) 98.6 F (37 C) 99 F (37.2 C)  TempSrc: Oral Oral Oral Oral  SpO2:  94% 96% 95%  Weight:      Height:        Intake/Output Summary (Last 24 hours) at 09/27/16 1508 Last data filed at 09/27/16 1400  Gross per 24 hour  Intake             2760 ml  Output                0 ml  Net             2760 ml   Filed Weights   09/22/16 1136  Weight: 69.4 kg (153 lb)   Examination: Physical Exam this AM:  Constitutional: NAD and appears calm and comfortable Eyes: Lids and conjunctivae normal, sclerae anicteric  ENMT: External Ears, Nose appear normal. Slightly hard of hearing.  Neck: Appears normal, supple, no cervical masses, normal ROM, no appreciable thyromegaly, no evidence of JVD Respiratory: Diminished to auscultation bilaterally, no wheezing, rales, rhonchi or crackles. Normal respiratory effort and patient is not tachypenic. No accessory muscle use.  Cardiovascular: RRR, no murmurs / rubs / gallops. S1 and S2 auscultated. 2+ Pitting Pedal extremity edema.  Abdomen: Soft, non-tender, mildly distended. No masses palpated. No appreciable hepatosplenomegaly. Bowel sounds positive x4. Diverting Loop Colostomy in place with no liquid and minimal air in bag.  GU: Deferred. Musculoskeletal: No clubbing / cyanosis of digits/nails.  No joint deformity upper and lower extremities.   Skin: No rashes, lesions, ulcers. No induration; Warm and dry.  Neurologic: CN 2-12 grossly intact with no focal deficits. Sensation intact in all 4 Extremities. Romberg sign cerebellar reflexes not assessed.  Psychiatric: Normal judgment and insight. Alert and oriented x 3. Normal mood and appropriate affect.   Data Reviewed: I have personally reviewed following labs and imaging studies  CBC:  Recent Labs Lab 09/22/16 0514 09/24/16 0503 09/25/16 0915 09/26/16 0524 09/27/16 0526    WBC 5.8 8.8 5.4 5.0 5.6  NEUTROABS  --   --  3.9 3.1 3.9  HGB 9.1* 9.7* 9.4* 9.1* 9.5*  HCT 29.8* 31.3* 30.4* 29.1* 30.8*  MCV 80.8 79.6 80.2 79.9 79.2  PLT 285 266 242 215 [id number]   Basic Metabolic Panel:  Recent Labs Lab 09/21/16 1713 09/22/16 0514 09/24/16 0503 09/25/16 0915 09/26/16 0524 09/27/16 0526  NA  --  135 138 135 135 135  K  --  3.7 3.7 3.4* 3.8 3.6  CL  --  103 104 104 106 106  CO2  --  24 22 25  21* 21*  GLUCOSE  --  106* 111* 111* 112* 95  BUN  --  11 12 14 11 9   CREATININE  --  0.52 0.68 0.46 0.37* 0.46  CALCIUM  --  7.3* 7.7* 7.7* 7.5* 7.5*  MG 2.1  --   --  1.8 1.7 1.6*  PHOS  --   --   --  2.4* 2.4* 2.5   GFR: Estimated Creatinine Clearance: 47.4 mL/min (by C-G formula based on SCr of 0.46 mg/dL). Liver Function Tests:  Recent Labs Lab 09/21/16 0905 09/22/16 0514 09/25/16 0915 09/26/16 0524 09/27/16 0526  AST 28 25 31 27 28   ALT 13* 11* 14 13* 14  ALKPHOS 159* 153* 172* 170* 208*  BILITOT 0.4 0.6 0.3 0.2* 0.3  PROT 6.0* 4.9* 5.0* 4.6* 4.4*  ALBUMIN 1.9* 1.6* 1.8* 1.6* 1.4*    Recent Labs Lab 09/21/16 0905  LIPASE 12   No results for input(s): AMMONIA in the last 168 hours. Coagulation Profile:  Recent Labs Lab 09/22/16 0514  INR 1.20   Cardiac Enzymes: No results for input(s): CKTOTAL, CKMB, CKMBINDEX, TROPONINI in the last 168 hours. BNP (last 3 results) No results for input(s): PROBNP in the last 8760 hours. HbA1C: No results for input(s): HGBA1C in the last 72 hours. CBG: No results for input(s): GLUCAP in the last 168 hours. Lipid Profile: No results for input(s): CHOL, HDL, LDLCALC, TRIG, CHOLHDL, LDLDIRECT in the last 72 hours. Thyroid Function Tests: No results for input(s): TSH, T4TOTAL, FREET4, T3FREE, THYROIDAB in the last 72 hours. Anemia Panel: No results for input(s): VITAMINB12, FOLATE, FERRITIN, TIBC, IRON, RETICCTPCT in the last 72 hours. Sepsis Labs: No results for input(s): PROCALCITON, LATICACIDVEN in the  last 168 hours.  Recent Results (from the past 240 hour(s))  Urine culture     Status: Abnormal   Collection Time: 09/21/16  2:19 PM  Result Value Ref Range Status   Specimen Description URINE, CATHETERIZED  Final   Special Requests NONE  Final   Culture MULTIPLE SPECIES PRESENT, SUGGEST RECOLLECTION (A)  Final   Report Status 09/22/2016 FINAL  Final  Culture, blood (routine x 2)     Status: None (Preliminary result)   Collection Time: 09/23/16  9:50 AM  Result Value Ref Range Status   Specimen Description BLOOD LEFT ARM  Final   Special Requests BOTTLES DRAWN AEROBIC AND ANAEROBIC  5CC  Final   Culture   Final    NO GROWTH 3 DAYS Performed at [facility]    Report Status PENDING  Incomplete  Culture, blood (routine x 2)     Status: None (Preliminary result)   Collection Time: 09/23/16  9:54 AM  Result Value Ref Range Status   Specimen Description BLOOD LEFT HAND  Final   Special Requests IN PEDIATRIC BOTTLE 3CC  Final   Culture   Final    NO GROWTH 3 DAYS Performed at [facility]    Report Status PENDING  Incomplete  Surgical pcr screen     Status: Abnormal   Collection Time: 09/24/16  7:24 AM  Result Value Ref Range Status   MRSA, PCR POSITIVE (A) NEGATIVE Final    Comment: RESULT CALLED TO, READ BACK BY AND VERIFIED WITH: HERRSCHAFT,M @ 1301 ON OS:[id number] BY POTEAT,S    Staphylococcus aureus POSITIVE (A) NEGATIVE Final    Comment:        The Xpert SA Assay (FDA approved for NASAL specimens in patients over 37 years of age), is one component of a comprehensive surveillance program.  Test performance has been validated by [facility] for patients greater than or equal to 68 year old. It is not intended to diagnose infection nor to guide or monitor treatment.     Radiology Studies: No results found. Scheduled Meds: . cefTRIAXone (ROCEPHIN)  IV  1 g Intravenous Q24H  . Chlorhexidine Gluconate Cloth  6 each Topical Once  . Chlorhexidine Gluconate  Cloth  6 each Topical Q0600  . enoxaparin (LOVENOX) injection  40 mg Subcutaneous Q24H  . feeding supplement  1 Container Oral TID BM  . feeding supplement (ENSURE ENLIVE)  237 mL Oral TID BM  . fluconazole (DIFLUCAN) IV  100 mg Intravenous Q24H  . fluticasone  1 spray Each Nare Daily  . mupirocin ointment  1 application Nasal BID  . nystatin  5 mL Oral QID  . nystatin cream  1 application Topical BID  . senna  1 tablet Oral BID   Continuous Infusions:   LOS: 5 days   Kerney Elbe, DO [facility] Pager [phone]  If 7PM-7AM, please contact night-coverage [email or web address] Password [email or web address] 09/27/2016, 3:08 PM

## 2016-09-27 NOTE — Progress Notes (Signed)
Notified PA, about patient suffering from nausea, not wanting to eat, and daughter's concern about her mother's abdomen looking increasingly swollen.  PA suggested going back to a clear diet and having an x-ray done to determine if a post-op ileus is present. This information was relayed to the patient's daughter. Will continue to monitor.

## 2016-09-27 NOTE — Progress Notes (Signed)
PT Cancellation Note  Patient Details Name: Haillee Firebaugh MRN: [id number] DOB: September 13, 1931   Cancelled Treatment:    Reason Eval/Treat Not Completed: Pain limiting ability to participate (per family, pt is in too much pain to participate today, she recently got up to recliner with significant difficulty. Will follow. )   Philomena Doheny 09/27/2016, 2:47 PM [phone]

## 2016-09-27 NOTE — Care Management Important Message (Signed)
Important Message  Patient Details IM Letter given to Susanne/Case Manager to present to Patient Name: Nakshatra Kettinger MRN: [id number] Date of Birth: April 13, 1931   Medicare Important Message Given:  Yes    Kerin Salen 09/27/2016, 3:03 [place] Message  Patient Details  Name: Luciana Illes MRN: [id number] Date of Birth: [birth year]   Medicare Important Message Given:  Yes    Kerin Salen 09/27/2016, 3:03 PM

## 2016-09-27 NOTE — Progress Notes (Signed)
[facility] Progress Note  3 Days Post-Op  Subjective: Pain controlled w PO meds - rated 8/10. Tolerating PO without nausea/vomiting. Denies stool output in ostomy pouch. Working with therapies - sitting on edge of bed.   Objective: Vital signs in last 24 hours: Temp:  [98.2 F (36.8 C)-98.6 F (37 C)] 98.6 F (37 C) (01/08 0604) Pulse Rate:  [104-113] 109 (01/08 0604) Resp:  [18] 18 (01/08 0604) BP: (127-131)/(84-88) 131/84 (01/08 0604) SpO2:  [94 %-96 %] 96 % (01/08 0604) Last BM Date: 09/26/16 (colostomy)  Intake/Output from previous day: 01/07 0701 - 01/08 0700 In: 3150 [P.O.:1250; I.V.:1800; IV Piggyback:100] Out: 129 [Stool:129] Intake/Output this shift: No intake/output data recorded.  PE: Gen:  Alert, NAD, pleasant and cooperative Pulm:  nonlabored, CTA, no W/R/R Abd: Soft, appropriately tender, ND, present but hypoactive BS, ncisions C/D/I, colostomy pouch with gas and minimal fluid  Lab Results:   Recent Labs  09/26/16 0524 09/27/16 0526  WBC 5.0 5.6  HGB 9.1* 9.5*  HCT 29.1* 30.8*  PLT 215 221   BMET  Recent Labs  09/26/16 0524 09/27/16 0526  NA 135 135  K 3.8 3.6  CL 106 106  CO2 21* 21*  GLUCOSE 112* 95  BUN 11 9  CREATININE 0.37* 0.46  CALCIUM 7.5* 7.5*   PT/INR No results for input(s): LABPROT, INR in the last 72 hours. CMP     Component Value Date/Time   NA 135 09/27/2016 0526   NA 139 08/24/2016   K 3.6 09/27/2016 0526   CL 106 09/27/2016 0526   CO2 21 (L) 09/27/2016 0526   GLUCOSE 95 09/27/2016 0526   BUN 9 09/27/2016 0526   BUN 7 08/24/2016   CREATININE 0.46 09/27/2016 0526   CALCIUM 7.5 (L) 09/27/2016 0526   PROT 4.4 (L) 09/27/2016 0526   ALBUMIN 1.4 (L) 09/27/2016 0526   AST 28 09/27/2016 0526   ALT 14 09/27/2016 0526   ALKPHOS 208 (H) 09/27/2016 0526   BILITOT 0.3 09/27/2016 0526   GFRNONAA >60 09/27/2016 0526   GFRAA >60 09/27/2016 0526   Lipase     Component Value Date/Time   LIPASE 12 09/21/2016  0905       Studies/Results: No results found.  Anti-infectives: Anti-infectives    Start     Dose/Rate Route Frequency Ordered Stop   09/24/16 0600  cefoTEtan (CEFOTAN) 2 g in dextrose 5 % 50 mL IVPB     2 g 100 mL/hr over 30 Minutes Intravenous On call to O.R. 09/23/16 1557 09/24/16 1111   09/22/16 1700  fluconazole (DIFLUCAN) IVPB 100 mg     100 mg 50 mL/hr over 60 Minutes Intravenous Every 24 hours 09/21/16 1611     09/21/16 1700  fluconazole (DIFLUCAN) IVPB 200 mg     200 mg 100 mL/hr over 60 Minutes Intravenous  Once 09/21/16 1608 09/21/16 2030   09/21/16 1700  cefTRIAXone (ROCEPHIN) 1 g in dextrose 5 % 50 mL IVPB     1 g 100 mL/hr over 30 Minutes Intravenous Every 24 hours 09/21/16 1608         Assessment/Plan Status post lap loop colostomy for obstructing sigmoid carcinoma - POD#3, Dr. Leighton Ruff  tolerating soft diet  Having stool output into colostomy pouch  Pain well controlled Per medical service              Appreciate WOC assistance with stoma care/education - will need home health RN for ostomy care. Red rubber catheter may be  removed on the day of discharge.  Plan: continue soft diet and await further bowel function Mobilize with PT SNF at discharge  Paged this afternoon around 3:30PM - increased nausea, abdominal distention. No vomiting or hiccups. Suspect postoperative ileus. Back off diet to clear liquids and abd film ordered. PRN nausea medications. Hold off on NGT placement.    LOS: 5 days    Emily Mills , [facility] 09/27/2016, 10:39 AM Pager: [phone] Consults: [phone] Mon-Fri 7:00 am-4:30 pm Sat-Sun 7:00 am-11:30 am

## 2016-09-27 NOTE — Progress Notes (Signed)
OT Cancellation Note  Patient Details Name: Emily Mills MRN: [id number] DOB: 05-09-1931   Cancelled Treatment:    Reason Eval/Treat Not Completed: Fatigue/lethargy limiting ability to participate Pt stated she just had morphine and wants to sleep at this time.  Pt refused despite encouragement - will check on pt later in day or next day  Kari Baars, [place] Mccallum D 09/27/2016, 10:56 AM

## 2016-09-27 NOTE — Progress Notes (Signed)
PT Cancellation Note  Patient Details Name: Emily Mills MRN: [id number] DOB: [birth year]   Cancelled Treatment:    Reason Eval/Treat Not Completed: Medical issues which prohibited therapy (pt feeling tired and "woozy", would like to rest and try PT later today. Will follow. )   Philomena Doheny 09/27/2016, 10:48 AM [phone]

## 2016-09-28 ENCOUNTER — Inpatient Hospital Stay (HOSPITAL_COMMUNITY): Payer: Medicare Other

## 2016-09-28 DIAGNOSIS — R0600 Dyspnea, unspecified: Secondary | ICD-10-CM

## 2016-09-28 LAB — CBC WITH DIFFERENTIAL/PLATELET
BASOS PCT: 0 %
Basophils Absolute: 0 10*3/uL (ref 0.0–0.1)
EOS ABS: 0 10*3/uL (ref 0.0–0.7)
EOS PCT: 0 %
HCT: 30.7 % — ABNORMAL LOW (ref 36.0–46.0)
Hemoglobin: 9.9 g/dL — ABNORMAL LOW (ref 12.0–15.0)
Lymphocytes Relative: 20 %
Lymphs Abs: 1.3 10*3/uL (ref 0.7–4.0)
MCH: 24.6 pg — ABNORMAL LOW (ref 26.0–34.0)
MCHC: 32.2 g/dL (ref 30.0–36.0)
MCV: 76.4 fL — ABNORMAL LOW (ref 78.0–100.0)
MONO ABS: 0.5 10*3/uL (ref 0.1–1.0)
MONOS PCT: 7 %
Neutro Abs: 4.9 10*3/uL (ref 1.7–7.7)
Neutrophils Relative %: 73 %
Platelets: 217 10*3/uL (ref 150–400)
RBC: 4.02 MIL/uL (ref 3.87–5.11)
RDW: 15.7 % — AB (ref 11.5–15.5)
WBC: 6.8 10*3/uL (ref 4.0–10.5)

## 2016-09-28 LAB — ECHOCARDIOGRAM COMPLETE
Height: 61 in
Weight: 2448 oz

## 2016-09-28 LAB — CULTURE, BLOOD (ROUTINE X 2)
Culture: NO GROWTH
Culture: NO GROWTH

## 2016-09-28 LAB — COMPREHENSIVE METABOLIC PANEL
ALBUMIN: 1.4 g/dL — AB (ref 3.5–5.0)
ALT: 12 U/L — ABNORMAL LOW (ref 14–54)
ANION GAP: 5 (ref 5–15)
AST: 21 U/L (ref 15–41)
Alkaline Phosphatase: 190 U/L — ABNORMAL HIGH (ref 38–126)
BUN: 10 mg/dL (ref 6–20)
CO2: 23 mmol/L (ref 22–32)
Calcium: 7.6 mg/dL — ABNORMAL LOW (ref 8.9–10.3)
Chloride: 107 mmol/L (ref 101–111)
Creatinine, Ser: 0.42 mg/dL — ABNORMAL LOW (ref 0.44–1.00)
GFR calc non Af Amer: >60 mL/min (ref >60)
GLUCOSE: 98 mg/dL (ref 65–99)
POTASSIUM: 3.4 mmol/L — AB (ref 3.5–5.1)
SODIUM: 135 mmol/L (ref 135–145)
TOTAL PROTEIN: 4.4 g/dL — AB (ref 6.5–8.1)
Total Bilirubin: 0.5 mg/dL (ref 0.3–1.2)

## 2016-09-28 LAB — PHOSPHORUS: Phosphorus: 3 mg/dL (ref 2.5–4.6)

## 2016-09-28 LAB — MAGNESIUM: Magnesium: 1.6 mg/dL — ABNORMAL LOW (ref 1.7–2.4)

## 2016-09-28 MED ORDER — FUROSEMIDE 10 MG/ML IJ SOLN
40.0000 mg | Freq: Once | INTRAMUSCULAR | Status: AC
  Administered 2016-09-28: 40 mg via INTRAVENOUS
  Filled 2016-09-28: qty 4

## 2016-09-28 MED ORDER — LEVOFLOXACIN IN D5W 750 MG/150ML IV SOLN
750.0000 mg | INTRAVENOUS | Status: DC
  Administered 2016-09-28: 750 mg via INTRAVENOUS
  Filled 2016-09-28: qty 150

## 2016-09-28 MED ORDER — IPRATROPIUM-ALBUTEROL 0.5-2.5 (3) MG/3ML IN SOLN
3.0000 mL | Freq: Two times a day (BID) | RESPIRATORY_TRACT | Status: DC
  Administered 2016-09-28 – 2016-10-01 (×5): 3 mL via RESPIRATORY_TRACT
  Filled 2016-09-28 (×6): qty 3

## 2016-09-28 MED ORDER — IPRATROPIUM-ALBUTEROL 0.5-2.5 (3) MG/3ML IN SOLN
3.0000 mL | Freq: Four times a day (QID) | RESPIRATORY_TRACT | Status: DC
  Administered 2016-09-28: 3 mL via RESPIRATORY_TRACT
  Filled 2016-09-28: qty 3

## 2016-09-28 MED ORDER — ALBUTEROL SULFATE (2.5 MG/3ML) 0.083% IN NEBU: 2.5000 mg | INHALATION_SOLUTION | RESPIRATORY_TRACT | Status: DC | PRN

## 2016-09-28 NOTE — Progress Notes (Signed)
PHARMACY NOTE:  ANTIMICROBIAL RENAL DOSAGE ADJUSTMENT  Current antimicrobial regimen includes a mismatch between antimicrobial dosage and estimated renal function.  As per policy approved by the Pharmacy & Therapeutics and Medical Executive Committees, the antimicrobial dosage will be adjusted accordingly.  Current antimicrobial dosage:  Levaquin 750mg  IV daily  Indication: PNA  Renal Function:  Estimated Creatinine Clearance: 47.4 mL/min (by C-G formula based on SCr of 0.42 mg/dL (L)). []      On intermittent HD, scheduled: []      On CRRT    Antimicrobial dosage has been changed to:  750mg  IV q48  Additional comments:   Thank you for allowing pharmacy to be a part of this patient's care.  Kara Mead, [facility] 09/28/2016 9:10 AM

## 2016-09-28 NOTE — Progress Notes (Signed)
Pt has edema in abdominal area and bilateral legs, all 3+. [name] concerned at this time, asked to call physician on call to see if a fluid pill could be ordered. Pt has no fluids running and a lower blood pressure this evening with an SBP in the 110s, compared to previous checks where her SBP was in the 130s. No response from NP on call. Lungs sound clear, will continue to monitor patient at this time.   Emily Mills

## 2016-09-28 NOTE — Progress Notes (Signed)
Physical Therapy Treatment Patient Details Name: Emily Mills MRN: [id number] DOB: 1931-07-25 Today's Date: 09/28/2016    History of Present Illness HPI: Emily Mills is a 83 y.o. female with medical history significant of HTN, prior hx of melanoma s/p resection, GERD, presents to the ED with complaints of worsening diarrhea, worsening generalized weakness, lightheadedness and a 40 pound weight loss over the past 6 months.; pt was recentlly admitted with frequent falls. 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    PT Comments    Progressing slowly with mobility. Pt now requiring +2 assist for mobility. Continue to recommend SNF.   Follow Up Recommendations  SNF     Equipment Recommendations  None recommended by PT    Recommendations for Other Services       Precautions / Restrictions Precautions Precautions: Fall Restrictions Weight Bearing Restrictions: No    Mobility  Bed Mobility Overal bed mobility: Needs Assistance Bed Mobility: Supine to Sit Rolling: Mod assist;+2 for physical assistance;+2 for safety/equipment         General bed mobility comments: assist for trunk and bil LEs. Multimoal cueing required. Utilized bedpad for scooting, positioning. Pt somewhat fearfull  Transfers Overall transfer level: Needs assistance Equipment used: Rolling walker (2 wheeled) Transfers: Sit to/from [facility] Sit to Stand: Mod assist;+2 physical assistance;+2 safety/equipment;From elevated surface Stand pivot transfers: Mod assist;+2 physical assistance;+2 safety/equipment       General transfer comment: Sit to stand x3. Assist to rise, stabilize, control descent. Multimodal cueing required. Posterior lean and flexed posture in standing. Cues for safety, posture, use of UEs/proper RW use. Stand pivot, bed to recliner, with RW.   Ambulation/Gait Ambulation/Gait assistance: Mod assist;+2 physical assistance;+2  safety/equipment Ambulation Distance (Feet): 3 Feet Assistive device: Rolling walker (2 wheeled) Gait Pattern/deviations: Step-to pattern;Decreased step length - right;Decreased step length - left;Decreased stride length;Trunk flexed     General Gait Details: Assist to stabilize/support pt, maneuver with RW, follow with recliner. Pt fatigues very easily.    Stairs            Wheelchair Mobility    Modified Rankin (Stroke Patients Only)       Balance Overall balance assessment: Needs assistance;History of Falls Sitting-balance support: Bilateral upper extremity supported;Feet unsupported Sitting balance-Leahy Scale: Fair       Standing balance-Leahy Scale: Poor                      Cognition Arousal/Alertness: Awake/alert Behavior During Therapy: WFL for tasks assessed/performed Overall Cognitive Status: Within Functional Limits for tasks assessed                      Exercises      General Comments        Pertinent Vitals/Pain Pain Assessment: Faces Faces Pain Scale: Hurts even more Pain Location: abdomen Pain Descriptors / Indicators: Sore Pain Intervention(s): Limited activity within patient's tolerance;Repositioned    Home Living                      Prior Function            PT Goals (current goals can now be found in the care plan section) Progress towards PT goals: Progressing toward goals    Frequency    Min 3X/week      PT Plan Current plan remains appropriate    Co-evaluation  End of Session   Activity Tolerance: Patient limited by fatigue;Patient limited by pain Patient left: in chair;with call bell/phone within reach;with chair alarm set;with family/visitor present     Time: [id number] PT Time Calculation (min) (ACUTE ONLY): 23 min  Charges:  $Therapeutic Activity: 8-22 mins                    G Codes:      Weston Anna, MPT Pager: [phone]

## 2016-09-28 NOTE — Progress Notes (Signed)
PROGRESS NOTE    Emily Mills  [id number] DOB: 1930-10-22 DOA: 09/21/2016 PCP: London Pepper, MD   Brief Narrative:  83 year old female with hypertension, melanoma status post resection, GERD, and other comorbidities presented to the ED with worsening diarrhea, generalized weakness, lightheadedness and almost 40 pound weight loss for the past 6 months. Patient was admitted to the hospital in October for worsening symptoms when she was diagnosed with pneumonia and UTI and discharged to rehabilitation. She was then discharged to an assisted-living. GI saw her on 12/28 for ongoing diarrhea, performed an x-ray which was concerning for bowel obstruction. Colonoscopy was recommended but patient refused. Given significant worsening diarrhea and progressive weakness she was brought to the ED. CT abdomen and pelvis showed large heterogenous enhancing mass likely involving the sigmoid colon. Under went Flexible Sigmoidoscopy which revealed likely malignant completely obstructing tumor. General Surgery was consulted and patient to underwent Diverting Loop Colostomy on 09/24/16.   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 While there are extensive diverticular of within the sigmoid colon, I feel this is most likely a colon mass/ tumor. This abuts the bladder. There is gas within the urinary bladder which is presumably from catheterization. If the patient has not been catheterized, this would be concerning for fistula to the adjacent abnormal sigmoid colon. -Pathology  with invasive adenocarcinoma -CCS consulting, prealbumin <5-nutritional status very poor -Diverting loop colostomy done 09/24/16 -C/w Simethicone 80 mg po  -Diet Backed off to Clear Liquids because of increased Nausea and Abdominal Distention -Abdominal Flat Plate ordered by General Surgery and shows ileus? -Pain Control with Ketorolac 15 mg IV q6hprn Severe Pain, Morphine 1-2 mg IV q4hprn Severe Pain, Tramadol 50 mg po q6hprn for Moderate Pain and Zofran for N/V -WOC Nurse consulted for new loop colostomy and saw patient and assessed.  -Continue to Monitor; Per General Surgery patient has Oncology Follow Up arranged -SNF when medically stable for Discharge   Possible UTI (Urinary Tract Infection) -Urinalysis showed TNTC WBC, Large Leukocytes, Many Bacteria, and Negative Nitrite -Patient with Urinary Frequency and Incontinence -Urine Cx shows Multiple Species Present -Changed Abx to IV Levaquin; Course Completed for Tx.    FTT (failure to thrive) in Adult/Severe Protein Calorie Malnutrition -Will hold on PICC as patient is tolerating Diet ant Feeding Supplements with Boost and Ensure; Will Discuss with General Surgery tomorrow -Diet dropped back to Clears because of Nausea and abdominal distention   Oral Thrush -C/w Nystatin 500,000 Units po 4 times Daily -On IV Diflucan 100 mg q24h.  Anemia-Iron deficiency -Likely from GI blood loss/colon CA -Patient's Hb/Hct went from 9.1/29.8 -> 9.7/31.3 -> 9.4/30.4 -> 9.1/29.1 -> 9.5/30.8 -> 9.9/30.7 -Continue to monitor, transfuse if <7 -Repeat CBC in AM  Hypokalemia,  -Patient's K+ was 3.4 -Replete as Necessary -Repeat CMP in AM  Questionable Left Lower Lobe Health Care Associated Pneumonia vs. Atelectasis -CXR 09/17/16 showed Essentially complete clearing of left upper lobe infiltrate compared to 1 month prior. No new opacity. Stable cardiac silhouette. There is aortic atherosclerosis. -Patient had CXR done 09/23/16 that Radiologist  read as  Patchy opacity at the left lung base, new since prior study concerning for pneumonia. Right lung is clear. Heart is mildly enlarged. Trace left pleural effusions suspected -On 08/20/16 Sputum Gram Stain and Cx showed Rare Gram Positive Cocci in Pairs and Cx showed Moderate Candida Albicans -CT Abd/Pelvis on 09/21/16 showed Dependent atelectasis in the lung bases. Trace bilateral pleural effusions. -Changed IV Ceftriaxone to Levaquin -WBC went from 8.8 -> 5.4 -> 5.0 -> 5.6 -> 6.8; Afebrile and not Short of Breath -C/w Incentive Spirometer; Added Flutter Valv -DuoNeb Breathing Tx -Repeat CXR in AM  Fever, Resolved and no reoccurrence  -Patient was febrile the morning of 09/23/16 at 101.7 -Improved with Tylenol -Blood Cx x 2 Showed NGTD at 3 days; Sputum Cx as above and Urine Cx as above -? Source of New PNA on CXR vs. Colonic Mass -Continue to Monitor Vital Signs  Lower Extremity/Pedal Edema -Per Daughter has been going on for months and comes and goes -Possibly related to Low Albumin (1.4) and poor Nutritional Status -Continue to Monitor and no Hx of Heart Failure;  -Gave 40 mg IV Lasix and Re-evaluate; May need to schedule -ECHO Pending -Patient is 14 Liters Positive since Admission  DVT prophylaxis: SCD's; Lovenox 40 mg sq  Code Status: DO NOT RESUSCITATE Family Communication: Daughter at bedside Disposition Plan: SNF when stable  Consultants:   [facility]  General Surgery   Procedures: Colonoscopy with Biopsy; Diverting loop colostomy    Antimicrobials: Empric Ceftriaxone D/C'd 1/9, and Cefotetan IV for Surgical Prohlyaxis -IV Levaquin 1/9 -->   Subjective: Seen and examined at bedside and states she was better today. No Nausea and states she has been urinating a lot. No SOB and states abdomen was more distended yesterday. No complaints today and wants to moblize.   Objective: Vitals:   09/28/16 0934 09/28/16 1400 09/28/16 1923 09/28/16 2056  BP:   138/78  111/74  Pulse: 74 68  100  Resp: 16 18  16   Temp:  98.6 F (37 C)  97.9 F (36.6 C)  TempSrc:  Oral  Axillary  SpO2: 95% 96% 97% 98%  Weight:      Height:        Intake/Output Summary (Last 24 hours) at 09/28/16 2154 Last data filed at 09/28/16 2103  Gross per 24 hour  Intake              880 ml  Output              200 ml  Net              680 ml   Filed Weights   09/22/16 1136  Weight: 69.4 kg (153 lb)   Examination: Physical Exam this AM:  Constitutional: NAD and appears calm and comfortable Eyes: Lids and conjunctivae normal, sclerae anicteric  ENMT: External Ears, Nose appear normal. Slightly hard of hearing.  Neck: Appears normal, supple, no cervical masses, normal ROM, no appreciable thyromegaly, no evidence of JVD Respiratory: Diminished to auscultation bilaterally, no wheezing, rales, rhonchi or crackles. Normal respiratory effort and patient is not tachypenic. No accessory muscle use.  Cardiovascular: RRR, no murmurs / rubs / gallops. S1 and S2 auscultated. 3+ LE Pitting Pedal and extremity edema.  Abdomen: Soft, non-tender, mildly distended. No masses palpated. No appreciable hepatosplenomegaly. Bowel sounds positive x4. Diverting Loop Colostomy in place with some liquid and air in bag.  GU: Deferred. Musculoskeletal: No clubbing / cyanosis of digits/nails. No joint deformity upper and lower extremities.  Skin: No rashes, lesions, ulcers. No induration; Warm and dry.  Neurologic: CN 2-12 grossly intact with no focal deficits. Sensation intact in all 4 Extremities. Romberg sign cerebellar reflexes not assessed.  Psychiatric: Normal judgment and insight. Alert and oriented x 3. Normal mood and appropriate affect.   Data Reviewed: I have personally reviewed following labs and imaging studies  CBC:  Recent Labs Lab 09/24/16 0503 09/25/16 0915 09/26/16 0524 09/27/16 0526 09/28/16 0531  WBC 8.8 5.4 5.0 5.6 6.8  NEUTROABS  --  3.9 3.1 3.9 4.9  HGB 9.7*  9.4* 9.1* 9.5* 9.9*  HCT 31.3* 30.4* 29.1* 30.8* 30.7*  MCV 79.6 80.2 79.9 79.2 76.4*  PLT 266 242 215 221 [id number]   Basic Metabolic Panel:  Recent Labs Lab 09/24/16 0503 09/25/16 0915 09/26/16 0524 09/27/16 0526 09/28/16 0531  NA 138 135 135 135 135  K 3.7 3.4* 3.8 3.6 3.4*  CL 104 104 106 106 107  CO2 22 25 21* 21* 23  GLUCOSE 111* 111* 112* 95 98  BUN 12 14 11 9 10   CREATININE 0.68 0.46 0.37* 0.46 0.42*  CALCIUM 7.7* 7.7* 7.5* 7.5* 7.6*  MG  --  1.8 1.7 1.6* 1.6*  PHOS  --  2.4* 2.4* 2.5 3.0   GFR: Estimated Creatinine Clearance: 47.4 mL/min (by C-G formula based on SCr of 0.42 mg/dL (L)). Liver Function Tests:  Recent Labs Lab 09/22/16 0514 09/25/16 0915 09/26/16 0524 09/27/16 0526 09/28/16 0531  AST 25 31 27 28 21   ALT 11* 14 13* 14 12*  ALKPHOS 153* 172* 170* 208* 190*  BILITOT 0.6 0.3 0.2* 0.3 0.5  PROT 4.9* 5.0* 4.6* 4.4* 4.4*  ALBUMIN 1.6* 1.8* 1.6* 1.4* 1.4*   No results for input(s): LIPASE, AMYLASE in the last 168 hours. No results for input(s): AMMONIA in the last 168 hours. Coagulation Profile:  Recent Labs Lab 09/22/16 0514  INR 1.20   Cardiac Enzymes: No results for input(s): CKTOTAL, CKMB, CKMBINDEX, TROPONINI in the last 168 hours. BNP (last 3 results) No results for input(s): PROBNP in the last 8760 hours. HbA1C: No results for input(s): HGBA1C in the last 72 hours. CBG: No results for input(s): GLUCAP in the last 168 hours. Lipid Profile: No results for input(s): CHOL, HDL, LDLCALC, TRIG, CHOLHDL, LDLDIRECT in the last 72 hours. Thyroid Function Tests: No results for input(s): TSH, T4TOTAL, FREET4, T3FREE, THYROIDAB in the last 72 hours. Anemia Panel: No results for input(s): VITAMINB12, FOLATE, FERRITIN, TIBC, IRON, RETICCTPCT in the last 72 hours. Sepsis Labs: No results for input(s): PROCALCITON, LATICACIDVEN in the last 168 hours.  Recent Results (from the past 240 hour(s))  Urine culture     Status: Abnormal   Collection  Time: 09/21/16  2:19 PM  Result Value Ref Range Status   Specimen Description URINE, CATHETERIZED  Final   Special Requests NONE  Final   Culture MULTIPLE SPECIES PRESENT, SUGGEST RECOLLECTION (A)  Final   Report Status 09/22/2016 FINAL  Final  Culture, blood (routine x 2)     Status: None   Collection Time: 09/23/16  9:50 AM  Result Value Ref Range Status   Specimen Description BLOOD LEFT ARM  Final   Special Requests BOTTLES DRAWN AEROBIC AND ANAEROBIC 5CC  Final   Culture   Final    NO GROWTH 5 DAYS Performed at [facility]    Report Status 09/28/2016 FINAL  Final  Culture, blood (routine x 2)     Status: None   Collection Time: 09/23/16  9:54 AM  Result Value Ref Range Status   Specimen Description BLOOD LEFT HAND  Final   Special Requests IN PEDIATRIC BOTTLE 3CC  Final   Culture   Final    NO GROWTH 5 DAYS Performed at [facility]    Report Status 09/28/2016 FINAL  Final  Surgical pcr screen     Status: Abnormal   Collection Time: 09/24/16  7:24 AM  Result Value Ref Range Status   MRSA, PCR POSITIVE (A) NEGATIVE Final    Comment: RESULT CALLED TO, READ BACK BY AND VERIFIED WITH: HERRSCHAFT,M @ 1301 ON RV:[id number] BY POTEAT,S    Staphylococcus aureus POSITIVE (A) NEGATIVE Final    Comment:        The Xpert SA Assay (FDA approved for NASAL specimens in patients over 51 years of age), is one component of a comprehensive surveillance program.  Test performance has been validated by [facility] for patients greater than or equal to 6 year old. It is not intended to diagnose infection nor to guide or monitor treatment.     Radiology Studies: Dg Chest 2 View  Result Date: 09/27/2016 CLINICAL DATA:  Sigmoid colon cancer with recent resection and loop colostomy. Ileus and failure to thrive. EXAM: CHEST  2 VIEW COMPARISON:  09/23/2016 FINDINGS: Left lower lobe airspace opacity. Possible component of left pleural effusion. Small right pleural effusion with  blunting of the right posterior costophrenic angle. Mitral valve calcification.  Trace fluid in the minor fissure. Atherosclerotic aortic arch. IMPRESSION: 1. Left lower lobe airspace opacities nonspecific, pneumonia is not excluded. There is likely at least a small left pleural effusion, and a trace right pleural effusion is present. 2. Atherosclerotic aortic arch.  Calcified mitral valve. Electronically Signed   By: Van Clines M.D.   On: 09/27/2016 18:09   Dg Abd 2 Views  Result Date: 09/27/2016 CLINICAL DATA:  Invasive sigmoid adenocarcinoma, status post loop colostomy creation 09/24/2016. Lower abdominal pain and nausea. Ileus. EXAM: ABDOMEN - 2 VIEW COMPARISON:  09/21/2016 FINDINGS: Extensive splenic artery tortuosity and calcification. Airspace opacity in the left lower lobe. Dense mitral calcification. Left lower quadrant colostomy. No dilated bowel. No free intraperitoneal gas is currently evident. There is some scattered air-fluid levels in large and small bowel. Suspected subcutaneous edema along the abdominal wall. IMPRESSION: 1. Scattered air-fluid levels in nondilated small bowel and large bowel, possibly from ileus, but no current dilated bowel. 2. Left lower lobe airspace opacity. 3. Mitral valve calcification.  Dense splenic artery calcification. 4. Suspected subcutaneous edema in the abdominal wall adipose tissue. Electronically Signed   By: Van Clines M.D.   On: 09/27/2016 18:06   Scheduled Meds: . amLODipine  2.5 mg Oral Daily  . Chlorhexidine Gluconate Cloth  6 each Topical Once  . Chlorhexidine Gluconate Cloth  6 each Topical Q0600  . enoxaparin (LOVENOX) injection  40 mg Subcutaneous Q24H  . famotidine  20 mg Oral Daily  . fluconazole (DIFLUCAN) IV  100 mg Intravenous Q24H  . fluticasone  1 spray Each Nare Daily  . ipratropium-albuterol  3 mL Nebulization BID  . levofloxacin (LEVAQUIN) IV  750 mg Intravenous Q48H  . lip balm  1 application Topical BID  . nystatin   5 mL Oral QID  . nystatin cream  1 application Topical BID   Continuous Infusions:   LOS: 6 days   Kerney Elbe, DO [facility] Pager [phone]  If 7PM-7AM, please contact night-coverage [email or web address] Password [facility] 09/28/2016, 9:54 PM

## 2016-09-28 NOTE — Progress Notes (Signed)
[facility] Progress Note  4 Days Post-Op  Subjective: C/o 10/10 crampy abdominal pain over right hemiabdomen, improves after pain medications. Pain is intermittent and cramping in nature. Patient said when she has severe pain she feels some nausea, but denies nausea with PO intake and denies emesis. Urinating. Denies stool output in colostomy pouch.  Tachycardic up to 114 bpm overnight - BP and RR are normal, afebrile. Pt is 14+ liters net positive since admission; 20 lbs weight gain  Objective: Vital signs in last 24 hours: Temp:  [97.5 F (36.4 C)-99 F (37.2 C)] 97.5 F (36.4 C) (01/09 0545) Pulse Rate:  [90-114] 100 (01/09 0545) Resp:  [16-18] 18 (01/09 0545) BP: (110-139)/(68-85) 139/85 (01/09 0545) SpO2:  [95 %-97 %] 96 % (01/09 0545) Last BM Date: 09/27/16  Intake/Output from previous day: 01/08 0701 - 01/09 0700 In: 260 [P.O.:60; IV Piggyback:200] Out: 50 [Stool:50] Intake/Output this shift: No intake/output data recorded.  PE: Gen:  Alert, NAD, pleasant and cooperative CV: RRR Pulmonary: non-labored, CTAB, decreased breath sounds in bilateral lung bases Abd: Soft, TTP right lower quadrant, ND, good bowel sounds in all 4 quadrants, incisions C/D/I, colostomy pouch with gas and minimal fluid Ext: 2+ pitting pedal edema  Lab Results:   Recent Labs  09/27/16 0526 09/28/16 0531  WBC 5.6 6.8  HGB 9.5* 9.9*  HCT 30.8* 30.7*  PLT 221 217   BMET  Recent Labs  09/27/16 0526 09/28/16 0531  NA 135 135  K 3.6 3.4*  CL 106 107  CO2 21* 23  GLUCOSE 95 98  BUN 9 10  CREATININE 0.46 0.42*  CALCIUM 7.5* 7.6*   PT/INR No results for input(s): LABPROT, INR in the last 72 hours. CMP     Component Value Date/Time   NA 135 09/28/2016 0531   NA 139 08/24/2016   K 3.4 (L) 09/28/2016 0531   CL 107 09/28/2016 0531   CO2 23 09/28/2016 0531   GLUCOSE 98 09/28/2016 0531   BUN 10 09/28/2016 0531   BUN 7 08/24/2016   CREATININE 0.42 (L) 09/28/2016  0531   CALCIUM 7.6 (L) 09/28/2016 0531   PROT 4.4 (L) 09/28/2016 0531   ALBUMIN 1.4 (L) 09/28/2016 0531   AST 21 09/28/2016 0531   ALT 12 (L) 09/28/2016 0531   ALKPHOS 190 (H) 09/28/2016 0531   BILITOT 0.5 09/28/2016 0531   GFRNONAA >60 09/28/2016 0531   GFRAA >60 09/28/2016 0531   Lipase     Component Value Date/Time   LIPASE 12 09/21/2016 0905       Studies/Results: Dg Chest 2 View  Result Date: 09/27/2016 CLINICAL DATA:  Sigmoid colon cancer with recent resection and loop colostomy. Ileus and failure to thrive. EXAM: CHEST  2 VIEW COMPARISON:  09/23/2016 FINDINGS: Left lower lobe airspace opacity. Possible component of left pleural effusion. Small right pleural effusion with blunting of the right posterior costophrenic angle. Mitral valve calcification.  Trace fluid in the minor fissure. Atherosclerotic aortic arch. IMPRESSION: 1. Left lower lobe airspace opacities nonspecific, pneumonia is not excluded. There is likely at least a small left pleural effusion, and a trace right pleural effusion is present. 2. Atherosclerotic aortic arch.  Calcified mitral valve. Electronically Signed   By: Van Clines M.D.   On: 09/27/2016 18:09   Dg Abd 2 Views  Result Date: 09/27/2016 CLINICAL DATA:  Invasive sigmoid adenocarcinoma, status post loop colostomy creation 09/24/2016. Lower abdominal pain and nausea. Ileus. EXAM: ABDOMEN - 2 VIEW COMPARISON:  09/21/2016 FINDINGS:  Extensive splenic artery tortuosity and calcification. Airspace opacity in the left lower lobe. Dense mitral calcification. Left lower quadrant colostomy. No dilated bowel. No free intraperitoneal gas is currently evident. There is some scattered air-fluid levels in large and small bowel. Suspected subcutaneous edema along the abdominal wall. IMPRESSION: 1. Scattered air-fluid levels in nondilated small bowel and large bowel, possibly from ileus, but no current dilated bowel. 2. Left lower lobe airspace opacity. 3. Mitral  valve calcification.  Dense splenic artery calcification. 4. Suspected subcutaneous edema in the abdominal wall adipose tissue. Electronically Signed   By: Van Clines M.D.   On: 09/27/2016 18:06    Anti-infectives: Anti-infectives    Start     Dose/Rate Route Frequency Ordered Stop   09/24/16 0600  cefoTEtan (CEFOTAN) 2 g in dextrose 5 % 50 mL IVPB     2 g 100 mL/hr over 30 Minutes Intravenous On call to O.R. 09/23/16 1557 09/24/16 1111   09/22/16 1700  fluconazole (DIFLUCAN) IVPB 100 mg     100 mg 50 mL/hr over 60 Minutes Intravenous Every 24 hours 09/21/16 1611     09/21/16 1700  fluconazole (DIFLUCAN) IVPB 200 mg     200 mg 100 mL/hr over 60 Minutes Intravenous  Once 09/21/16 1608 09/21/16 2030   09/21/16 1700  cefTRIAXone (ROCEPHIN) 1 g in dextrose 5 % 50 mL IVPB     1 g 100 mL/hr over 30 Minutes Intravenous Every 24 hours 09/21/16 1608         Assessment/Plan Status post lap loop colostomy for obstructing sigmoid carcinoma - POD#4, Dr. Leighton Ruff  post-operative ileus - increased distention and decreased colostomy gas 1/8; abd film mild ileus             gas in colostomy pouch Pain well controlled - continue to minimize narcotic use             Appreciate WOC assistance with stoma care/education - will need home health RN for ostomy care. Red rubber catheter may be removed on the day of discharge.  PT/OT  IS!   Pneumonia v atelectasis - CXR 1/8 significant for LLL atelectasis vs PNA, suspect atelectasis. Afebrile. No leukocytosis. On empiric abx per medicine Edema - Diuresis per medicine; likely secondary to malnutrition; echocardiogram pending Protein calorie malnutrition - boost/ensure when oral intake allows  FEN: clears ID: Levaquin VTE: lovenox  Plan: some gas in ostomy pouch. Great bowel sounds today. Will start clear liquids + boost per nutrition. Discussed going SLOW with diet with patient and her daughter.   Mobilize with PT!  Incentive spirometry!  SNF at discharge   LOS: 6 days    Jill Alexanders , [facility] 09/28/2016, 8:34 AM Pager: [phone] Consults: [phone] Mon-Fri 7:00 am-4:30 pm Sat-Sun 7:00 am-11:30 am

## 2016-09-28 NOTE — Clinical Social Work Note (Signed)
Patient is currently in co-pay days for SNF coverage. [facility] representative, Shirlean Mylar planning to meet with patient on Wed, 1/10 to assess ability to pay co-pays. Facility rep also advised to contact patient's dtr, Otila Kluver as well.   MSW contacted dtr, Otila Kluver to notify her to contact facility in regards to financial matters.   Patient was at [facility] from 12/2-12/22. Pt's dtr aware that patient is in co-pay days for SNF placement.   Glendon Axe, MSW [phone] 09/28/2016 3:16 PM

## 2016-09-28 NOTE — Progress Notes (Signed)
Occupational Therapy Treatment Patient Details Name: Emily Mills MRN: [id number] DOB: [birth year] Today's Date: 09/28/2016    History of present illness HPI: Emily Mills is a 83 y.o. female with medical history significant of HTN, prior hx of melanoma s/p resection, GERD, presents to the ED with complaints of worsening diarrhea, worsening generalized weakness, lightheadedness and a 40 pound weight loss over the past 6 months.; pt was recentlly admitted with frequent falls. 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   OT comments  Pt able to do more today than yesterday, per daughter.  Needs +2 assistance for transfers:  fatiques easily but tries very hard.  Follow Up Recommendations  SNF    Equipment Recommendations  None recommended by OT    Recommendations for Other Services      Precautions / Restrictions Precautions Precautions: Fall Restrictions Weight Bearing Restrictions: No       Mobility Bed Mobility Overal bed mobility: Needs Assistance Bed Mobility: Supine to Sit Rolling: Mod assist;+2 for physical assistance;+2 for safety/equipment   Supine to sit: Mod assist;+2 for physical assistance;HOB elevated     General bed mobility comments: assist for trunk and bil LEs. Multimodal cueing required. Utilized bedpad for scooting, positioning. Pt somewhat fearfull  Transfers Overall transfer level: Needs assistance Equipment used: Rolling walker (2 wheeled) Transfers: Sit to/from [facility] Sit to Stand: Mod assist;+2 physical assistance;+2 safety/equipment;From elevated surface Stand pivot transfers: Mod assist;+2 physical assistance;+2 safety/equipment       General transfer comment: Sit to stand x3. Assist to rise, stabilize, control descent. Multimodal cueing required. Posterior lean and flexed posture in standing. Cues for safety, posture, use of UEs/proper RW use. Stand pivot, bed to recliner, with RW.     Balance Overall balance assessment: Needs assistance;History of Falls Sitting-balance support: Bilateral upper extremity supported;Feet unsupported Sitting balance-Leahy Scale: Fair       Standing balance-Leahy Scale: Poor                     ADL Overall ADL's : Needs assistance/impaired     Grooming: Set up;Sitting;Brushing hair (cues for completeness)                   Toilet Transfer: Moderate assistance;+2 for safety/equipment;+2 for physical assistance;Stand-pivot;RW (to chair)                    Vision                     Perception     Praxis      Cognition   Behavior During Therapy: [facility] for tasks assessed/performed Overall Cognitive Status: Within Functional Limits for tasks assessed                       Extremity/Trunk Assessment               Exercises     Shoulder Instructions       General Comments      Pertinent Vitals/ Pain       Pain Assessment: Faces Faces Pain Scale: Hurts even more Pain Location: abdomen Pain Descriptors / Indicators: Sore Pain Intervention(s): Limited activity within patient's tolerance;Monitored during session;Repositioned  Home Living  Prior Functioning/Environment              Frequency  Min 2X/week        Progress Toward Goals  OT Goals(current goals can now be found in the care plan section)  Progress towards OT goals: Goals drowngraded-see care plan  Acute Rehab OT Goals OT Goal Formulation: With patient Time For Goal Achievement: 10/07/16 ADL Goals Pt Will Perform Grooming: with min assist;standing Pt Will Perform Lower Body Dressing:  (discontinue) Pt Will Transfer to Toilet: with min assist;bedside commode;stand pivot transfer Pt Will Perform Toileting - Clothing Manipulation and hygiene: with min assist;sit to/from stand  Plan Discharge plan remains appropriate    Co-evaluation     PT/OT/SLP Co-Evaluation/Treatment: Yes Reason for Co-Treatment: For patient/therapist safety PT goals addressed during session: Mobility/safety with mobility OT goals addressed during session: ADL's and self-care      End of Session     Activity Tolerance Patient tolerated treatment well   Patient Left in chair;with call bell/phone within reach;with chair alarm set;with family/visitor present   Nurse Communication Mobility status        Time: [id number] OT Time Calculation (min): 23 min  Charges: OT General Charges $OT Visit: 1 Procedure OT Treatments $Therapeutic Activity: 8-22 mins  [place] Rosamond 09/28/2016, 12:39 PM   Lesle Chris, OTR/L [phone] 09/28/2016

## 2016-09-28 NOTE — Progress Notes (Signed)
  Echocardiogram 2D Echocardiogram has been performed.  Emily Mills 09/28/2016, 2:04 PM

## 2016-09-29 ENCOUNTER — Inpatient Hospital Stay (HOSPITAL_COMMUNITY): Payer: Medicare Other

## 2016-09-29 DIAGNOSIS — K913 Postprocedural intestinal obstruction, unspecified as to partial versus complete: Secondary | ICD-10-CM

## 2016-09-29 DIAGNOSIS — M79609 Pain in unspecified limb: Secondary | ICD-10-CM

## 2016-09-29 DIAGNOSIS — M6281 Muscle weakness (generalized): Secondary | ICD-10-CM

## 2016-09-29 DIAGNOSIS — K567 Ileus, unspecified: Secondary | ICD-10-CM

## 2016-09-29 DIAGNOSIS — K9189 Other postprocedural complications and disorders of digestive system: Secondary | ICD-10-CM

## 2016-09-29 LAB — CBC WITH DIFFERENTIAL/PLATELET
BASOS PCT: 0 %
Basophils Absolute: 0 10*3/uL (ref 0.0–0.1)
Eosinophils Absolute: 0 10*3/uL (ref 0.0–0.7)
Eosinophils Relative: 0 %
HEMATOCRIT: 31.3 % — AB (ref 36.0–46.0)
Hemoglobin: 9.6 g/dL — ABNORMAL LOW (ref 12.0–15.0)
LYMPHS PCT: 20 %
Lymphs Abs: 1.3 10*3/uL (ref 0.7–4.0)
MCH: 24 pg — ABNORMAL LOW (ref 26.0–34.0)
MCHC: 30.7 g/dL (ref 30.0–36.0)
MCV: 78.3 fL (ref 78.0–100.0)
MONO ABS: 0.3 10*3/uL (ref 0.1–1.0)
MONOS PCT: 6 %
NEUTROS ABS: 4.6 10*3/uL (ref 1.7–7.7)
Neutrophils Relative %: 74 %
Platelets: 233 10*3/uL (ref 150–400)
RBC: 4 MIL/uL (ref 3.87–5.11)
RDW: 16.2 % — AB (ref 11.5–15.5)
WBC: 6.2 10*3/uL (ref 4.0–10.5)

## 2016-09-29 LAB — COMPREHENSIVE METABOLIC PANEL
ALT: 12 U/L — ABNORMAL LOW (ref 14–54)
ANION GAP: 5 (ref 5–15)
AST: 19 U/L (ref 15–41)
Albumin: 1.4 g/dL — ABNORMAL LOW (ref 3.5–5.0)
Alkaline Phosphatase: 180 U/L — ABNORMAL HIGH (ref 38–126)
BILIRUBIN TOTAL: 0.3 mg/dL (ref 0.3–1.2)
BUN: 9 mg/dL (ref 6–20)
CO2: 25 mmol/L (ref 22–32)
Calcium: 7.5 mg/dL — ABNORMAL LOW (ref 8.9–10.3)
Chloride: 103 mmol/L (ref 101–111)
Creatinine, Ser: 0.42 mg/dL — ABNORMAL LOW (ref 0.44–1.00)
GFR calc Af Amer: >60 mL/min (ref >60)
GFR calc non Af Amer: >60 mL/min (ref >60)
Glucose, Bld: 93 mg/dL (ref 65–99)
POTASSIUM: 3.9 mmol/L (ref 3.5–5.1)
Sodium: 133 mmol/L — ABNORMAL LOW (ref 135–145)
TOTAL PROTEIN: 4.4 g/dL — AB (ref 6.5–8.1)

## 2016-09-29 LAB — BRAIN NATRIURETIC PEPTIDE: B NATRIURETIC PEPTIDE 5: 202 pg/mL — AB (ref 0.0–100.0)

## 2016-09-29 LAB — MAGNESIUM: MAGNESIUM: 1.7 mg/dL (ref 1.7–2.4)

## 2016-09-29 LAB — PHOSPHORUS: Phosphorus: 3.2 mg/dL (ref 2.5–4.6)

## 2016-09-29 NOTE — Progress Notes (Signed)
**  Preliminary report by tech**  Bilateral lower extremity venous duplex completed. There is no obvious evidence of deep or superficial vein thrombosis involving the right and left lower extremities. All clearly visualized vessels appear patent and compressible. There is no evidence of Baker's cysts bilaterally.  09/29/16 3:43 PM Emily Mills RVT

## 2016-09-29 NOTE — Progress Notes (Signed)
[facility] Progress Note  5 Days Post-Op  Subjective: Looks better today. Intermittent gas pains. Tolerating clears. Reports some nausea last night. Denies nausea/vomiting today. +stool in colostomy pouch. Just ambulated with PT.  Objective: Vital signs in last 24 hours: Temp:  [97.6 F (36.4 C)-98.6 F (37 C)] 97.6 F (36.4 C) (01/10 0612) Pulse Rate:  [68-100] 96 (01/10 0612) Resp:  [14-18] 14 (01/10 0612) BP: (108-138)/(74-82) 108/82 (01/10 0612) SpO2:  [96 %-98 %] 97 % (01/10 0612) Last BM Date: 09/29/16  Intake/Output from previous day: 01/09 0701 - 01/10 0700 In: 990 [P.O.:840; IV Piggyback:150] Out: 175 [Stool:175] Intake/Output this shift: No intake/output data recorded.  PE: Gen: Alert, NAD, pleasant and cooperative CV: RRR Pulmonary: non-labored, CTAB Abd: Soft, appropriately tender, ND, good bowel sounds in all 4 quadrants, incisions C/D/I, colostomy pouch with soft stool Ext: pitting pedal edema  Lab Results:   Recent Labs  09/28/16 0531 09/29/16 0536  WBC 6.8 6.2  HGB 9.9* 9.6*  HCT 30.7* 31.3*  PLT 217 233   BMET  Recent Labs  09/28/16 0531 09/29/16 0536  NA 135 133*  K 3.4* 3.9  CL 107 103  CO2 23 25  GLUCOSE 98 93  BUN 10 9  CREATININE 0.42* 0.42*  CALCIUM 7.6* 7.5*   PT/INR No results for input(s): LABPROT, INR in the last 72 hours. CMP     Component Value Date/Time   NA 133 (L) 09/29/2016 0536   NA 139 08/24/2016   K 3.9 09/29/2016 0536   CL 103 09/29/2016 0536   CO2 25 09/29/2016 0536   GLUCOSE 93 09/29/2016 0536   BUN 9 09/29/2016 0536   BUN 7 08/24/2016   CREATININE 0.42 (L) 09/29/2016 0536   CALCIUM 7.5 (L) 09/29/2016 0536   PROT 4.4 (L) 09/29/2016 0536   ALBUMIN 1.4 (L) 09/29/2016 0536   AST 19 09/29/2016 0536   ALT 12 (L) 09/29/2016 0536   ALKPHOS 180 (H) 09/29/2016 0536   BILITOT 0.3 09/29/2016 0536   GFRNONAA >60 09/29/2016 0536   GFRAA >60 09/29/2016 0536   Lipase     Component Value  Date/Time   LIPASE 12 09/21/2016 0905   Studies/Results: Dg Chest 2 View  Result Date: 09/27/2016 CLINICAL DATA:  Sigmoid colon cancer with recent resection and loop colostomy. Ileus and failure to thrive. EXAM: CHEST  2 VIEW COMPARISON:  09/23/2016 FINDINGS: Left lower lobe airspace opacity. Possible component of left pleural effusion. Small right pleural effusion with blunting of the right posterior costophrenic angle. Mitral valve calcification.  Trace fluid in the minor fissure. Atherosclerotic aortic arch. IMPRESSION: 1. Left lower lobe airspace opacities nonspecific, pneumonia is not excluded. There is likely at least a small left pleural effusion, and a trace right pleural effusion is present. 2. Atherosclerotic aortic arch.  Calcified mitral valve. Electronically Signed   By: Van Clines M.D.   On: 09/27/2016 18:09   Dg Abd 2 Views  Result Date: 09/27/2016 CLINICAL DATA:  Invasive sigmoid adenocarcinoma, status post loop colostomy creation 09/24/2016. Lower abdominal pain and nausea. Ileus. EXAM: ABDOMEN - 2 VIEW COMPARISON:  09/21/2016 FINDINGS: Extensive splenic artery tortuosity and calcification. Airspace opacity in the left lower lobe. Dense mitral calcification. Left lower quadrant colostomy. No dilated bowel. No free intraperitoneal gas is currently evident. There is some scattered air-fluid levels in large and small bowel. Suspected subcutaneous edema along the abdominal wall. IMPRESSION: 1. Scattered air-fluid levels in nondilated small bowel and large bowel, possibly from ileus, but no  current dilated bowel. 2. Left lower lobe airspace opacity. 3. Mitral valve calcification.  Dense splenic artery calcification. 4. Suspected subcutaneous edema in the abdominal wall adipose tissue. Electronically Signed   By: Van Clines M.D.   On: 09/27/2016 18:06    Anti-infectives: Anti-infectives    Start     Dose/Rate Route Frequency Ordered Stop   09/28/16 1000  levofloxacin  (LEVAQUIN) IVPB 750 mg     750 mg 100 mL/hr over 90 Minutes Intravenous Every 48 hours 09/28/16 0905     09/24/16 0600  cefoTEtan (CEFOTAN) 2 g in dextrose 5 % 50 mL IVPB     2 g 100 mL/hr over 30 Minutes Intravenous On call to O.R. 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 Leighton Ruff  post-operative ileus - resolving gas in colostomy pouch  Pain well controlled - continue to minimize narcotic use AppreciateWOC assistance with stoma care/education - will need home health RN for ostomy care. Red rubber catheter may be removed on the day of discharge.             PT/OT             IS!   Pneumonia v atelectasis - CXR 1/8 significant for LLL atelectasis vs PNA, suspect atelectasis. Afebrile. No leukocytosis. On empiric abx per medicine Edema - Diuresis per medicine; likely secondary to malnutrition; echocardiogram pending Protein calorie malnutrition - boost/ensure when oral intake allows  FEN: full liwuid diet ID: Levaquin VTE: lovenox  Plan: bowel function improving, advance diet to full liquids Mobilize with PT SNF at discharge    LOS: 7 days    Jill Alexanders , [facility] 09/29/2016, 9:35 AM Pager: [phone] Consults: [phone] Mon-Fri 7:00 am-4:30 pm Sat-Sun 7:00 am-11:30 am

## 2016-09-29 NOTE — Progress Notes (Signed)
Patient awake resting in bed this morning.  Complaints of increased gas and belching.  Pain medication given.

## 2016-09-29 NOTE — Progress Notes (Signed)
Physical Therapy Treatment Patient Details Name: Emily Mills MRN: [id number] DOB: 05-13-1931 Today's Date: 09/29/2016    History of Present Illness HPI: Emily Mills is a 83 y.o. female with medical history significant of HTN, prior hx of melanoma s/p resection, GERD, presents to the ED with complaints of worsening diarrhea, worsening generalized weakness, lightheadedness and a 40 pound weight loss over the past 6 months.; pt was recentlly admitted with frequent falls. 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    PT Comments    Pt very pleasant and motivated.  Assisted OOB to amb a limited distance required + 2 assist. Pt will need ST Rehab at SNF   Follow Up Recommendations  SNF     Equipment Recommendations  None recommended by PT    Recommendations for Other Services       Precautions / Restrictions Precautions Precautions: Fall Precaution Comments: recent ABD surgery Restrictions Weight Bearing Restrictions: No    Mobility  Bed Mobility Overal bed mobility: Needs Assistance Bed Mobility: Supine to Sit     Supine to sit: Mod assist;+2 for physical assistance;HOB elevated     General bed mobility comments: assist for trunk and bil LEs. Multimodal cueing required. Utilized bedpad for scooting, positioning. Required increased time and repeat VC's to decrease anxiety  Transfers Overall transfer level: Needs assistance Equipment used: Rolling walker (2 wheeled) Transfers: Sit to/from [facility] Sit to Stand: Mod assist;+2 physical assistance;+2 safety/equipment;From elevated surface Stand pivot transfers: Mod assist;+2 physical assistance;+2 safety/equipment       General transfer comment: Sit to stand x3. Assist to rise, stabilize, control descent. Multimodal cueing required. Posterior lean and flexed posture in standing. Cues for safety, posture, use of UEs/proper RW use. Stand pivot, bed to recliner, with  RW.   Ambulation/Gait Ambulation/Gait assistance: Mod assist;+2 physical assistance;+2 safety/equipment Ambulation Distance (Feet): 6 Feet Assistive device: Rolling walker (2 wheeled) Gait Pattern/deviations: Step-to pattern;Decreased step length - right;Decreased step length - left;Decreased stride length;Trunk flexed Gait velocity: decreased   General Gait Details: Assist to stabilize/support pt, maneuver with RW, follow with recliner. Pt fatigues very easily.    Stairs            Wheelchair Mobility    Modified Rankin (Stroke Patients Only)       Balance                                    Cognition Arousal/Alertness: Awake/alert Behavior During Therapy: WFL for tasks assessed/performed Overall Cognitive Status: Within Functional Limits for tasks assessed                      Exercises      General Comments        Pertinent Vitals/Pain Pain Assessment: 0-10 Pain Score: 3  Pain Location: abdomen Pain Descriptors / Indicators: Sore;Tender Pain Intervention(s): Monitored during session    Home Living                      Prior Function            PT Goals (current goals can now be found in the care plan section) Progress towards PT goals: Progressing toward goals    Frequency    Min 3X/week      PT Plan Current plan remains appropriate    Co-evaluation  End of Session Equipment Utilized During Treatment: Gait belt Activity Tolerance: Patient limited by fatigue;Patient limited by pain Patient left: in chair;with call bell/phone within reach;with chair alarm set;with family/visitor present     Time: [id number] PT Time Calculation (min) (ACUTE ONLY): 23 min  Charges:  $Gait Training: 8-22 mins $Therapeutic Activity: 8-22 mins                    G Codes:      Rica Koyanagi  PTA [facility]  Acute  Rehab Pager      [phone]

## 2016-09-29 NOTE — Progress Notes (Signed)
PROGRESS NOTE  Emily Mills [id number] DOB: 03-17-1931 DOA: 09/21/2016 PCP: London Pepper, MD Brief Narrative:  83 year old female with hypertension, melanoma status post resection, GERD, and other comorbidities presented to the ED with worsening diarrhea, generalized weakness, lightheadedness and almost 40 pound weight loss for the past 6 months. Patient was admitted to the hospital in October for worsening symptoms when she was diagnosed with pneumonia and UTI and discharged to rehabilitation. She was then discharged to an assisted-living. GI saw her on 12/28 for ongoing diarrhea, performed an x-ray which was concerning for bowel obstruction. Colonoscopy was recommended but patient refused. Given significant worsening diarrhea and progressive weakness she was brought to the ED. CT abdomen and pelvis showed large heterogenous enhancing mass likely involving the sigmoid colon. Under went Flexible Sigmoidoscopy which revealed likely malignant completely obstructing tumor. General Surgery was consulted and patient to underwent Diverting Loop Colostomy on 09/24/16.   Assessment & Plan:  Colonic Mass consistent with Adenocarcinoma with obstruction s/p Diverting Loop Colostomy POD4 -Complete obstructing tumor in the mid sigmoid colon on Sigmoidoscopy -CT Abd/Pelvis showed "Large heterogeneous enhancing mass like area involving the sigmoid colon. While there are extensive diverticular of within the sigmoid colon, I feel this is most likely a colon mass/ tumor. This abuts the bladder. There is gas within the urinary bladder which is presumably from catheterization." -Pathology with invasive adenocarcinoma -CCS consulting -Diverting loop colostomy done 09/24/16 -C/w Simethicone 80 mg po  -WOC Nurse consulted for new loop colostomy and saw patient and assessed.  -Continue to Monitor; Per General Surgery patient has Oncology Follow Up arranged-->10/14/16 with Dr. Burr Medico -SNF when medically stable  for Discharge  Pyuria -Urinalysis showed TNTC WBC, Large Leukocytes, Many Bacteria, and Negative Nitrite -d/c all abx and observe clinically  FTT (failure to thrive) in Adult/Severe Protein Calorie Malnutrition -Will hold on PICC as patient is tolerating Diet ant Feeding Supplements with Boost and Ensure;  -advancing diet per general surgery  Oral Thrush -C/w Nystatin 500,000 Units po 4 times Daily-->d/c -On IV Diflucan 100 mg q24h.--finished 7 day course  Anemia of chronic disease -Likely from GI blood loss/colon CA -baseline Hgb 9-10 -start iron when taking po consistently -iron saturation 5%  Hypokalemia,  -Replete -Check magnesium  Left Lower Lobe Health Care Associated Pneumonia vs. Atelectasis -Likely atelectasis, not clinically consistent with pneumonia -Patient had CXR done 09/23/16 that Radiologist read as Patchy opacity at the left lung base, new since prior study concerning for pneumonia. Right lung is clear. Heart is mildly enlarged. Trace left pleural effusions suspected -CT Abd/Pelvis on 09/21/16 showed Dependent atelectasis in the lung bases. Trace bilateral pleural effusions. -d/c all antibiotics and observe clinically -Patient is afebrile without leukocyte ptosis or dyspnea -C/w Incentive Spirometer; Added Flutter Valv -DuoNeb Breathing Tx -Repeat CXR--LLL opacity with small bilateral effusions.  Fever, Resolved and no reoccurrence  -Patient was febrile the morning of 09/23/16 at 101.7 -Improved with Tylenol -Blood Cx x 2 Showed NGTD at 3 days; Sputum Cx as above and Urine Cx as above -Continue to Monitor Vital Signs  Lower Extremity/Pedal Edema -Per Daughter has been going on for months and comes and goes -related to Low Albumin (1.4) and poor Nutritional Status -urine protein/creatinine ratio -Gave 40 mg IV Lasix 09/28/16 -ECHO --EF 55-60%, grade 1 daily, no WMA -Patient is 15 Liters Positive since Admission -long discussion with daughter--she  requests po lasix as she feels patient's leg "heaviness" and edema may be contributing  to her poor ambulation -start lasix 40 mg po daily and monitor BMP -Venous duplex negative for DVT  DVT prophylaxis: SCD's; Lovenox 40 mg sq  Code Status: DO NOT RESUSCITATE Family Communication: Daughter updated on phone 09/29/16--Total time spent 35 minutes.  Greater than 50% spent face to face counseling and coordinating care.  Disposition Plan: SNF when cleared by surgery  Consultants:   [facility]  General Surgery   Procedures: Colonoscopy with Biopsy; Diverting loop colostomy    Antimicrobials: Empric Ceftriaxone D/C'd 1/9, and Cefotetan IV for Surgical Prohlyaxis -IV Levaquin 1/9 --> 1/10    Subjective: Patient denies fevers, chills, headache, chest pain, dyspnea, nausea, vomiting, diarrhea, abdominal pain, dysuria, hematuria, hematochezia, and melena.   Objective: Vitals:   09/28/16 1923 09/28/16 2056 09/29/16 0612 09/29/16 1400  BP:  111/74 108/82 125/75  Pulse:  100 96 (!) 114  Resp:  16 14 16   Temp:  97.9 F (36.6 C) 97.6 F (36.4 C) 97.9 F (36.6 C)  TempSrc:  Axillary Oral Oral  SpO2: 97% 98% 97% 95%  Weight:      Height:        Intake/Output Summary (Last 24 hours) at 09/29/16 1757 Last data filed at 09/29/16 1400  Gross per 24 hour  Intake             1080 ml  Output              125 ml  Net              955 ml   Weight change:  Exam:   General:  Pt is alert, follows commands appropriately, not in acute distress  HEENT: No icterus, No thrush, No neck mass, [place]/AT  Cardiovascular: RRR, S1/S2, no rubs, no gallops  Respiratory: Bibasilar crackles. No wheezing. Good air movement.  Abdomen: Soft/+BS, non tender, non distended, no guarding  Extremities: 2 + LE edema, No lymphangitis, No petechiae, No rashes, no synovitis   Data Reviewed: I have personally reviewed following labs and imaging studies Basic Metabolic Panel:  Recent  Labs Lab 09/25/16 0915 09/26/16 0524 09/27/16 0526 09/28/16 0531 09/29/16 0536  NA 135 135 135 135 133*  K 3.4* 3.8 3.6 3.4* 3.9  CL 104 106 106 107 103  CO2 25 21* 21* 23 25  GLUCOSE 111* 112* 95 98 93  BUN 14 11 9 10 9   CREATININE 0.46 0.37* 0.46 0.42* 0.42*  CALCIUM 7.7* 7.5* 7.5* 7.6* 7.5*  MG 1.8 1.7 1.6* 1.6* 1.7  PHOS 2.4* 2.4* 2.5 3.0 3.2   Liver Function Tests:  Recent Labs Lab 09/25/16 0915 09/26/16 0524 09/27/16 0526 09/28/16 0531 09/29/16 0536  AST 31 27 28 21 19   ALT 14 13* 14 12* 12*  ALKPHOS 172* 170* 208* 190* 180*  BILITOT 0.3 0.2* 0.3 0.5 0.3  PROT 5.0* 4.6* 4.4* 4.4* 4.4*  ALBUMIN 1.8* 1.6* 1.4* 1.4* 1.4*   No results for input(s): LIPASE, AMYLASE in the last 168 hours. No results for input(s): AMMONIA in the last 168 hours. Coagulation Profile: No results for input(s): INR, PROTIME in the last 168 hours. CBC:  Recent Labs Lab 09/25/16 0915 09/26/16 0524 09/27/16 0526 09/28/16 0531 09/29/16 0536  WBC 5.4 5.0 5.6 6.8 6.2  NEUTROABS 3.9 3.1 3.9 4.9 4.6  HGB 9.4* 9.1* 9.5* 9.9* 9.6*  HCT 30.4* 29.1* 30.8* 30.7* 31.3*  MCV 80.2 79.9 79.2 76.4* 78.3  PLT 242 215 221 217 233   Cardiac Enzymes: No results for input(s): CKTOTAL,  CKMB, CKMBINDEX, TROPONINI in the last 168 hours. BNP: Invalid input(s): POCBNP CBG: No results for input(s): GLUCAP in the last 168 hours. HbA1C: No results for input(s): HGBA1C in the last 72 hours. Urine analysis:    Component Value Date/Time   COLORURINE YELLOW 09/21/2016 1419   APPEARANCEUR TURBID (A) 09/21/2016 1419   LABSPEC >1.046 (H) 09/21/2016 1419   PHURINE 7.0 09/21/2016 1419   GLUCOSEU NEGATIVE 09/21/2016 1419   HGBUR MODERATE (A) 09/21/2016 1419   BILIRUBINUR NEGATIVE 09/21/2016 1419   KETONESUR NEGATIVE 09/21/2016 1419   PROTEINUR 100 (A) 09/21/2016 1419   UROBILINOGEN 0.2 11/11/2010 0400   NITRITE NEGATIVE 09/21/2016 1419   LEUKOCYTESUR LARGE (A) 09/21/2016 1419   Sepsis  Labs: @LABRCNTIP (procalcitonin:4,lacticidven:4) ) Recent Results (from the past 240 hour(s))  Urine culture     Status: Abnormal   Collection Time: 09/21/16  2:19 PM  Result Value Ref Range Status   Specimen Description URINE, CATHETERIZED  Final   Special Requests NONE  Final   Culture MULTIPLE SPECIES PRESENT, SUGGEST RECOLLECTION (A)  Final   Report Status 09/22/2016 FINAL  Final  Culture, blood (routine x 2)     Status: None   Collection Time: 09/23/16  9:50 AM  Result Value Ref Range Status   Specimen Description BLOOD LEFT ARM  Final   Special Requests BOTTLES DRAWN AEROBIC AND ANAEROBIC 5CC  Final   Culture   Final    NO GROWTH 5 DAYS Performed at [facility]    Report Status 09/28/2016 FINAL  Final  Culture, blood (routine x 2)     Status: None   Collection Time: 09/23/16  9:54 AM  Result Value Ref Range Status   Specimen Description BLOOD LEFT HAND  Final   Special Requests IN PEDIATRIC BOTTLE 3CC  Final   Culture   Final    NO GROWTH 5 DAYS Performed at [facility]    Report Status 09/28/2016 FINAL  Final  Surgical pcr screen     Status: Abnormal   Collection Time: 09/24/16  7:24 AM  Result Value Ref Range Status   MRSA, PCR POSITIVE (A) NEGATIVE Final    Comment: RESULT CALLED TO, READ BACK BY AND VERIFIED WITH: HERRSCHAFT,M @ 1301 ON RV:[id number] BY POTEAT,S    Staphylococcus aureus POSITIVE (A) NEGATIVE Final    Comment:        The Xpert SA Assay (FDA approved for NASAL specimens in patients over 25 years of age), is one component of a comprehensive surveillance program.  Test performance has been validated by [facility] for patients greater than or equal to 60 year old. It is not intended to diagnose infection nor to guide or monitor treatment.      Scheduled Meds: . Chlorhexidine Gluconate Cloth  6 each Topical Once  . enoxaparin (LOVENOX) injection  40 mg Subcutaneous Q24H  . famotidine  20 mg Oral Daily  . fluticasone  1 spray Each  Nare Daily  . ipratropium-albuterol  3 mL Nebulization BID  . lip balm  1 application Topical BID  . nystatin  5 mL Oral QID  . nystatin cream  1 application Topical BID   Continuous Infusions:  Procedures/Studies: Dg Chest 2 View  Result Date: 09/27/2016 CLINICAL DATA:  Sigmoid colon cancer with recent resection and loop colostomy. Ileus and failure to thrive. EXAM: CHEST  2 VIEW COMPARISON:  09/23/2016 FINDINGS: Left lower lobe airspace opacity. Possible component of left pleural effusion. Small right pleural effusion with blunting of  the right posterior costophrenic angle. Mitral valve calcification.  Trace fluid in the minor fissure. Atherosclerotic aortic arch. IMPRESSION: 1. Left lower lobe airspace opacities nonspecific, pneumonia is not excluded. There is likely at least a small left pleural effusion, and a trace right pleural effusion is present. 2. Atherosclerotic aortic arch.  Calcified mitral valve. Electronically Signed   By: Van Clines M.D.   On: 09/27/2016 18:09   Dg Chest 2 View  Result Date: 09/23/2016 CLINICAL DATA:  Fever, weakness.  Recent pneumonia. EXAM: CHEST  2 VIEW COMPARISON:  09/17/2016 FINDINGS: Patchy opacity at the left lung base, new since prior study concerning for pneumonia. Right lung is clear. Heart is mildly enlarged. Trace left pleural effusions suspected. IMPRESSION: New left lower lobe opacity concerning for pneumonia. Trace left pleural effusion. Electronically Signed   By: Rolm Baptise M.D.   On: 09/23/2016 18:36   Dg Chest 2 View  Result Date: 09/17/2016 CLINICAL DATA:  Recent pneumonia EXAM: CHEST  2 VIEW COMPARISON:  Chest radiograph August 18, 2016 and chest CT August 18, 2016 FINDINGS: There has been virtually complete clearing of infiltrate from the left upper lobe. Minimal atelectasis remains in this area currently. The lungs elsewhere clear. Heart size and pulmonary vascularity are normal. No adenopathy. There is atherosclerotic  calcification aorta. There is also calcification of the mitral annulus. There is degenerative change in the thoracic spine. IMPRESSION: Essentially complete clearing of left upper lobe infiltrate compared to 1 month prior. No new opacity. Stable cardiac silhouette. There is aortic atherosclerosis. Electronically Signed   By: Lowella Grip III M.D.   On: 09/17/2016 12:33   Ct Abdomen Pelvis W Contrast  Result Date: 09/21/2016 CLINICAL DATA:  Lower abdominal pain, vomiting. EXAM: CT ABDOMEN AND PELVIS WITH CONTRAST TECHNIQUE: Multidetector CT imaging of the abdomen and pelvis was performed using the standard protocol following bolus administration of intravenous contrast. CONTRAST:  178mL ISOVUE-300 IOPAMIDOL (ISOVUE-300) INJECTION 61% COMPARISON:  11/11/2010 FINDINGS: Lower chest: Mild cardiomegaly. Dense mitral valve annular calcifications. Dependent atelectasis in the lung bases. Trace bilateral pleural effusions. Hepatobiliary: Prior cholecystectomy. Common bile duct and intrahepatic ducts are slightly prominent, likely related to post cholecystectomy state. Common bile duct 13 mm in the pancreatic head. Pancreas: Diffuse pancreatic atrophy. No duct dilatation or focal abnormality. Dense calcifications throughout the splenic vein in the region of the pancreas. Spleen: Small low-density areas in the spleen are stable since prior study, likely small cysts. Adrenals/Urinary Tract: 3.5 cm cyst in the midpole of the right kidney, slightly larger than prior study. No stones or hydronephrosis. Adrenal glands unremarkable. Gas noted within the urinary bladder, presumably from recent catheterization. Stomach/Bowel: Sigmoid diverticulosis. There is a large mass like heterogeneous area in the sigmoid colon with surrounding inflammatory change. This is concerning for large colonic mass/ tumor. This is difficult to measure due to irregular margins and diffuse nature of the process. Heterogeneous enhancing area on image  71 measures 10.1 x 5.9 cm. This masslike area abuts the superior aspect of the urinary bladder. Large stool burden noted throughout the remainder of the colon. Stomach and small bowel are decompressed. Vascular/Lymphatic: Aorta is tortuous as are the iliac vessels. No aneurysm. No adenopathy. Reproductive: Uterus and adnexa unremarkable. Calcifications throughout the uterine wall. Other: Small amount of free fluid in the pelvis.  No free air. Musculoskeletal: No acute bony abnormality. Degenerative disc and facet disease throughout the lumbar spine. IMPRESSION: Large heterogeneous enhancing mass like area involving the sigmoid colon. While there are extensive  diverticular of within the sigmoid colon, I feel this is most likely a colon mass/ tumor. This abuts the bladder. There is gas within the urinary bladder which is presumably from catheterization. If the patient has not been catheterized, this would be concerning for fistula to the adjacent abnormal sigmoid colon. Small stable cystic areas within the spleen. Prior cholecystectomy. Dilated common bile duct and intrahepatic ducts likely related to post cholecystectomy state. Aorta iliac atherosclerosis. Dependent atelectasis in the lung bases. Trace bilateral pleural effusions. Electronically Signed   By: Rolm Baptise M.D.   On: 09/21/2016 12:41   Dg Abd 2 Views  Result Date: 09/27/2016 CLINICAL DATA:  Invasive sigmoid adenocarcinoma, status post loop colostomy creation 09/24/2016. Lower abdominal pain and nausea. Ileus. EXAM: ABDOMEN - 2 VIEW COMPARISON:  09/21/2016 FINDINGS: Extensive splenic artery tortuosity and calcification. Airspace opacity in the left lower lobe. Dense mitral calcification. Left lower quadrant colostomy. No dilated bowel. No free intraperitoneal gas is currently evident. There is some scattered air-fluid levels in large and small bowel. Suspected subcutaneous edema along the abdominal wall. IMPRESSION: 1. Scattered air-fluid levels in  nondilated small bowel and large bowel, possibly from ileus, but no current dilated bowel. 2. Left lower lobe airspace opacity. 3. Mitral valve calcification.  Dense splenic artery calcification. 4. Suspected subcutaneous edema in the abdominal wall adipose tissue. Electronically Signed   By: Van Clines M.D.   On: 09/27/2016 18:06   Dg Abd 2 Views  Result Date: 09/17/2016 CLINICAL DATA:  Abdominal bloating EXAM: ABDOMEN - 2 VIEW COMPARISON:  CT abdomen and pelvis November 11, 2010 FINDINGS: Supine and upright images were obtained. There is diffuse stool throughout the colon. There is no bowel dilatation or air-fluid level suggesting bowel obstruction. No free air. There is extensive arterial vascular calcification in the aorta, common iliac arteries, and splenic artery. There are surgical clips in the right upper quadrant. There are scattered contrast filled colonic diverticulum. IMPRESSION: Diffuse stool throughout colon. No bowel obstruction or free air. Extensive aortoiliac and splenic artery atherosclerosis. Electronically Signed   By: Lowella Grip III M.D.   On: 09/17/2016 12:31    Alicianna Litchford, DO  [facility] Pager [phone]  If 7PM-7AM, please contact night-coverage [email or web address] Password [email or web address] 09/29/2016, 5:57 PM   LOS: 7 days

## 2016-09-29 NOTE — Consult Note (Signed)
[facility] Nurse ostomy follow up Stoma type/location: LLQ loop colostomy with red rubber catheter bridge intact.  Plan to remove bridge on Friday, by taking off pouch and cutting catheter, then sliding out gently.  If patient to be discharged on Thursday, please notify [facility] nurse so that we can discontinue bridge prior to departure. Stomal assessment/size: 1 and 3/4 inch, red, edematous.  Proximal end at 12 o'clock Peristomal assessment: intact, clear. Crease noted along lower margin of stoma from 5-7 o'clock (may be due to positioning as she is sitting up in chair at the time of my assessment). Treatment options for stomal/peristomal skin: Skin barrier ring Output: moderate amount of brown liquid stool in pouch Ostomy pouching: 2pc. 2 and 3/4 inch pouching system with skin barrier ring.  5 pouching "set-ups" in room (pouches, skin barriers and skin barrier rings).  Patient to be discharge to SNF following this admission.  Daughter has not been present for teaching session on pouch changing.  Has book. Education provided: Patient is asking questions, but does not appear to process answers.  Is most comforted by the fact that I tell her that the ostomy looks good.  She repeats three times "where I am going they will take care of this".  Asks me again before I leave if she is "all right". Enrolled patient in [facility] Discharge program:Yes, today. [facility] nursing team will follow until discharge, and will remain available to this patient, her daughter, the nursing, surgical and medical teams.   Thanks, Maudie Flakes, MSN, RN, [place], Arther Abbott  Pager# [phone]

## 2016-09-30 DIAGNOSIS — D638 Anemia in other chronic diseases classified elsewhere: Secondary | ICD-10-CM

## 2016-09-30 DIAGNOSIS — E43 Unspecified severe protein-calorie malnutrition: Secondary | ICD-10-CM

## 2016-09-30 DIAGNOSIS — C187 Malignant neoplasm of sigmoid colon: Principal | ICD-10-CM

## 2016-09-30 DIAGNOSIS — J189 Pneumonia, unspecified organism: Secondary | ICD-10-CM

## 2016-09-30 DIAGNOSIS — K639 Disease of intestine, unspecified: Secondary | ICD-10-CM

## 2016-09-30 DIAGNOSIS — B370 Candidal stomatitis: Secondary | ICD-10-CM

## 2016-09-30 LAB — BASIC METABOLIC PANEL
Anion gap: 4 — ABNORMAL LOW (ref 5–15)
BUN: 10 mg/dL (ref 6–20)
CHLORIDE: 101 mmol/L (ref 101–111)
CO2: 25 mmol/L (ref 22–32)
CREATININE: 0.43 mg/dL — AB (ref 0.44–1.00)
Calcium: 7.5 mg/dL — ABNORMAL LOW (ref 8.9–10.3)
GFR calc Af Amer: >60 mL/min (ref >60)
GFR calc non Af Amer: >60 mL/min (ref >60)
Glucose, Bld: 101 mg/dL — ABNORMAL HIGH (ref 65–99)
POTASSIUM: 3.7 mmol/L (ref 3.5–5.1)
Sodium: 130 mmol/L — ABNORMAL LOW (ref 135–145)

## 2016-09-30 LAB — TSH: TSH: 0.653 u[IU]/mL (ref 0.350–4.500)

## 2016-09-30 NOTE — Clinical Social Work Note (Signed)
Patient has bed offer from [facility] for STR and not LTC. Financial matters were between patient's daughter, Otila Kluver and facility representative, Shirlean Mylar.   Facility is prepared to admit patient today, 1/11 however patient and/or dtr, Otila Kluver will have to pay two weeks of co-pays in advance at $3,300 for $165 daily co-pays.   MSW attempted to contact patient's dtr, Otila Kluver and left a message for a returned phone call.   Glendon Axe, MSW [phone] 09/30/2016 12:36 PM

## 2016-09-30 NOTE — Discharge Instructions (Signed)
Your appointment is at 2:50 PM on 1/29, please arrive at least 30 min before your appointment to complete your check in paperwork.  If you are unable to arrive 30 min prior to your appointment time we may have to cancel or reschedule you.  LAPAROSCOPIC SURGERY: POST OP INSTRUCTIONS  1. DIET: Follow a light bland diet the first 24 hours after arrival home, such as soup, liquids, crackers, etc. Be sure to include lots of fluids daily. Avoid fast food or heavy meals as your are more likely to get nauseated. Eat a low fat the next few days after surgery.  2. Take your usually prescribed home medications unless otherwise directed. 3. PAIN CONTROL:  1. Pain is best controlled by a usual combination of three different methods TOGETHER:  1. Ice/Heat 2. Over the counter pain medication 3. Prescription pain medication 2. Most patients will experience some swelling and bruising around the incisions. Ice packs or heating pads (30-60 minutes up to 6 times a day) will help. Use ice for the first few days to help decrease swelling and bruising, then switch to heat to help relax tight/sore spots and speed recovery. Some people prefer to use ice alone, heat alone, alternating between ice & heat. Experiment to what works for you. Swelling and bruising can take several weeks to resolve.  3. It is helpful to take an over-the-counter pain medication regularly for the first few weeks. Choose one of the following that works best for you:  1. Naproxen (Aleve, etc) Two 220mg  tabs twice a day 2. Ibuprofen (Advil, etc) Three 200mg  tabs four times a day (every meal & bedtime) 3. Acetaminophen (Tylenol, etc) 500-650mg  four times a day (every meal & bedtime) 4. A prescription for pain medication (such as oxycodone, hydrocodone, etc) should be given to you upon discharge. Take your pain medication as prescribed.  1. If you are having problems/concerns with the prescription medicine (does not control pain, nausea, vomiting, rash,  itching, etc), please call us [phone] to see if we need to switch you to a different pain medicine that will work better for you and/or control your side effect better. 2. If you need a refill on your pain medication, please contact your pharmacy. They will contact our office to request authorization. Prescriptions will not be filled after 5 pm or on week-ends. 4. Avoid getting constipated. Between the surgery and the pain medications, it is common to experience some constipation. Increasing fluid intake and taking a fiber supplement (such as Metamucil, Citrucel, FiberCon, MiraLax, etc) 1-2 times a day regularly will usually help prevent this problem from occurring. A mild laxative (prune juice, Milk of Magnesia, MiraLax, etc) should be taken according to package directions if there are no bowel movements after 48 hours.  5. Watch out for diarrhea. If you have many loose bowel movements, simplify your diet to bland foods & liquids for a few days. Stop any stool softeners and decrease your fiber supplement. Switching to mild anti-diarrheal medications (Kayopectate, Pepto Bismol) can help. If this worsens or does not improve, please call us. 6. Wash / shower every day. You may shower over the dressings as they are waterproof. Continue to shower over incision(s) after the dressing is off. 7. Remove your waterproof bandages 5 days after surgery. You may leave the incision open to air. You may replace a dressing/Band-Aid to cover the incision for comfort if you wish.  8. ACTIVITIES as tolerated:  1. You may resume regular (light) daily activities beginning the  next day--such as daily self-care, walking, climbing stairs--gradually increasing activities as tolerated. If you can walk 30 minutes without difficulty, it is safe to try more intense activity such as jogging, treadmill, bicycling, low-impact aerobics, swimming, etc. 2. Save the most intensive and strenuous activity for last such as sit-ups, heavy  lifting, contact sports, etc Refrain from any heavy lifting or straining until you are off narcotics for pain control.  3. DO NOT PUSH THROUGH PAIN. Let pain be your guide: If it hurts to do something, don't do it. Pain is your body warning you to avoid that activity for another week until the pain goes down. 4. You may drive when you are no longer taking prescription pain medication, you can comfortably wear a seatbelt, and you can safely maneuver your car and apply brakes. 5. You may have sexual intercourse when it is comfortable.  9. FOLLOW UP in our office  1. Please call [facility] at [phone] to set up an appointment to see your surgeon in the office for a follow-up appointment approximately 2-3 weeks after your surgery. 2. Make sure that you call for this appointment the day you arrive home to insure a convenient appointment time.      10. IF YOU HAVE DISABILITY OR FAMILY LEAVE FORMS, BRING THEM TO THE               OFFICE FOR PROCESSING.   WHEN TO CALL us [phone]:  1. Poor pain control 2. Reactions / problems with new medications (rash/itching, nausea, etc)  3. Fever over 101.5 F (38.5 C) 4. Inability to urinate 5. Nausea and/or vomiting 6. Worsening swelling or bruising 7. Continued bleeding from incision. 8. Increased pain, redness, or drainage from the incision  The clinic staff is available to answer your questions during regular business hours (8:30am-5pm). Please dont hesitate to call and ask to speak to one of our nurses for clinical concerns.  If you have a medical emergency, go to the nearest emergency room or call 911.  A surgeon from [facility] is always on call at the [facility], [address], [place], [place], [place] [id number] ?  MAIN: [phone] ? TOLL FREE: [phone] ?  FAX [phone]  [email or web address]   Colostomy, Adult, Care After Refer to this sheet in the next few weeks.  These instructions provide you with information about caring for yourself after your procedure. Your health care provider may also give you more specific instructions. Your treatment has been planned according to current medical practices, but problems sometimes occur. Call your health care provider if you have any problems or questions after your procedure. What can I expect after the procedure? After the procedure, it is common to have:  Swelling at the opening that was created during the procedure (stoma).  Slight bleeding around the stoma.  Redness around the stoma. Follow these instructions at home: Activity  Rest as needed while the stoma area heals.  Return to your normal activities as told by your health care provider. Ask your health care provider what activities are safe for you.  Avoid strenuous activity and abdominal exercises for 3 weeks or for as long as told by your health care provider.  Do not lift anything that is heavier than 10 lb (4.5 kg). Incision care  Follow instructions from your health care provider about how to take care of your incision. Make sure you:  Wash your hands with soap  and water before you change your bandage (dressing). If soap and water are not available, use hand sanitizer.  Change your dressing as told by your health care provider.  Leave stitches (sutures), skin glue, or adhesive strips in place. These skin closures may need to stay in place for 2 weeks or longer. If adhesive strip edges start to loosen and curl up, you may trim the loose edges. Do not remove adhesive strips completely unless your health care provider tells you to do that. Stoma Care  Keep the stoma area clean.  Clean and dry the skin around the stoma each time you change the colostomy bag. To clean the stoma area:  Use warm water and only use cleansers that are recommended by your health care provider.  Rinse the stoma area with plain water.  Dry the area well.  Use  stoma powder or ointment on your skin only as told by your health care provider. Do not use any other powders, gels, wipes, or creams on your skin.  Check the stoma area every day for signs of infection. Check for:  More redness, swelling, or pain.  More fluid or blood.  Pus or warmth.  Measure the stoma opening regularly and record the size. Watch for changes. Share this information with your health care provider. Bathing  Do not take baths, swim, or use a hot tub until your health care provider approves. Ask your health care provider if you can take showers. You may be able to shower with or without the colostomy bag in place. If you bathe with the bag on, dry the bag afterward.  Avoid using harsh or oily soaps when you bathe. Colostomy Bag Care  Follow instructions from your health care provider about how to empty or change the colostomy bag.  Keep colostomy supplies with you at all times.  Store all supplies in a cool, dry place.  Empty the colostomy bag:  Whenever it is one-third to one-half full.  At bedtime.  Replace the bag every 2-4 days or as told by your health care provider. Driving  Do not drive for 24 hours if you received a sedative.  Do not drive or operate heavy machinery while taking prescription pain medicine. General instructions  Follow instructions from your health care provider about eating or drinking restrictions.  Take over-the-counter and prescription medicines only as told by your health care provider.  Avoid wearing clothes that are tight directly over your stoma.  Do not use any tobacco products, such as cigarettes, chewing tobacco, and e-cigarettes. If you need help quitting, ask your health care provider.  (Women) Ask your health care provider about becoming pregnant and about using birth control. Medicines may not be absorbed normally after the procedure.  Keep all follow-up visits as told by your health care provider. This is  important. Contact a health care provider if:  You are having trouble caring for your stoma or changing the colostomy bag.  You feel nauseous or you vomit.  You have a fever.  You havemore redness, swelling, or pain at the site of your stoma or around your anus.  You have more fluid or blood coming from your stoma or your anus.  Your stoma area feels warm to the touch.  You have pus coming from your stoma.  You notice a change in the size or appearance of the stoma.  You have abdominal pain, bloating, pressure, or cramping.  Your have stool more often or less often than your health  care provider tells you to expect.  You are not making much urine. This may be a sign of dehydration. Get help right away if:  Your abdominal pain does not go away or it becomes severe.  You keep vomiting.  Your stool is not draining through the stoma.  You have chest pain or an irregular heartbeat. This information is not intended to replace advice given to you by your health care provider. Make sure you discuss any questions you have with your health care provider. Document Released: 01/27/2011 Document Revised: 01/15/2016 Document Reviewed: 05/20/2015 Elsevier Interactive Patient Education  2017 [employer].

## 2016-09-30 NOTE — Progress Notes (Signed)
Physical Therapy Treatment Patient Details Name: Emily Mills MRN: [id number] DOB: 1930/11/22 Today's Date: 09/30/2016    History of Present Illness HPI: Emily Mills is a 83 y.o. female with medical history significant of HTN, prior hx of melanoma s/p resection, GERD, presents to the ED with complaints of worsening diarrhea, worsening generalized weakness, lightheadedness and a 40 pound weight loss over the past 6 months.; pt was recentlly admitted with frequent falls. 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    PT Comments    Assisted OOB to amb to bathroom then back to bed.  Pt c/o increased ABD pain and bloating.    Follow Up Recommendations  SNF     Equipment Recommendations       Recommendations for Other Services       Precautions / Restrictions Precautions Precautions: Fall Precaution Comments: recent ABD surgery with new colostomy Restrictions Weight Bearing Restrictions: No    Mobility  Bed Mobility Overal bed mobility: Needs Assistance Bed Mobility: Supine to Sit;Sit to Supine     Supine to sit: Mod assist;+2 for physical assistance;HOB elevated Sit to supine: Mod assist;Max assist;+2 for physical assistance   General bed mobility comments: assisted OOB and back into bed with increased ABD pain and bloating this session  Transfers Overall transfer level: Needs assistance Equipment used: Rolling walker (2 wheeled) Transfers: Sit to/from [facility] Sit to Stand: Mod assist;+2 physical assistance;+2 safety/equipment;From elevated surface Stand pivot transfers: Mod assist;+2 physical assistance;+2 safety/equipment       General transfer comment: Sit to stand x3. Assist to rise, stabilize, control descent. Multimodal cueing required. Posterior lean and flexed posture in standing. Cues for safety, posture, use of UEs/proper RW use. Stand pivot, bed to recliner, with RW.    Ambulation/Gait Ambulation/Gait assistance: Mod assist;+2 physical assistance;+2 safety/equipment Ambulation Distance (Feet): 6 Feet Assistive device: Rolling walker (2 wheeled) Gait Pattern/deviations: Step-to pattern;Decreased step length - right;Decreased step length - left;Decreased stride length;Trunk flexed Gait velocity: decreased   General Gait Details: Assist to stabilize/support pt, maneuver with RW, follow with recliner. Pt fatigues very easily.  Only tolerated amb to and from the bathroom.     Stairs            Wheelchair Mobility    Modified Rankin (Stroke Patients Only)       Balance                                    Cognition Arousal/Alertness: Awake/alert Behavior During Therapy: WFL for tasks assessed/performed Overall Cognitive Status: Within Functional Limits for tasks assessed                      Exercises      General Comments        Pertinent Vitals/Pain Pain Assessment: Faces Faces Pain Scale: Hurts little more Pain Location: abdomen Pain Descriptors / Indicators: Sore;Tender Pain Intervention(s): Monitored during session    Home Living                      Prior Function            PT Goals (current goals can now be found in the care plan section) Progress towards PT goals: Progressing toward goals    Frequency    Min 3X/week      PT Plan Current plan remains appropriate  Co-evaluation             End of Session Equipment Utilized During Treatment: Gait belt Activity Tolerance: Patient limited by fatigue;Patient limited by pain Patient left: in chair;with call bell/phone within reach;with chair alarm set;with family/visitor present     Time: [id number] PT Time Calculation (min) (ACUTE ONLY): 31 min  Charges:  $Gait Training: 8-22 mins $Therapeutic Activity: 8-22 mins                    G Codes:      Rica Koyanagi  PTA [facility]  Acute  Rehab Pager      [phone]

## 2016-09-30 NOTE — Clinical Social Work Note (Signed)
SNF referral faxed to [facility] as requested by dtr, Otila Kluver per facility representative.   Glendon Axe, MSW [phone] 09/30/2016 3:15 PM

## 2016-09-30 NOTE — Progress Notes (Signed)
[facility] Progress Note  6 Days Post-Op  Subjective: Resting comfortable in bed, asking to sit up and eat breakfast. Reports some nausea overnight that has since resolved. Denies complications with colostomy overnight.  Objective: Vital signs in last 24 hours: Temp:  [97.9 F (36.6 C)-98.5 F (36.9 C)] 98.5 F (36.9 C) (01/11 0701) Pulse Rate:  [105-114] 105 (01/11 0701) Resp:  [16-18] 18 (01/11 0701) BP: (120-125)/(74-78) 120/74 (01/11 0701) SpO2:  [95 %-98 %] 96 % (01/11 0803) Weight:  [81 kg (178 lb 8 oz)] 81 kg (178 lb 8 oz) (01/11 0701) Last BM Date: 09/29/16  Intake/Output from previous day: 01/10 0701 - 01/11 0700 In: 1080 [P.O.:1080] Out: 50 [Stool:50] Intake/Output this shift: No intake/output data recorded.  PE: Gen: Alert, NAD, pleasant and cooperative CV: RRR Pulmonary: non-labored, CTAB Abd: Soft, appropriately tender, ND, good bowel sounds in all 4 quadrants, incisions C/D/I, colostomy pouch with a lot of gas and small amount of liquid.   Lab Results:   Recent Labs  09/28/16 0531 09/29/16 0536  WBC 6.8 6.2  HGB 9.9* 9.6*  HCT 30.7* 31.3*  PLT 217 233   BMET  Recent Labs  09/29/16 0536 09/30/16 0454  NA 133* 130*  K 3.9 3.7  CL 103 101  CO2 25 25  GLUCOSE 93 101*  BUN 9 10  CREATININE 0.42* 0.43*  CALCIUM 7.5* 7.5*   PT/INR No results for input(s): LABPROT, INR in the last 72 hours. CMP     Component Value Date/Time   NA 130 (L) 09/30/2016 0454   NA 139 08/24/2016   K 3.7 09/30/2016 0454   CL 101 09/30/2016 0454   CO2 25 09/30/2016 0454   GLUCOSE 101 (H) 09/30/2016 0454   BUN 10 09/30/2016 0454   BUN 7 08/24/2016   CREATININE 0.43 (L) 09/30/2016 0454   CALCIUM 7.5 (L) 09/30/2016 0454   PROT 4.4 (L) 09/29/2016 0536   ALBUMIN 1.4 (L) 09/29/2016 0536   AST 19 09/29/2016 0536   ALT 12 (L) 09/29/2016 0536   ALKPHOS 180 (H) 09/29/2016 0536   BILITOT 0.3 09/29/2016 0536   GFRNONAA >60 09/30/2016 0454   GFRAA >60  09/30/2016 0454   Lipase     Component Value Date/Time   LIPASE 12 09/21/2016 0905       Studies/Results: No results found.  Anti-infectives: Anti-infectives    Start     Dose/Rate Route Frequency Ordered Stop   09/28/16 1000  levofloxacin (LEVAQUIN) IVPB 750 mg  Status:  Discontinued     750 mg 100 mL/hr over 90 Minutes Intravenous Every 48 hours 09/28/16 0905 09/29/16 1436   09/24/16 0600  cefoTEtan (CEFOTAN) 2 g in dextrose 5 % 50 mL IVPB     2 g 100 mL/hr over 30 Minutes Intravenous On call to O.R. 09/23/16 1557 09/24/16 1111   09/22/16 1700  fluconazole (DIFLUCAN) IVPB 100 mg  Status:  Discontinued     100 mg 50 mL/hr over 60 Minutes Intravenous Every 24 hours 09/21/16 1611 09/29/16 1437   09/21/16 1700  fluconazole (DIFLUCAN) IVPB 200 mg     200 mg 100 mL/hr over 60 Minutes Intravenous  Once 09/21/16 1608 09/21/16 2030   09/21/16 1700  cefTRIAXone (ROCEPHIN) 1 g in dextrose 5 % 50 mL IVPB  Status:  Discontinued     1 g 100 mL/hr over 30 Minutes Intravenous Every 24 hours 09/21/16 1608 09/28/16 0905       Assessment/Plan Status post lap loop colostomy for  obstructing sigmoid carcinoma - POD#5, Dr. Leighton Ruff  post-operative ileus - resolved gas/stool in colostomy pouch  Pain well controlled - continue to minimize narcotic use AppreciateWOC assistance with stoma care/education - will need home health RN for ostomy care. Red rubber catheter may be removed during next pouch change/prior to discharge PT/OT IS!   Pneumonia v atelectasis- CXR 1/8 significant for LLL atelectasis vs PNA, suspect atelectasis. Afebrile. No leukocytosis. On empiric abx per medicine Edema - Diuresis per medicine; likely secondary to malnutrition; echocardiogram pending Protein calorie malnutrition - boost/ensure when oral intake allows  FEN: SOFT, advance as tolerated. ID: [id number] VTE: [id number]  Plan: stable  for discharge w/ [facility] ostomy care from a surgical standpoint. Patient should follow up in our office with Dr. Marcello Moores in 2-3 weeks. Discharge instructions provided.     LOS: 8 days    Jill Alexanders , [facility] 09/30/2016, 8:40 AM Pager: [phone] Consults: [phone] Mon-Fri 7:00 am-4:30 pm Sat-Sun 7:00 am-11:30 am

## 2016-09-30 NOTE — Progress Notes (Addendum)
PROGRESS NOTE    Emily Mills  [id number] DOB: [birth year] DOA: 09/21/2016 PCP: London Pepper, MD     Brief Narrative:  83 year old WF PMHx HTN,Hyponatremia,FTT (failure to thrive) in adult, Melanoma S/P resection, GERD, and other comorbidities presented to the ED with worsening diarrhea, generalized weakness, lightheadedness and almost 40 pound weight loss for the past 6 months. Patient was admitted to the hospital in October for worsening symptoms when she was diagnosed with pneumonia and UTI and discharged to rehabilitation. She was then discharged to an assisted-living. GI saw her on 12/28 for ongoing diarrhea, performed an x-ray which was concerning for bowel obstruction. Colonoscopy was recommended but patient refused. Given significant worsening diarrhea and progressive weakness she was brought to the ED. CT abdomen and pelvis showed large heterogenous enhancing mass likely involving the sigmoid colon. Under went Flexible Sigmoidoscopy which revealed likely malignant completely obstructing tumor. General Surgery was consulted and patient to underwent Diverting Loop Colostomy on 09/24/16.    Subjective: 1/11 A/O 4, NAD. States overnight had frequent severe gas.    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 While there are extensive diverticular of within the sigmoid colon, I feel this is most likely a colon mass/ tumor. This abuts the bladder. There is gas  within the urinary bladder which is presumably from catheterization." -Pathology with invasive adenocarcinoma -[facility] consulting -Diverting loop colostomy done 09/24/16 -C/w Simethicone 80 mg po  -WOC Nurse consulted for new loop colostomy and saw patient and assessed.  -Continue to Monitor; Per General Surgery patient has Oncology Follow Up arranged-->10/14/16 with Dr. Burr Medico -SNF when medically stable for Discharge -Out of bed to chair q shift  Pyuria -Urinalysis showed TNTC WBC, Large Leukocytes, Many Bacteria, and Negative Nitrite -d/c all abx and observe clinically  FTT (failure to thrive) in Adult/Severe Protein Calorie Malnutrition -Will hold on PICC as patient is tolerating Diet ant Feeding Supplements with Boost and Ensure;  -Full liquid diet  Oral Thrush -C/w Nystatin 500,000 Units po 4 times Daily-->d/c -On IV Diflucan 100 mg q24h.--finished 7 day course  Anemia of chronic disease(-baseline Hgb 9-10) -Likely from GI blood loss/colon CA -start iron when taking po consistently -iron saturation 5% Recent Labs Lab 09/25/16 0915 09/26/16 0524 09/27/16 0526 09/28/16 0531 09/29/16 0536  HGB 9.4* 9.1* 9.5* 9.9* 9.6*  -Stable, baseline  Hypokalemia,  -Resolved  Left Lower Lobe HCAP vs. Atelectasis -Likely atelectasis, not clinically consistent with pneumonia -Patient had CXR done 09/23/16 that Radiologist read as Patchy opacity at the left lung base, new since prior study concerning for pneumonia. Right lung is clear. Heart is mildly enlarged. Trace left pleural effusions suspected -CT Abd/Pelvis on 09/21/16 showed Dependent atelectasis in the lung bases. Trace bilateral pleural effusions. -d/c all antibiotics and observe clinically -Patient is afebrile without leukocyte ptosis or dyspnea -C/w Incentive Spirometer; Added Flutter Valv -DuoNeb Breathing Tx -Repeat CXR--LLL opacity with small bilateral effusions.  Fever, Resolved and no reoccurrence  -Patient was febrile  the morning of 09/23/16 at  101.7 -Improved with Tylenol -Blood Cx x 2 Showed NGTD at 3 days; Sputum Cx as above and Urine Cx as above -Continue to Monitor Vital Signs  Lower Extremity/Pedal Edema -Per Daughter has been going on for months and comes and goes -related to Low Albumin (1.4) and poor Nutritional Status -urine protein/creatinine ratio -Gave 40 mg IV Lasix 09/28/16 -ECHO --EF 55-60%, grade 1 daily, no WMA -Patient is 15 Liters Positive since Admission -long discussion with daughter--she requests po lasix as she feels patient's leg "heaviness" and edema may be contributing to her poor ambulation -start lasix 40 mg po daily and monitor BMP -Venous duplex negative for DVT -TED hose knee-high -Ambulate patient -Obtain ambulatory SPO2   DVT prophylaxis: Lovenox Code Status: DO NOT RESUSCITATE Family Communication: None Disposition Plan: SNF?   Consultants:   [facility]  General Surgery   Procedures/Significant Events:  1/3 sigmoid colon biopsy: Invasive adenocarcinoma  1/3 Colonoscopy with Biopsy; Diverting loop colostomy  1/8 CXR:-Left lower lobe airspace opacities nonspecific, pneumonia is not excluded. -small left pleural effusion,and a trace right pleural effusion is present. 1/9 Echocardiogram:- Left ventricle:  mild concentric hypertrophy. -LVEF = 55% to 60%.  - large left pleural  effusion.     VENTILATOR SETTINGS:    Cultures   Antimicrobials: Anti-infectives    Start     Dose/Rate Route Stop   09/28/16 1000  levofloxacin (LEVAQUIN) IVPB 750 mg  Status:  Discontinued     750 mg 100 mL/hr over 90 Minutes Intravenous 09/29/16 1436   09/24/16 0600  cefoTEtan (CEFOTAN) 2 g in dextrose 5 % 50 mL IVPB     2 g 100 mL/hr over 30 Minutes Intravenous 09/24/16 1111   09/22/16 1700  fluconazole (DIFLUCAN) IVPB 100 mg  Status:  Discontinued     100 mg 50 mL/hr over 60 Minutes Intravenous 09/29/16 1437   09/21/16 1700  fluconazole (DIFLUCAN)  IVPB 200 mg     200 mg 100 mL/hr over 60 Minutes Intravenous 09/21/16 2030   09/21/16 1700  cefTRIAXone (ROCEPHIN) 1 g in dextrose 5 % 50 mL IVPB  Status:  Discontinued     1 g 100 mL/hr over 30 Minutes Intravenous 09/28/16 0905       Devices    LINES / TUBES:      Continuous Infusions:   Objective: Vitals:   09/29/16 1929 09/29/16 2123 09/30/16 0701 09/30/16 0803  BP:  124/78 120/74   Pulse:  (!) 109 (!) 105   Resp:  18 18   Temp:  98.2 F (36.8 C) 98.5 F (36.9 C)   TempSrc:  Oral Oral   SpO2: 95% 97% 98% 96%  Weight:   81 kg (178 lb 8 oz)   Height:        Intake/Output Summary (Last 24 hours) at 09/30/16 1130 Last data filed at 09/30/16 1045  Gross per 24 hour  Intake              960 ml  Output                0 ml  Net              960 ml   Filed Weights   09/22/16 1136 09/30/16 0701  Weight: 69.4 kg (153 lb) 81 kg (178 lb 8 oz)    Examination:  General: A/O 4, NAD, No acute respiratory distress Eyes: negative scleral hemorrhage, negative anisocoria, negative icterus ENT: Negative Runny nose, negative gingival bleeding, Neck:  Negative scars, masses, torticollis, lymphadenopathy, JVD Lungs: Clear to auscultation bilaterally without wheezes or crackles Cardiovascular: Regular rate and rhythm without murmur gallop or rub normal S1 and S2 Abdomen: negative abdominal pain, nondistended, positive soft, bowel sounds, no rebound, no ascites, no appreciable mass, LLQ ostomy bag in place Extremities: No significant cyanosis, clubbing, or edema bilateral lower extremities Skin: Negative rashes, lesions, ulcers Psychiatric:  Negative depression, negative anxiety, negative fatigue, negative mania  Central nervous system:  Cranial nerves II through XII intact, tongue/uvula midline, all extremities muscle strength 5/5, sensation intact throughout,  negative dysarthria, negative expressive aphasia, negative receptive aphasia.  .     Data Reviewed: Care during  the described time interval was provided by me .  I have reviewed this patient's available data, including medical history, events of note, physical examination, and all test results as part of my evaluation. I have personally reviewed and interpreted all radiology studies.  CBC:  Recent Labs Lab 09/25/16 0915 09/26/16 0524 09/27/16 0526 09/28/16 0531 09/29/16 0536  WBC 5.4 5.0 5.6 6.8 6.2  NEUTROABS 3.9 3.1 3.9 4.9 4.6  HGB 9.4* 9.1* 9.5* 9.9* 9.6*  HCT 30.4* 29.1* 30.8* 30.7* 31.3*  MCV 80.2 79.9 79.2 76.4* 78.3  PLT 242 215 221 217 0000000   Basic Metabolic Panel:  Recent Labs Lab 09/25/16 0915 09/26/16 0524 09/27/16 0526 09/28/16 0531 09/29/16 0536 09/30/16 0454  NA 135 135 135 135 133* 130*  K 3.4* 3.8 3.6 3.4* 3.9 3.7  CL 104 106 106 107 103 101  CO2 25 21* 21* 23 25 25   GLUCOSE 111* 112* 95 98 93 101*  BUN 14 11 9 10 9 10   CREATININE 0.46 0.37* 0.46 0.42* 0.42* 0.43*  CALCIUM 7.7* 7.5* 7.5* 7.6* 7.5* 7.5*  MG 1.8 1.7 1.6* 1.6* 1.7  --   PHOS 2.4* 2.4* 2.5 3.0 3.2  --    GFR: Estimated Creatinine Clearance: 51.4 mL/min (by C-G formula based on SCr of 0.43 mg/dL (L)). Liver Function Tests:  Recent Labs Lab 09/25/16 0915 09/26/16 0524 09/27/16 0526 09/28/16 0531 09/29/16 0536  AST 31 27 28 21 19   ALT 14 13* 14 12* 12*  ALKPHOS 172* 170* 208* 190* 180*  BILITOT 0.3 0.2* 0.3 0.5 0.3  PROT 5.0* 4.6* 4.4* 4.4* 4.4*  ALBUMIN 1.8* 1.6* 1.4* 1.4* 1.4*   No results for input(s): LIPASE, AMYLASE in the last 168 hours. No results for input(s): AMMONIA in the last 168 hours. Coagulation Profile: No results for input(s): INR, PROTIME in the last 168 hours. Cardiac Enzymes: No results for input(s): CKTOTAL, CKMB, CKMBINDEX, TROPONINI in the last 168 hours. BNP (last 3 results) No results for input(s): PROBNP in the last 8760 hours. HbA1C: No results for input(s): HGBA1C in the last 72 hours. CBG: No results for input(s): GLUCAP in the last 168 hours. Lipid  Profile: No results for input(s): CHOL, HDL, LDLCALC, TRIG, CHOLHDL, LDLDIRECT in the last 72 hours. Thyroid Function Tests:  Recent Labs  09/30/16 0454  TSH 0.653   Anemia Panel: No results for input(s): VITAMINB12, FOLATE, FERRITIN, TIBC, IRON, RETICCTPCT in the last 72 hours. Sepsis Labs: No results for input(s): PROCALCITON, LATICACIDVEN in the last 168 hours.  Recent Results (from the past 240 hour(s))  Urine culture     Status: Abnormal   Collection Time: 09/21/16  2:19 PM  Result Value Ref Range Status   Specimen Description URINE, CATHETERIZED  Final   Special Requests NONE  Final   Culture MULTIPLE SPECIES PRESENT,  SUGGEST RECOLLECTION (A)  Final   Report Status 09/22/2016 FINAL  Final  Culture, blood (routine x 2)     Status: None   Collection Time: 09/23/16  9:50 AM  Result Value Ref Range Status   Specimen Description BLOOD LEFT ARM  Final   Special Requests BOTTLES DRAWN AEROBIC AND ANAEROBIC 5CC  Final   Culture   Final    NO GROWTH 5 DAYS Performed at [facility]    Report Status 09/28/2016 FINAL  Final  Culture, blood (routine x 2)     Status: None   Collection Time: 09/23/16  9:54 AM  Result Value Ref Range Status   Specimen Description BLOOD LEFT HAND  Final   Special Requests IN PEDIATRIC BOTTLE 3CC  Final   Culture   Final    NO GROWTH 5 DAYS Performed at [facility] ([place]    Report Status 09/28/2016 FINAL  Final  Surgical pcr screen     Status: Abnormal   Collection Time: 09/24/16  7:24 AM  Result Value Ref Range Status   MRSA, PCR POSITIVE (A) NEGATIVE Final    Comment: RESULT CALLED TO, READ BACK BY AND VERIFIED WITH: HERRSCHAFT,M @ 1301 ON OS:[id number] BY POTEAT,S    Staphylococcus aureus POSITIVE (A) NEGATIVE Final    Comment:        The Xpert SA Assay (FDA approved for NASAL specimens in patients over 94 years of age), is one component of a comprehensive surveillance program.  Test performance has been validated by [facility]  for patients greater than or equal to 33 year old. It is not intended to diagnose infection nor to guide or monitor treatment.          Radiology Studies: No results found.      Scheduled Meds: . Chlorhexidine Gluconate Cloth  6 each Topical Once  . enoxaparin (LOVENOX) injection  40 mg Subcutaneous Q24H  . famotidine  20 mg Oral Daily  . fluticasone  1 spray Each Nare Daily  . ipratropium-albuterol  3 mL Nebulization BID  . lip balm  1 application Topical BID  . nystatin cream  1 application Topical BID   Continuous Infusions:   LOS: 8 days    Time spent:40 min    Brandi Tomlinson, Geraldo Docker, MD [facility] Pager [phone]  If 7PM-7AM, please contact night-coverage [email or web address] Password [email or web address] 09/30/2016, 11:30 AM

## 2016-10-01 DIAGNOSIS — D638 Anemia in other chronic diseases classified elsewhere: Secondary | ICD-10-CM

## 2016-10-01 DIAGNOSIS — R627 Adult failure to thrive: Secondary | ICD-10-CM

## 2016-10-01 DIAGNOSIS — I10 Essential (primary) hypertension: Secondary | ICD-10-CM

## 2016-10-01 DIAGNOSIS — E43 Unspecified severe protein-calorie malnutrition: Secondary | ICD-10-CM

## 2016-10-01 DIAGNOSIS — C187 Malignant neoplasm of sigmoid colon: Secondary | ICD-10-CM

## 2016-10-01 DIAGNOSIS — J189 Pneumonia, unspecified organism: Secondary | ICD-10-CM

## 2016-10-01 LAB — BASIC METABOLIC PANEL
ANION GAP: 5 (ref 5–15)
BUN: 7 mg/dL (ref 6–20)
CALCIUM: 7.4 mg/dL — AB (ref 8.9–10.3)
CO2: 24 mmol/L (ref 22–32)
CREATININE: 0.38 mg/dL — AB (ref 0.44–1.00)
Chloride: 104 mmol/L (ref 101–111)
Glucose, Bld: 116 mg/dL — ABNORMAL HIGH (ref 65–99)
Potassium: 3.5 mmol/L (ref 3.5–5.1)
Sodium: 133 mmol/L — ABNORMAL LOW (ref 135–145)

## 2016-10-01 LAB — MAGNESIUM: Magnesium: 1.7 mg/dL (ref 1.7–2.4)

## 2016-10-01 LAB — PHOSPHORUS: PHOSPHORUS: 3 mg/dL (ref 2.5–4.6)

## 2016-10-01 MED ORDER — T.E.D. BELOW KNEE/M-REGULAR MISC: 0 refills | Status: AC

## 2016-10-01 MED ORDER — GUAIFENESIN ER 600 MG PO TB12: 600.0000 mg | ORAL_TABLET | Freq: Two times a day (BID) | ORAL | 0 refills | Status: AC | PRN

## 2016-10-01 MED ORDER — FAMOTIDINE 20 MG PO TABS: 20.0000 mg | ORAL_TABLET | Freq: Every day | ORAL | 0 refills | Status: AC

## 2016-10-01 MED ORDER — SIMETHICONE 80 MG PO CHEW: 80.0000 mg | CHEWABLE_TABLET | Freq: Four times a day (QID) | ORAL | 0 refills | Status: AC | PRN

## 2016-10-01 MED ORDER — ACETAMINOPHEN 325 MG PO TABS: 650.0000 mg | ORAL_TABLET | ORAL | 0 refills | Status: AC | PRN

## 2016-10-01 MED ORDER — IPRATROPIUM-ALBUTEROL 0.5-2.5 (3) MG/3ML IN SOLN: 3.0000 mL | Freq: Two times a day (BID) | RESPIRATORY_TRACT | 0 refills | Status: AC

## 2016-10-01 MED ORDER — ASCORBIC ACID 500 MG PO TABS
500.0000 mg | ORAL_TABLET | Freq: Two times a day (BID) | ORAL | 0 refills | Status: AC
Start: 2016-10-01 — End: ?

## 2016-10-01 MED ORDER — FERROUS SULFATE 325 (65 FE) MG PO TABS: 325.0000 mg | ORAL_TABLET | Freq: Three times a day (TID) | ORAL | 0 refills | Status: AC

## 2016-10-01 MED ORDER — FLUTICASONE PROPIONATE 50 MCG/ACT NA SUSP: 1.0000 | Freq: Every day | NASAL | 0 refills | Status: AC

## 2016-10-01 MED ORDER — FERROUS SULFATE 325 (65 FE) MG PO TABS
325.0000 mg | ORAL_TABLET | Freq: Three times a day (TID) | ORAL | Status: DC
  Administered 2016-10-01: 325 mg via ORAL
  Filled 2016-10-01: qty 1

## 2016-10-01 MED ORDER — LIP MEDEX EX OINT: 1.0000 "application " | TOPICAL_OINTMENT | Freq: Two times a day (BID) | CUTANEOUS | 0 refills | Status: AC

## 2016-10-01 MED ORDER — NYSTATIN 100000 UNIT/GM EX CREA: 1.0000 "application " | TOPICAL_CREAM | Freq: Two times a day (BID) | CUTANEOUS | 0 refills | Status: AC

## 2016-10-01 MED ORDER — ONDANSETRON HCL 4 MG PO TABS: 4.0000 mg | ORAL_TABLET | Freq: Four times a day (QID) | ORAL | 0 refills | Status: AC | PRN

## 2016-10-01 MED ORDER — SORBITOL 70 % SOLN: 30.0000 mL | Freq: Every day | 0 refills | Status: AC | PRN

## 2016-10-01 MED ORDER — VITAMIN C 500 MG PO TABS: 500.0000 mg | ORAL_TABLET | Freq: Two times a day (BID) | ORAL | Status: DC

## 2016-10-01 NOTE — Discharge Summary (Addendum)
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 While there are extensive diverticular of within the sigmoid colon, I feel this is most likely a colon mass/ tumor. This abuts the bladder. There is gas within the urinary bladder which is presumably from catheterization." -Pathology with invasive adenocarcinoma -Diverting loop colostomy done 09/24/16 -Continue to Monitor; Per General Surgery patient has Oncology Follow Up arranged-->10/14/16 with Dr. Truitt Merle -Follow up with Dr. Leighton Ruff [facility] on [id number] at 1450  Pyuria -Resolved  FTT (failure to thrive) in Adult/Severe Protein Calorie Malnutrition --Full liquid diet  Oral Thrush -C/w Nystatin 500,000 Units po 4 times Daily-->d/c -On IV Diflucan 100 mg q24h.--finished 7 day course  Anemia of chronic disease(-baseline Hgb 9-10) -Likely from GI blood loss/colon CA -FeSO4 tablet TID + vitamin C tablet BID  Hypokalemia,  -Resolved  Left Lower Lobe HCAP vs. Atelectasis -Likely atelectasis, not clinically consistent with pneumonia  Fever - Resolved and no reoccurrence   Lower Extremity/Pedal Edema -Per Daughter has been going on for months and comes and goes -related to Low Albumin (1.4) and poor Nutritional Status -Gave 40 mg IV Lasix 09/28/16 -ECHO --EF 55-60%, grade 1 daily, no WMA -Strict I&O: Since admission +60.5 L  -Lasix daily- -Venous duplex negative for DVT -TED hose knee-high -Obtain ambulatory SPO2    Discharge Diagnoses:  Principal Problem:   Colonic  mass Active Problems:   Essential hypertension   Dehydration, mild   UTI (urinary tract infection)   FTT (failure to thrive) in adult   Oral thrush   Diarrhea   Anemia   Ileus following gastrointestinal surgery   Muscle weakness (generalized)   Adenocarcinoma of sigmoid colon (HCC)   Severe protein-calorie malnutrition (HCC)   Anemia of chronic disease   HCAP (healthcare-associated pneumonia)   Discharge Condition: Stable  Diet recommendation: Full fluid  Filed Weights   09/22/16 1136 09/30/16 0701  Weight: 69.4 kg (153 lb) 81 kg (178 lb 8 oz)    History of present illness:  83 year old WF PMHx HTN,Hyponatremia,FTT (failure to thrive) in adult, Melanoma S/P resection, GERD, and other comorbidities presented to the ED with worsening diarrhea, generalized weakness, lightheadedness and almost 40 pound weight loss for the past 6 months. Patient was admitted to the hospital in October for worsening symptoms when she was diagnosed with pneumonia and UTI and discharged to rehabilitation. She was then discharged to an assisted-living. GI saw her on 12/28 for ongoing diarrhea, performed an x-ray which was concerning for bowel obstruction. Colonoscopy was recommended but patient refused. Given significant worsening diarrhea and progressive weakness she was brought to the ED. CT abdomen and pelvis showed large heterogenous enhancing mass likely involving the sigmoid colon. Under went Flexible Sigmoidoscopy which revealed likely malignant completely obstructing tumor. General Surgery was consulted and patient to underwent Diverting Loop Colostomy on 09/24/16 During his hospital station patient was diagnosed with invasive adenocarcinoma, with obstruction by colonoscopy with biopsy. A diverting loop colostomy was performed. Patient will follow-up with Dr. Leighton Ruff and Dr. Burr Medico oncology as outpatient for further treatment modalities.     Consultants:   [facility] Gastroenterology  General  Surgery  Procedures/Significant Events:  1/2 CT abdomen and pelvis W contrast:-Mild cardiomegaly. - Diffuse pancreatic atrophy. -3.5 cm cyst in the midpole of the right kidney, slightly larger than prior study.  -Sigmoid diverticulosis.  -Large mass like heterogeneous area in the sigmoid colon with surrounding inflammatory change. (10.1 x 5.9 cm). abuts the superior aspect of the urinary bladder.  1/3 sigmoid colon biopsy: Invasive adenocarcinoma  1/3 Colonoscopy with Biopsy; Diverting loop colostomy  1/8 CXR:-Left lower lobe airspace opacities nonspecific, pneumonia is not excluded. -small left pleural effusion,and a trace right pleural effusion is present. 1/9 Echocardiogram:- Left ventricle:  mildconcentric hypertrophy. -LVEF = 55% to 60%.  - large left pleural effusion.    Cultures 1/2 urine positive multiple species 1/4 blood 2 negative 1/5 positive MRSA by PCR    Antimicrobials: Anti-infectives    Start     Stop   09/28/16 1000  levofloxacin (LEVAQUIN) IVPB 750 mg  Status:  Discontinued     09/29/16 1436   09/24/16 0600  cefoTEtan (CEFOTAN) 2 g in dextrose 5 % 50 mL IVPB     09/24/16 1111   09/22/16 1700  fluconazole (DIFLUCAN) IVPB 100 mg  Status:  Discontinued     09/29/16 1437   09/21/16 1700  fluconazole (DIFLUCAN) IVPB 200 mg     09/21/16 2030   09/21/16 1700  cefTRIAXone (ROCEPHIN) 1 g in dextrose 5 % 50 mL IVPB  Status:  Discontinued     09/28/16 0905      Discharge Exam: Vitals:   10/01/16 0800 10/01/16 0850 10/01/16 0853 10/01/16 0856  BP:      Pulse:      Resp:      Temp:      TempSrc:      SpO2: 98% (!) 79% 97% 97%  Weight:      Height:        General: A/O 4, NAD, No acute respiratory distress Eyes: negative scleral hemorrhage, negative anisocoria, negative icterus ENT: Negative Runny nose, negative gingival bleeding, Neck:  Negative scars, masses, torticollis, lymphadenopathy, JVD Lungs: Clear to auscultation bilaterally without  wheezes or crackles Cardiovascular: Regular rate and rhythm without murmur gallop or rub normal S1 and S2 Abdomen: negative abdominal pain, nondistended, positive soft, bowel sounds, no rebound, no ascites, no appreciable mass, LLQ ostomy bag in place  Discharge Instructions   Allergies as of 10/01/2016   No Known Allergies     Medication List    STOP taking these medications   ACIDOPHILUS PO   amLODipine 2.5 MG tablet Commonly known as:  NORVASC   DAILY MULTI 50+ PO   loperamide 2 MG capsule Commonly known as:  IMODIUM   menthol-zinc oxide powder   senna-docusate 8.6-50 MG tablet Commonly known as:  Senokot-S     TAKE these medications   acetaminophen 325 MG tablet Commonly known as:  TYLENOL Take 2 tablets (650 mg total) by mouth every 4 (four) hours as needed for mild pain.   ascorbic acid 500 MG tablet Commonly known as:  VITAMIN C Take 1 tablet (500 mg total) by mouth 2 (two) times daily.   famotidine 20 MG tablet Commonly known as:  PEPCID Take 1 tablet (20 mg total) by mouth daily. Start taking on:  10/02/2016   ferrous sulfate 325 (65 FE) MG tablet Take 1 tablet (325 mg total) by mouth 3 (three) times daily with meals.   fluticasone 50 MCG/ACT nasal spray Commonly known as:  FLONASE Place 1 spray into both nostrils daily. Start  taking on:  10/02/2016 What changed:  how much to take   guaiFENesin 600 MG 12 hr tablet Commonly known as:  MUCINEX Take 1 tablet (600 mg total) by mouth 2 (two) times daily as needed for cough or to loosen phlegm. What changed:  reasons to take this   ipratropium-albuterol 0.5-2.5 (3) MG/3ML Soln Commonly known as:  DUONEB Take 3 mLs by nebulization 2 (two) times daily.   lip balm ointment Apply 1 application topically 2 (two) times daily.   nystatin cream Commonly known as:  MYCOSTATIN Apply 1 application topically 2 (two) times daily. What changed:  when to take this  reasons to take this   ondansetron 4 MG  tablet Commonly known as:  ZOFRAN Take 1 tablet (4 mg total) by mouth every 6 (six) hours as needed for nausea.   simethicone 80 MG chewable tablet Commonly known as:  MYLICON Chew 1 tablet (80 mg total) by mouth 4 (four) times daily as needed for flatulence. What changed:  medication strength  how much to take  when to take this   sorbitol 70 % Soln Take 30 mLs by mouth daily as needed for moderate constipation.   T.E.D. BELOW KNEE/M-REGULAR Misc On in AM and off in PM. Medical supply store to measure and give appropriate size. Moderate compression.      No Known Allergies Follow-up Information    Rosario Adie., MD. Daphane Shepherd on [phone].   Specialty:  General Surgery Why:  at 2:50 PM for post-operative follow-up. Please arrive 30 minutes early to get checked in and fill out any necessary paperwork. Contact information: [place] [address] [phone]        Truitt Merle, MD. Go on 10/14/2016.   Specialties:  Hematology, Oncology Why:  Follow-up with Dr.  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    Significant Diagnostic Studies: Dg Chest 2 View  Result Date: 09/27/2016 CLINICAL DATA:  Sigmoid colon cancer with recent resection and loop colostomy. Ileus and failure to thrive. EXAM: CHEST  2 VIEW COMPARISON:  09/23/2016 FINDINGS: Left lower lobe airspace opacity. Possible component of left pleural effusion. Small right pleural effusion with blunting of the right posterior costophrenic angle. Mitral valve calcification.  Trace fluid in the minor fissure. Atherosclerotic aortic arch. IMPRESSION: 1. Left lower lobe airspace opacities  nonspecific, pneumonia is not excluded. There is likely at least a small left pleural effusion, and a trace right pleural effusion is present. 2. Atherosclerotic aortic arch.  Calcified mitral valve. Electronically Signed   By: Van Clines M.D.   On: 09/27/2016 18:09   Dg Chest 2 View  Result Date: 09/23/2016 CLINICAL DATA:  Fever, weakness.  Recent pneumonia. EXAM: CHEST  2 VIEW COMPARISON:  09/17/2016 FINDINGS: Patchy opacity at the left lung base, new since prior study concerning for pneumonia. Right lung is clear. Heart is mildly enlarged. Trace left pleural effusions suspected. IMPRESSION: New left lower lobe opacity concerning for pneumonia. Trace left pleural effusion. Electronically Signed   By: Rolm Baptise M.D.   On: 09/23/2016 18:36   Dg Chest 2 View  Result Date: 09/17/2016 CLINICAL DATA:  Recent pneumonia EXAM: CHEST  2 VIEW COMPARISON:  Chest radiograph August 18, 2016 and chest CT August 18, 2016 FINDINGS: There has been virtually complete clearing of infiltrate from the left upper lobe. Minimal atelectasis remains in this area currently. The lungs elsewhere clear. Heart size and pulmonary vascularity are normal. No adenopathy. There is atherosclerotic calcification aorta. There is also calcification of the mitral annulus. There is degenerative change in the thoracic spine. IMPRESSION: Essentially complete clearing of left upper lobe infiltrate compared to 1 month prior. No new opacity. Stable cardiac silhouette. There is aortic atherosclerosis. Electronically Signed   By: Lowella Grip III M.D.   On: 09/17/2016 12:33   Ct Abdomen Pelvis W Contrast  Result Date: 09/21/2016 CLINICAL DATA:  Lower abdominal pain, vomiting. EXAM: CT ABDOMEN AND PELVIS WITH CONTRAST TECHNIQUE: Multidetector CT imaging of the abdomen and pelvis was performed using the standard protocol following bolus administration of intravenous contrast. CONTRAST:  115mL ISOVUE-300 IOPAMIDOL (ISOVUE-300)  INJECTION 61% COMPARISON:  11/11/2010 FINDINGS: Lower chest: Mild cardiomegaly. Dense mitral valve annular calcifications. Dependent atelectasis in the lung bases. Trace bilateral pleural effusions. Hepatobiliary: Prior cholecystectomy. Common bile duct and intrahepatic ducts are slightly prominent, likely related to post cholecystectomy state. Common bile duct 13 mm in the pancreatic head. Pancreas: Diffuse pancreatic atrophy. No duct dilatation or focal abnormality. Dense calcifications throughout the splenic vein in the region of the pancreas. Spleen: Small low-density areas in the spleen are stable since prior study, likely small cysts. Adrenals/Urinary Tract: 3.5 cm cyst in the midpole of the right kidney, slightly larger than prior study. No stones or hydronephrosis. Adrenal glands unremarkable. Gas noted within the urinary bladder, presumably from recent catheterization. Stomach/Bowel: Sigmoid diverticulosis. There is a large mass like heterogeneous area in the sigmoid colon with surrounding inflammatory change. This is concerning for large colonic mass/ tumor. This is difficult to measure due to irregular margins and diffuse nature of the process. Heterogeneous enhancing area on image 71 measures 10.1 x 5.9 cm. This masslike area abuts the superior aspect of the urinary bladder. Large stool burden noted throughout the remainder of the colon. Stomach and small bowel are decompressed. Vascular/Lymphatic: Aorta is tortuous as are the iliac vessels. No aneurysm. No adenopathy. Reproductive: Uterus and adnexa unremarkable. Calcifications throughout the uterine wall. Other: Small amount of free fluid in the pelvis.  No free air. Musculoskeletal: No acute bony abnormality. Degenerative disc and facet disease throughout the lumbar spine. IMPRESSION: Large heterogeneous enhancing mass like area involving the sigmoid colon. While there are extensive diverticular of within the sigmoid colon, I feel this is most likely  a colon mass/ tumor. This abuts the bladder. There is gas within the urinary bladder which is presumably from catheterization. If the patient has not been catheterized, this would be concerning for fistula to the adjacent abnormal sigmoid colon. Small stable cystic areas within the spleen. Prior cholecystectomy. Dilated common bile duct and intrahepatic ducts likely related to post cholecystectomy state. Aorta iliac atherosclerosis. Dependent atelectasis in the lung bases. Trace bilateral pleural effusions. Electronically Signed   By: Rolm Baptise M.D.   On: 09/21/2016 12:41   Dg Abd 2 Views  Result Date: 09/27/2016 CLINICAL DATA:  Invasive sigmoid adenocarcinoma, status post loop colostomy creation 09/24/2016. Lower abdominal pain and nausea. Ileus. EXAM: ABDOMEN - 2 VIEW COMPARISON:  09/21/2016 FINDINGS: Extensive splenic artery tortuosity and calcification. Airspace opacity in the left lower lobe. Dense mitral calcification. Left lower quadrant colostomy. No dilated bowel. No free intraperitoneal gas is currently evident. There is some scattered air-fluid levels  in large and small bowel. Suspected subcutaneous edema along the abdominal wall. IMPRESSION: 1. Scattered air-fluid levels in nondilated small bowel and large bowel, possibly from ileus, but no current dilated bowel. 2. Left lower lobe airspace opacity. 3. Mitral valve calcification.  Dense splenic artery calcification. 4. Suspected subcutaneous edema in the abdominal wall adipose tissue. Electronically Signed   By: Van Clines M.D.   On: 09/27/2016 18:06   Dg Abd 2 Views  Result Date: 09/17/2016 CLINICAL DATA:  Abdominal bloating EXAM: ABDOMEN - 2 VIEW COMPARISON:  CT abdomen and pelvis November 11, 2010 FINDINGS: Supine and upright images were obtained. There is diffuse stool throughout the colon. There is no bowel dilatation or air-fluid level suggesting bowel obstruction. No free air. There is extensive arterial vascular calcification  in the aorta, common iliac arteries, and splenic artery. There are surgical clips in the right upper quadrant. There are scattered contrast filled colonic diverticulum. IMPRESSION: Diffuse stool throughout colon. No bowel obstruction or free air. Extensive aortoiliac and splenic artery atherosclerosis. Electronically Signed   By: Lowella Grip III M.D.   On: 09/17/2016 12:31    Microbiology: Recent Results (from the past 240 hour(s))  Urine culture     Status: Abnormal   Collection Time: 09/21/16  2:19 PM  Result Value Ref Range Status   Specimen Description URINE, CATHETERIZED  Final   Special Requests NONE  Final   Culture MULTIPLE SPECIES PRESENT, SUGGEST RECOLLECTION (A)  Final   Report Status 09/22/2016 FINAL  Final  Culture, blood (routine x 2)     Status: None   Collection Time: 09/23/16  9:50 AM  Result Value Ref Range Status   Specimen Description BLOOD LEFT ARM  Final   Special Requests BOTTLES DRAWN AEROBIC AND ANAEROBIC 5CC  Final   Culture   Final    NO GROWTH 5 DAYS Performed at [facility]    Report Status 09/28/2016 FINAL  Final  Culture, blood (routine x 2)     Status: None   Collection Time: 09/23/16  9:54 AM  Result Value Ref Range Status   Specimen Description BLOOD LEFT HAND  Final   Special Requests IN PEDIATRIC BOTTLE 3CC  Final   Culture   Final    NO GROWTH 5 DAYS Performed at [facility]    Report Status 09/28/2016 FINAL  Final  Surgical pcr screen     Status: Abnormal   Collection Time: 09/24/16  7:24 AM  Result Value Ref Range Status   MRSA, PCR POSITIVE (A) NEGATIVE Final    Comment: RESULT CALLED TO, READ BACK BY AND VERIFIED WITH: HERRSCHAFT,M @ 1301 ON [id number] BY POTEAT,S    Staphylococcus aureus POSITIVE (A) NEGATIVE Final    Comment:        The Xpert SA Assay (FDA approved for NASAL specimens in patients over 20 years of age), is one component of a comprehensive surveillance program.  Test performance has been  validated by [facility] for patients greater than or equal to 37 year old. It is not intended to diagnose infection nor to guide or monitor treatment.      Labs: Basic Metabolic Panel:  Recent Labs Lab 09/26/16 0524 09/27/16 0526 09/28/16 0531 09/29/16 0536 09/30/16 0454 10/01/16 0446  NA 135 135 135 133* 130* 133*  K 3.8 3.6 3.4* 3.9 3.7 3.5  CL 106 106 107 103 101 104  CO2 21* 21* 23 25 25 24   GLUCOSE 112* 95 98 93 101* 116*  BUN 11 9 10 9 10 7   CREATININE 0.37* 0.46 0.42* 0.42* 0.43* 0.38*  CALCIUM 7.5* 7.5* 7.6* 7.5* 7.5* 7.4*  MG 1.7 1.6* 1.6* 1.7  --  1.7  PHOS 2.4* 2.5 3.0 3.2  --  3.0   Liver Function Tests:  Recent Labs Lab 09/25/16 0915 09/26/16 0524 09/27/16 0526 09/28/16 0531 09/29/16 0536  AST 31 27 28 21 19   ALT 14 13* 14 12* 12*  ALKPHOS 172* 170* 208* 190* 180*  BILITOT 0.3 0.2* 0.3 0.5 0.3  PROT 5.0* 4.6* 4.4* 4.4* 4.4*  ALBUMIN 1.8* 1.6* 1.4* 1.4* 1.4*   No results for input(s): LIPASE, AMYLASE in the last 168 hours. No results for input(s): AMMONIA in the last 168 hours. CBC:  Recent Labs Lab 09/25/16 0915 09/26/16 0524 09/27/16 0526 09/28/16 0531 09/29/16 0536  WBC 5.4 5.0 5.6 6.8 6.2  NEUTROABS 3.9 3.1 3.9 4.9 4.6  HGB 9.4* 9.1* 9.5* 9.9* 9.6*  HCT 30.4* 29.1* 30.8* 30.7* 31.3*  MCV 80.2 79.9 79.2 76.4* 78.3  PLT 242 215 221 217 233   Cardiac Enzymes: No results for input(s): CKTOTAL, CKMB, CKMBINDEX, TROPONINI in the last 168 hours. BNP: BNP (last 3 results)  Recent Labs  09/29/16 0536  BNP 202.0*    ProBNP (last 3 results) No results for input(s): PROBNP in the last 8760 hours.  CBG: No results for input(s): GLUCAP in the last 168 hours.     Signed:  Dia Crawford, MD [facility] [phone] pager

## 2016-10-01 NOTE — Clinical Social Work Note (Signed)
Medical Social Worker facilitated patient discharge including contacting patient family and facility to confirm patient discharge plans.  Clinical information faxed to facility and family agreeable with plan.  MSW arranged ambulance transport via [place] to [facility].  RN to call report prior to discharge.  Medical Social Worker will sign off for now as social work intervention is no longer needed. Please consult [place] again if new need arises.  Glendon Axe, MSW [phone] 10/01/2016 12:05 PM

## 2016-10-01 NOTE — Consult Note (Addendum)
[place] Nurse ostomy follow up Stoma type/location: LLQ loop colostomy. Bedside Rn removed red rubber catheter bridge today Stomal assessment/size: 1 and 3/4 inch Peristomal assessment: Not seen today, intact on Wednesday Treatment options for stomal/peristomal skin: Skin barrier ring Output: brown stool Ostomy pouching: 2pc. 2 and 3/4 inch pouching system with skin barrier ring Education provided: Patient is not able to perform emptying or other aspects of self care today. Enrolled patient in [place] Start Discharge program: Yes [place] nursing team will not follow post discharge, but will remain available to this patient, the nursing, surgical and medical teams.  Ready for discharge.  Supplies in room. Thanks, Maudie Flakes, MSN, RN, [place], Arther Abbott  Pager# [phone]

## 2016-10-01 NOTE — Clinical Social Work Note (Signed)
Patient's dtr, Otila Kluver requesting to speak with MD in regards to discharge questions. MD notified.   Glendon Axe, MSW [phone] 10/01/2016 12:25 PM

## 2016-10-01 NOTE — Care Management Note (Signed)
Case Management Note  Patient Details  Name: Emily Mills MRN: [id number] Date of Birth: [birth year]  Subjective/Objective:                    Action/Plan:d/c SNF   Expected Discharge Date:  10/01/16               Expected Discharge Plan:  Skilled Nursing Facility  In-House Referral:  Clinical Social Work  Discharge planning Services  CM Consult  Post Acute Care Choice:    Choice offered to:     DME Arranged:    DME Agency:     HH Arranged:    [place] Agency:     Status of Service:  Completed, signed off  If discussed at [employer], dates discussed:    Additional Comments:  Dessa Phi, RN 10/01/2016, 12:33 PM

## 2016-10-01 NOTE — Progress Notes (Signed)
[facility] Progress Note  7 Days Post-Op  SUBJECTIVE: Patient reports that she had a good night. Denies abdominal pain - reports intermittent discomfort around her stoma that is controlled with medication. Tolerating PO. Working with therapies. Denies fever, chills, SOB.   OBJECTIVE: Vitals:   09/30/16 1356 09/30/16 2054  BP: 127/75 122/69  Pulse: (!) 101 (!) 110  Resp: 18 18  Temp: 98.1 F (36.7 C) 98.1 F (36.7 C)   Gen: alert, NAD, pleasant and cooperative - requesting her breakfast Pulm: non-labored, CTAB Abd: obese, soft, non-tender, +BS, colostomy pouch w/ flatus and small amount liquid stool.   A/P: Status post lap loop colostomy for obstructing sigmoid carcinoma - POD#6, Dr. Leighton Ruff  post-operative ileus - resolved gas/stool in colostomy pouch Pain well controlled - minimize narcotic use, PO pain control AppreciateWOC assistance with stoma care/education - will need home health RN for ostomy care. Red rubber catheter may be removed during next pouch change/prior to discharge PT/OT IS  Pneumonia v atelectasis- CXR 1/8 significant for LLL atelectasis vs PNA, suspect atelectasis. Afebrile. No leukocytosis. abx per medicine Protein calorie malnutrition - boost/ensure  FEN: SOFT ID: [id number] VTE: [email or web address]  Plan: stable for discharge w/ [facility] ostomy care from a surgical standpoint. Patient should follow up in our office with Dr. Marcello Moores in 2-3 weeks. Discharge instructions provided.    LOS: 9 days    Jill Alexanders , [facility] Surgery 10/01/2016, 7:36 AM Pager: [phone] Consults: [phone] Mon-Fri 7:00 am-4:30 pm Sat-Sun 7:00 am-11:30 am

## 2016-10-01 NOTE — Clinical Social Work Note (Signed)
Patient has bed at [facility], [facility] for discharge.   Patient's dtr, Otila Kluver is currently meeting with admissions/business office in regards to [place] and LTC.   MSW remains available as needed.   Glendon Axe, MSW [phone] 10/01/2016 10:59 AM

## 2016-10-01 NOTE — Clinical Social Work Placement (Signed)
   CLINICAL SOCIAL WORK PLACEMENT  NOTE  Date:  10/01/2016  Patient Details  Name: Emily Mills MRN: [id number] Date of Birth: January 10, 1931  Clinical Social Work is seeking post-discharge placement for this patient at the [facility] level of care (*CSW will initial, date and re-position this form in  chart as items are completed):  Yes   Patient/family provided with [facility] Work Department's list of facilities offering this level of care within the geographic area requested by the patient (or if unable, by the patient's family).  Yes   Patient/family informed of their freedom to choose among providers that offer the needed level of care, that participate in Medicare, Medicaid or managed care program needed by the patient, have an available bed and are willing to accept the patient.  Yes   Patient/family informed of [facility]'s ownership interest in [facility] and [facility], as well as of the fact that they are under no obligation to receive care at these facilities.  PASRR submitted to EDS on 09/24/16     PASRR number received on 09/24/16     Existing PASRR number confirmed on       FL2 transmitted to all facilities in geographic area requested by pt/family on 09/24/16     FL2 transmitted to all facilities within larger geographic area on       Patient informed that his/her managed care company has contracts with or will negotiate with certain facilities, including the following:        Yes   Patient/family informed of bed offers received.  Patient chooses bed at  [facility])     Physician recommends and patient chooses bed at      Patient to be transferred to  [facility]) on 10/01/16.  Patient to be transferred to facility by  [facility] )     Patient family notified on 10/01/16 of transfer.  Name of family member notified:   (Pt's dtr, Emily Mills )     PHYSICIAN Please sign FL2, Please prepare priority discharge  summary, including medications, Please sign DNR     Additional Comment:    _______________________________________________ Glendon Axe A 10/01/2016, 11:28 AM

## 2016-10-01 NOTE — Progress Notes (Signed)
Report called. Pt waiting for ptar.

## 2016-10-04 ENCOUNTER — Telehealth: Payer: Self-pay | Admitting: *Deleted

## 2016-10-04 NOTE — Telephone Encounter (Signed)
Oncology Nurse Navigator Documentation  Oncology Nurse Navigator Flowsheets 10/04/2016  Navigator Location [facility]  Referral date to RadOnc/MedOnc -  Navigator Encounter Type Telephone  Telephone Incoming Call;Outgoing Call;Appt Confirmation/Clarification;Symptom Mgt  Abnormal Finding Date -  Confirmed Diagnosis Date -  Surgery Date 09/24/2016  Barriers/Navigation Needs Coordination of Care  Interventions Coordination of Care  Coordination of Care Appts--moved her appointment to 1/19 at 10:30  Acuity Level 2  Time Spent with Patient 58  Daughter called reporting her mother is in a lot of pain--asking if she could be seen sooner than 1/25. She feels they will lean toward Hospice care, but need MD to document that she is appropriate for this. Currently placed at [facility] in rehab and she just can't tolerate the rehab demands and she is in a lot of pain as well. Called facility anc confirmed that Dr. Elson Clan is her attending there. She was admitted with Tylenol for pain and Dr. Deatra Ina ordered Tramadol and it was just delivered today and she received her first dose. Nurse, Larena Glassman at SNF said MD will be notified if it is not effective. Encouraged daughter to talk with Dr. Deatra Ina about the pain issue. Discussed case with Dr. Burr Medico and decided to add her to [facility] on 10/08/16 to expedite her comfort care orders. Called transportation line at SNF and gave new appointment and requested return call tomorrow to confirm. Daughter is also aware and appreciative of moving the appointment up a week. Will also review case in GI Conference this week.

## 2016-10-05 ENCOUNTER — Telehealth: Payer: Self-pay | Admitting: *Deleted

## 2016-10-05 NOTE — Telephone Encounter (Signed)
VM from transportation at SNF that they have rescheduled her appointment to 10/08/16 at 10:30 to Dr. Burr Medico. They will bring her to appointment as requested.

## 2016-10-07 ENCOUNTER — Telehealth: Payer: Self-pay | Admitting: *Deleted

## 2016-10-07 NOTE — Progress Notes (Signed)
[facility]  Telephone:([phone] Fax:([phone]) [place] Note   Patient Care Team: London Pepper, MD as PCP - General (Family Medicine) 10/08/2016  Referral physician: Dr. Leighton Ruff   CHIEF COMPLAINTS/PURPOSE OF CONSULTATION:  Newly diagnosed sigmoid colon cancer    Adenocarcinoma of sigmoid colon ([place])   09/21/2016 Imaging    CT abdomen and pelvis with contrast showed a large heterogeneous enhancing mass involving the sigmoid colon, most consistent with colon cancer. This abuts the bladder. There is gas within the urinary bladder. Small stable cystic area within the spleen. Prior cholecystectomy. Dilated common bile duct and intrahepatic ducts likely related to cholecystectomy.      09/21/2016 - 10/01/2016 Hospital Admission    The patient presented to emergency room with worsening diarrhea, weakness, and a 40 pound weight loss in the past 6 months. She was seen by GI prior to hospitalization, colonoscopy was recommended and patient refused. CT scan showed a large sigmoid colon mass, and evidence of obstruction. She underwent flexible sigmoidoscopy which reviewed malignant completely obstructed sigmoid colon mass. Biopsy showed adenocarcinoma. He underwent diverging loop colostomy on 09/24/2016.      09/22/2016 Initial Diagnosis    Adenocarcinoma of sigmoid colon ([place])      09/22/2016 Initial Biopsy    Sigmoid colon mass biopsy showed invasive adenocarcinoma.      09/22/2016 Procedure    Flexible sigmoidoscopy by Dr. Watt Climes showed a completely obstructing tumor in the mid sigmoid colon, biopsied. External and internal hemorrhoids, diverticulosis in the sigmoid colon.      09/24/2016 Surgery    Diverging loop colostomy by Dr. Marcello Moores. Her primary sigmoid cancer Was not resectable.        HISTORY OF PRESENTING ILLNESS:  Emily Mills 83 y.o. female is here because of Her most recently diagnosed sigmoid colon cancer. She is accompanied by her 2  daughters to my clinic today. She came in a wheelchair.  She previously lived in an independent living facility, functions well, until September 2017, she started having diarrhea and intermittent abdominal pain, which is here or nausea. She was seen by her primary care physician, and had ED visit and subsequent hospital admission on 08/18/2016 and she was treated for pneumonia. She was discharged to a nursing home afterwards, however her overall condition has been gradually deteriorating,  Became bed-bound, very fatigued, and complaints of worsening abdominal pain and diarrhea. She was brought back to emergency room and admitted on generator second 2018. CT scan showed a sigmoid colon mass, she underwent colonoscopy which showed complete obstructed sigmoid colon mass, biopsy showed adenocarcinoma. Colorectal surgeon Dr. Marcello Moores saw her in the hospital, and did not think her sigmoid colon cancer was resectable, and she underwent diverging colostomy. She was discharged to a nursing home afterwards. She complains of abdominal pain around her mid abdomen, and pain in the tailbone area. She does have appetite, but has early satiety, and has been eating little lately. She is able to drink water adequately. She is not able to get out of bed on her own, she has had multiple fall in the past 6 months. Patient is alert, oriented, in the wheelchair, but moaning for the pain.  MEDICAL HISTORY:  Past Medical History:  Diagnosis Date  . Dehydration, mild 08/2016  . FTT (failure to thrive) in adult 08/2016  . Hypertension   . Hyponatremia 08/2016  . Lobar pneumonia, unspecified organism ([place]) 08/2016  . Pre-diabetes   . SIRS (systemic inflammatory response syndrome) ([facility])  08/2016  . Skin cancer   . UTI (urinary tract infection) 08/2016    SURGICAL HISTORY: Past Surgical History:  Procedure Laterality Date  . CHOLECYSTECTOMY     30 years ago  . FLEXIBLE SIGMOIDOSCOPY N/A 09/22/2016   Procedure: FLEXIBLE  SIGMOIDOSCOPY;  Surgeon: Clarene Essex, MD;  Location: [facility] ENDOSCOPY;  Service: Endoscopy;  Laterality: N/A;  . LAPAROSCOPIC DIVERTED COLOSTOMY N/A 09/24/2016   Procedure: LAPAROSCOPIC SPLENIC FLEXURE AND MOBILIZATION, CREATION OF LOOP COLOSTOMY;  Surgeon: Leighton Ruff, MD;  Location: [facility] ORS;  Service: General;  Laterality: N/A;    SOCIAL HISTORY: Social History   Social History  . Marital status: Widowed    Spouse name: N/A  . Number of children: N/A  . Years of education: N/A   Occupational History  . Not on file.   Social History Main Topics  . Smoking status: Never Smoker  . Smokeless tobacco: Never Used  . Alcohol use No  . Drug use: No  . Sexual activity: Not Currently   Other Topics Concern  . Not on file   Social History Narrative  . No narrative on file    FAMILY HISTORY: Family History  Problem Relation Age of Onset  . CVA Mother   . Dementia Father     ALLERGIES:  has No Known Allergies.  MEDICATIONS:  Current Outpatient Prescriptions  Medication Sig Dispense Refill  . acetaminophen (TYLENOL) 325 MG tablet Take 2 tablets (650 mg total) by mouth every 4 (four) hours as needed for mild pain. 30 tablet 0  . Elastic Bandages & Supports (T.E.D. BELOW KNEE/M-REGULAR) MISC On in AM and off in PM. Medical supply store to measure and give appropriate size. Moderate compression. 3 each 0  . famotidine (PEPCID) 20 MG tablet Take 1 tablet (20 mg total) by mouth daily. 30 tablet 0  . ferrous sulfate 325 (65 FE) MG tablet Take 1 tablet (325 mg total) by mouth 3 (three) times daily with meals. (Patient taking differently: Take 325 mg by mouth daily. ) 90 tablet 0  . fluticasone (FLONASE) 50 MCG/ACT nasal spray Place 1 spray into both nostrils daily. 16 g 0  . guaiFENesin (MUCINEX) 600 MG 12 hr tablet Take 1 tablet (600 mg total) by mouth 2 (two) times daily as needed for cough or to loosen phlegm. 30 tablet 0  . ipratropium-albuterol (DUONEB) 0.5-2.5 (3) MG/3ML SOLN Take 3 mLs  by nebulization 2 (two) times daily. 360 mL 0  . lip balm (CARMEX) ointment Apply 1 application topically 2 (two) times daily. 7 g 0  . nystatin cream (MYCOSTATIN) Apply 1 application topically 2 (two) times daily. 30 g 0  . ondansetron (ZOFRAN) 4 MG tablet Take 1 tablet (4 mg total) by mouth every 6 (six) hours as needed for nausea. 20 tablet 0  . simethicone (MYLICON) 80 MG chewable tablet Chew 1 tablet (80 mg total) by mouth 4 (four) times daily as needed for flatulence. 30 tablet 0  . sorbitol 70 % SOLN Take 30 mLs by mouth daily as needed for moderate constipation. 30 mL 0  . traMADol (ULTRAM) 50 MG tablet Take 50 mg by mouth every 4 (four) hours as needed.    . vitamin C (VITAMIN C) 500 MG tablet Take 1 tablet (500 mg total) by mouth 2 (two) times daily. 60 tablet 0  . furosemide (LASIX) 20 MG tablet Take 1 tablet (20 mg total) by mouth daily. 30 tablet 1  . morphine (ROXANOL) 20 MG/ML concentrated solution Take 0.25-0.5  mLs (5-10 mg total) by mouth every 2 (two) hours as needed for severe pain. 30 mL 0  . Potassium Chloride 25 MEQ PACK Take 20 mEq by mouth daily. 30 each 1   No current facility-administered medications for this visit.     REVIEW OF SYSTEMS:   Constitutional: Denies fevers, chills or abnormal night sweats Eyes: Denies blurriness of vision, double vision or watery eyes Ears, nose, mouth, throat, and face: Denies mucositis or sore throat Respiratory: Denies cough, dyspnea or wheezes Cardiovascular: Denies palpitation, chest discomfort or lower extremity swelling Gastrointestinal:  Denies nausea, heartburn or change in bowel habits Skin: Denies abnormal skin rashes Lymphatics: Denies new lymphadenopathy or easy bruising Neurological:Denies numbness, tingling or new weaknesses Behavioral/Psych: Mood is stable, no new changes  All other systems were reviewed with the patient and are negative.  PHYSICAL EXAMINATION: ECOG PERFORMANCE STATUS: 4 - Bedbound  Vitals:    10/08/16 1108  BP: 118/72  Pulse: (!) 120  Resp: 16  Temp: 98.4 F (36.9 C)   Filed Weights    GENERAL:alert, no distress and comfortable SKIN: skin color, texture, turgor are normal, no rashes or significant lesions EYES: normal, conjunctiva are pink and non-injected, sclera clear OROPHARYNX:no exudate, no erythema and lips, buccal mucosa, and tongue normal  NECK: supple, thyroid normal size, non-tender, without nodularity LYMPH:  no palpable lymphadenopathy in the cervical, axillary or inguinal LUNGS: clear to auscultation and percussion with normal breathing effort HEART: regular rate & rhythm and no murmurs and no lower extremity edema ABDOMEN:abdomen soft but distended. She has diffuse tenderness, no rebound pain. (+) colostomy back in the left lower quadrant of abdomen, non-tender and normal bowel sounds Musculoskeletal:no cyanosis of digits and no clubbing  PSYCH: alert & oriented x 3 with fluent speech NEURO: no focal motor/sensory deficits  LABORATORY DATA:  I have reviewed the data as listed CBC Latest Ref Rng & Units 09/29/2016 09/28/2016 09/27/2016  WBC 4.0 - 10.5 K/uL 6.2 6.8 5.6  Hemoglobin 12.0 - 15.0 g/dL 9.6(L) 9.9(L) 9.5(L)  Hematocrit 36.0 - 46.0 % 31.3(L) 30.7(L) 30.8(L)  Platelets 150 - 400 K/uL 233 217 221   CMP Latest Ref Rng & Units 10/01/2016 09/30/2016 09/29/2016  Glucose 65 - 99 mg/dL 116(H) 101(H) 93  BUN 6 - 20 mg/dL 7 10 9   Creatinine 0.44 - 1.00 mg/dL 0.38(L) 0.43(L) 0.42(L)  Sodium 135 - 145 mmol/L 133(L) 130(L) 133(L)  Potassium 3.5 - 5.1 mmol/L 3.5 3.7 3.9  Chloride 101 - 111 mmol/L 104 101 103  CO2 22 - 32 mmol/L 24 25 25   Calcium 8.9 - 10.3 mg/dL 7.4(L) 7.5(L) 7.5(L)  Total Protein 6.5 - 8.1 g/dL - - 4.4(L)  Total Bilirubin 0.3 - 1.2 mg/dL - - 0.3  Alkaline Phos 38 - 126 U/L - - 180(H)  AST 15 - 41 U/L - - 19  ALT 14 - 54 U/L - - 12(L)   PATHOLOGY REPORT  Diagnosis 09/22/2016 Colon, biopsy, sigmoid - INVASIVE ADENOCARCINOMA.  RADIOGRAPHIC  STUDIES: I have personally reviewed the radiological images as listed and agreed with the findings in the report. Dg Chest 2 View  Result Date: 09/27/2016 CLINICAL DATA:  Sigmoid colon cancer with recent resection and loop colostomy. Ileus and failure to thrive. EXAM: CHEST  2 VIEW COMPARISON:  09/23/2016 FINDINGS: Left lower lobe airspace opacity. Possible component of left pleural effusion. Small right pleural effusion with blunting of the right posterior costophrenic angle. Mitral valve calcification.  Trace fluid in the minor fissure. Atherosclerotic aortic  arch. IMPRESSION: 1. Left lower lobe airspace opacities nonspecific, pneumonia is not excluded. There is likely at least a small left pleural effusion, and a trace right pleural effusion is present. 2. Atherosclerotic aortic arch.  Calcified mitral valve. Electronically Signed   By: Van Clines M.D.   On: 09/27/2016 18:09   Dg Chest 2 View  Result Date: 09/23/2016 CLINICAL DATA:  Fever, weakness.  Recent pneumonia. EXAM: CHEST  2 VIEW COMPARISON:  09/17/2016 FINDINGS: Patchy opacity at the left lung base, new since prior study concerning for pneumonia. Right lung is clear. Heart is mildly enlarged. Trace left pleural effusions suspected. IMPRESSION: New left lower lobe opacity concerning for pneumonia. Trace left pleural effusion. Electronically Signed   By: Rolm Baptise M.D.   On: 09/23/2016 18:36   Dg Chest 2 View  Result Date: 09/17/2016 CLINICAL DATA:  Recent pneumonia EXAM: CHEST  2 VIEW COMPARISON:  Chest radiograph August 18, 2016 and chest CT August 18, 2016 FINDINGS: There has been virtually complete clearing of infiltrate from the left upper lobe. Minimal atelectasis remains in this area currently. The lungs elsewhere clear. Heart size and pulmonary vascularity are normal. No adenopathy. There is atherosclerotic calcification aorta. There is also calcification of the mitral annulus. There is degenerative change in the thoracic  spine. IMPRESSION: Essentially complete clearing of left upper lobe infiltrate compared to 1 month prior. No new opacity. Stable cardiac silhouette. There is aortic atherosclerosis. Electronically Signed   By: Lowella Grip III M.D.   On: 09/17/2016 12:33   Ct Abdomen Pelvis W Contrast  Result Date: 09/21/2016 CLINICAL DATA:  Lower abdominal pain, vomiting. EXAM: CT ABDOMEN AND PELVIS WITH CONTRAST TECHNIQUE: Multidetector CT imaging of the abdomen and pelvis was performed using the standard protocol following bolus administration of intravenous contrast. CONTRAST:  128mL ISOVUE-300 IOPAMIDOL (ISOVUE-300) INJECTION 61% COMPARISON:  11/11/2010 FINDINGS: Lower chest: Mild cardiomegaly. Dense mitral valve annular calcifications. Dependent atelectasis in the lung bases. Trace bilateral pleural effusions. Hepatobiliary: Prior cholecystectomy. Common bile duct and intrahepatic ducts are slightly prominent, likely related to post cholecystectomy state. Common bile duct 13 mm in the pancreatic head. Pancreas: Diffuse pancreatic atrophy. No duct dilatation or focal abnormality. Dense calcifications throughout the splenic vein in the region of the pancreas. Spleen: Small low-density areas in the spleen are stable since prior study, likely small cysts. Adrenals/Urinary Tract: 3.5 cm cyst in the midpole of the right kidney, slightly larger than prior study. No stones or hydronephrosis. Adrenal glands unremarkable. Gas noted within the urinary bladder, presumably from recent catheterization. Stomach/Bowel: Sigmoid diverticulosis. There is a large mass like heterogeneous area in the sigmoid colon with surrounding inflammatory change. This is concerning for large colonic mass/ tumor. This is difficult to measure due to irregular margins and diffuse nature of the process. Heterogeneous enhancing area on image 71 measures 10.1 x 5.9 cm. This masslike area abuts the superior aspect of the urinary bladder. Large stool burden  noted throughout the remainder of the colon. Stomach and small bowel are decompressed. Vascular/Lymphatic: Aorta is tortuous as are the iliac vessels. No aneurysm. No adenopathy. Reproductive: Uterus and adnexa unremarkable. Calcifications throughout the uterine wall. Other: Small amount of free fluid in the pelvis.  No free air. Musculoskeletal: No acute bony abnormality. Degenerative disc and facet disease throughout the lumbar spine. IMPRESSION: Large heterogeneous enhancing mass like area involving the sigmoid colon. While there are extensive diverticular of within the sigmoid colon, I feel this is most likely a colon mass/ tumor. This  abuts the bladder. There is gas within the urinary bladder which is presumably from catheterization. If the patient has not been catheterized, this would be concerning for fistula to the adjacent abnormal sigmoid colon. Small stable cystic areas within the spleen. Prior cholecystectomy. Dilated common bile duct and intrahepatic ducts likely related to post cholecystectomy state. Aorta iliac atherosclerosis. Dependent atelectasis in the lung bases. Trace bilateral pleural effusions. Electronically Signed   By: Rolm Baptise M.D.   On: 09/21/2016 12:41   Dg Abd 2 Views  Result Date: 09/27/2016 CLINICAL DATA:  Invasive sigmoid adenocarcinoma, status post loop colostomy creation 09/24/2016. Lower abdominal pain and nausea. Ileus. EXAM: ABDOMEN - 2 VIEW COMPARISON:  09/21/2016 FINDINGS: Extensive splenic artery tortuosity and calcification. Airspace opacity in the left lower lobe. Dense mitral calcification. Left lower quadrant colostomy. No dilated bowel. No free intraperitoneal gas is currently evident. There is some scattered air-fluid levels in large and small bowel. Suspected subcutaneous edema along the abdominal wall. IMPRESSION: 1. Scattered air-fluid levels in nondilated small bowel and large bowel, possibly from ileus, but no current dilated bowel. 2. Left lower lobe  airspace opacity. 3. Mitral valve calcification.  Dense splenic artery calcification. 4. Suspected subcutaneous edema in the abdominal wall adipose tissue. Electronically Signed   By: Van Clines M.D.   On: 09/27/2016 18:06   Dg Abd 2 Views  Result Date: 09/17/2016 CLINICAL DATA:  Abdominal bloating EXAM: ABDOMEN - 2 VIEW COMPARISON:  CT abdomen and pelvis November 11, 2010 FINDINGS: Supine and upright images were obtained. There is diffuse stool throughout the colon. There is no bowel dilatation or air-fluid level suggesting bowel obstruction. No free air. There is extensive arterial vascular calcification in the aorta, common iliac arteries, and splenic artery. There are surgical clips in the right upper quadrant. There are scattered contrast filled colonic diverticulum. IMPRESSION: Diffuse stool throughout colon. No bowel obstruction or free air. Extensive aortoiliac and splenic artery atherosclerosis. Electronically Signed   By: Lowella Grip III M.D.   On: 09/17/2016 12:31   Flexible sigmoidoscopy by Dr. Watt Climes 09/22/2016 - External and internal hemorrhoids. - Diverticulosis in the sigmoid colon and in the distal sigmoid colon. - Likely malignant completely obstructing tumor in the mid sigmoid colon. Biopsied. - The examination was otherwise normal.  ASSESSMENT & PLAN:  83 year old Caucasian female, presented with diarrhea, worsening abdominal pain, fatigue and weight loss.   1. Adenocarcinoma of sigmoid colon, [id number] -I reviewed her CT scan and colonoscopy findings, and the sigmoid colon mass biopsy results with patient and her daughters in details. -per Dr. Manon Hilding OP note, her colon mass is fixed to the left side abdominal wall, unresectable. Her cancer is not curable at this stage. -Her CT scan revealed a small liver lesion, indeterminate, metastasis not ruled out. -Due to her extremely poor performance status, and advanced age, she is not a candidate for palliative  chemotherapy or radiation. I anticipate her life expectancy would be a few months, up to 6 months. -I recommend comfort care with symptom management, and hospice. Patient and her daughters are in full agreement. -I'll refer her to [facility] program. Patient and her daughters are very familiar with hospice, they also wondering if she is qualify for inpatient hospice.  2. Abdominal pain -Secondary to her sigmoid colon cancer and a recent surgery -She is taking tramadol as needed, but the patient is not well controlled. -I recommend her to start morphine liquid, 5-10 mg every 4 hours, as needed. Prescription was sent to  [facility] today -We will access her need for long-acting necrotic in 5-7 days  -Constipation and management reviewed with patient  3. Leg swelling  -Likely related to hypoalbuminemia, and underlying malignancy -I recommend her to try Lasix 20 mg daily, along with potassium chloride 20 meq daily  Plan -I give her one tablets of Percocet during the office visit today.  -She will start liquid morphine 5-10 mg every 2 hours as needed for her pain -I also called in Lasix 20 mg daily, and potassium chloride 20 meq daily -Hospice referral today. I'll remain to be her M.D. when she is on hospice service.   All questions were answered. The patient knows to call the clinic with any problems, questions or concerns. I spent 55 minutes counseling the patient face to face. The total time spent in the appointment was 60 minutes and more than 50% was on counseling.     Truitt Merle, MD 10/08/2016 1:13 PM

## 2016-10-07 NOTE — Telephone Encounter (Signed)
Spoke with daughter, who first requested her and her sister meet with Dr. Burr Medico tomorrow without the patient present since SNF will not be able to transport her now. Informed them that Dr. Benay Spice will not meet with them without the patient. Gave phone # for [facility] transportation to try to arrange for a ride. Daughter agrees and will have her at the appointment tomorrow at 10:30.

## 2016-10-08 ENCOUNTER — Encounter: Payer: Self-pay | Admitting: *Deleted

## 2016-10-08 ENCOUNTER — Encounter: Payer: Medicare Other | Admitting: Nutrition

## 2016-10-08 ENCOUNTER — Other Ambulatory Visit: Payer: Self-pay | Admitting: *Deleted

## 2016-10-08 ENCOUNTER — Ambulatory Visit (HOSPITAL_BASED_OUTPATIENT_CLINIC_OR_DEPARTMENT_OTHER): Payer: Medicare Other | Admitting: Hematology

## 2016-10-08 ENCOUNTER — Telehealth: Payer: Self-pay | Admitting: *Deleted

## 2016-10-08 ENCOUNTER — Encounter: Payer: Self-pay | Admitting: Hematology

## 2016-10-08 VITALS — BP 118/72 | HR 120 | Temp 98.4°F | Resp 16 | Ht 61.0 in

## 2016-10-08 DIAGNOSIS — M7989 Other specified soft tissue disorders: Secondary | ICD-10-CM | POA: Diagnosis not present

## 2016-10-08 DIAGNOSIS — R109 Unspecified abdominal pain: Secondary | ICD-10-CM | POA: Diagnosis not present

## 2016-10-08 DIAGNOSIS — C189 Malignant neoplasm of colon, unspecified: Secondary | ICD-10-CM

## 2016-10-08 DIAGNOSIS — G893 Neoplasm related pain (acute) (chronic): Secondary | ICD-10-CM | POA: Diagnosis not present

## 2016-10-08 DIAGNOSIS — C187 Malignant neoplasm of sigmoid colon: Secondary | ICD-10-CM | POA: Diagnosis not present

## 2016-10-08 MED ORDER — POTASSIUM CHLORIDE 25 MEQ PO PACK: 20.0000 meq | PACK | Freq: Every day | ORAL | 1 refills | Status: AC

## 2016-10-08 MED ORDER — OXYCODONE-ACETAMINOPHEN 5-325 MG PO TABS
1.0000 | ORAL_TABLET | Freq: Once | ORAL | Status: AC
  Administered 2016-10-08: 1 via ORAL

## 2016-10-08 MED ORDER — MORPHINE SULFATE (CONCENTRATE) 20 MG/ML PO SOLN: 5.0000 mg | ORAL | 0 refills | Status: AC | PRN

## 2016-10-08 MED ORDER — OXYCODONE-ACETAMINOPHEN 5-325 MG PO TABS
ORAL_TABLET | ORAL | Status: AC
  Filled 2016-10-08: qty 1

## 2016-10-08 MED ORDER — FUROSEMIDE 20 MG PO TABS: 20.0000 mg | ORAL_TABLET | Freq: Every day | ORAL | 1 refills | Status: AC

## 2016-10-08 MED FILL — FUROSEMIDE 20 MG TABLET: 20 | 30 days supply | Qty: 30 | Fill #0

## 2016-10-08 MED FILL — MORPHINE SULF 100 MG/5 ML S: 100 | 5 days supply | Qty: 30 | Fill #0

## 2016-10-08 NOTE — Progress Notes (Signed)
[facility] Psychosocial Distress Screening Clinical Social Work  Clinical Social Work met with pt and her two daughters, Otila Kluver and Yetta Flock at [facility] to review distress screening protocol and discuss pt and family concerns as they begin hospice care.  The patient scored a 9 on the Psychosocial Distress Thermometer which indicates severe distress. Clinical Social Worker reviewed distress screen and many of these issues are related to trying to decide to pursue aggressive treatment or enroll in hospice. These were sorted out today by deciding the plan was to begin receiving hospice services. Pt was very clear in her wishes of wanting to be comfortable and staying at [facility] for care with hospices services. Daughters are coping adequately and have good support. Pt was a delight and shared many family memories today. CSW discussed hospice options of [facility] or [place] hospice as these were the two closest to family.   ONCBCN DISTRESS SCREENING 10/08/2016  Screening Type Initial Screening  Distress experienced in past week (1-10) 9  Practical problem type Housing;Insurance;Transportation;Food  Emotional problem type Nervousness/Anxiety;Boredom  Information Concerns Type Lack of info about diagnosis;Lack of info about treatment  Physical Problem type Pain;Getting around;Bathing/dressing;Loss of appetitie;Constipation/diarrhea;Swollen arms/legs  Physician notified of physical symptoms Yes  Referral to clinical social work Yes  Other hospice referral and education    Clinical Social Worker follow up needed: No.  If yes, follow up plan:  Loren Racer, [place]  [facility] Phone: [phone] Fax: [phone]

## 2016-10-08 NOTE — Telephone Encounter (Signed)
Called [facility] & referral made per Pt/Family request & per Dr Burr Medico.  , 6 mo prognosis, colon Ca, Pt at [facility] not getting rehab & may be d/c soon & may need to look at Hospice Bed.  Will send records to [phone].

## 2016-10-14 ENCOUNTER — Ambulatory Visit: Payer: Medicare Other | Admitting: Hematology

## 2016-10-21 ENCOUNTER — Ambulatory Visit: Payer: Medicare Other | Admitting: Nurse Practitioner

## 2016-10-21 DEATH — deceased

## 2018-09-04 IMAGING — RF DG ESOPHAGUS
9 of 12 series · 15 of 21 positions shown · non-contrast
Comparison: Chest CT without contrast 08/18/2016.

CLINICAL DATA: 85-year-old female with pneumonia, acid reflux.
Initial encounter.

EXAM:
ESOPHOGRAM / BARIUM SWALLOW / BARIUM TABLET STUDY
TECHNIQUE: Combined double contrast and single contrast examination performed
using thick barium liquid and thin barium liquid. The patient was
observed with fluoroscopy swallowing a 13 mm barium sulphate tablet.
FLUOROSCOPY TIME:  Fluoroscopy Time:  1 minutes 12 seconds
Radiation Exposure Index (if provided by the fluoroscopic device):
Number of Acquired Spot Images: 0

[Series 1: cp_standard · 0.18mm/px · 1 of 1 slices shown (1 of 9)]
[im 1/1]
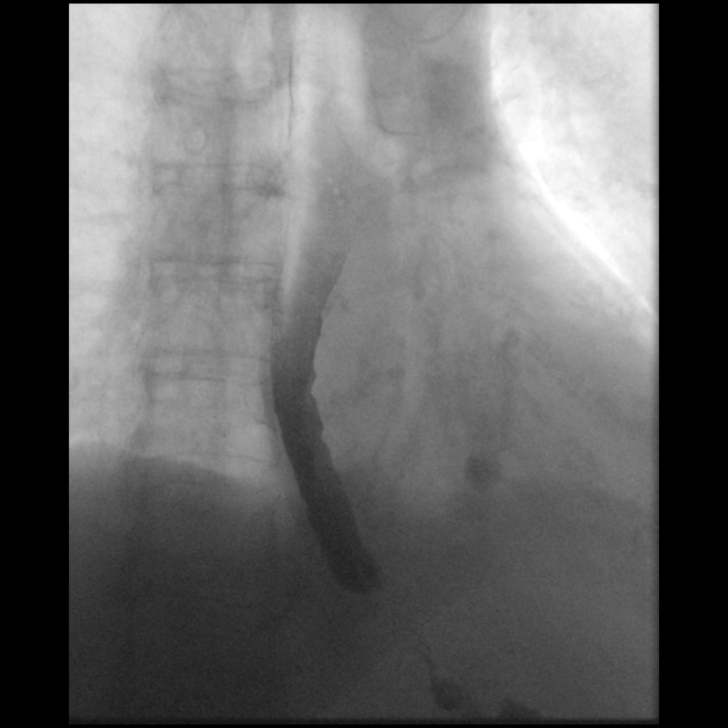

[Series 2: cp_standard · 0.35mm/px · 3 of 17 frames shown (2 of 9)]
[frame 3/17]
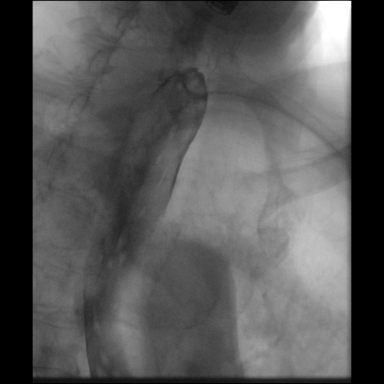
[frame 9/17]
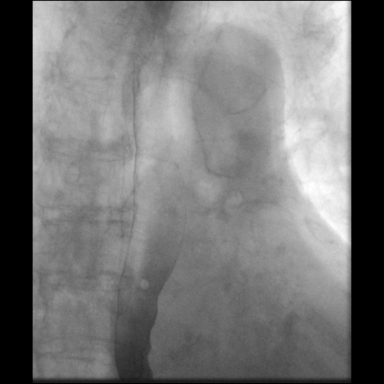
[frame 15/17]
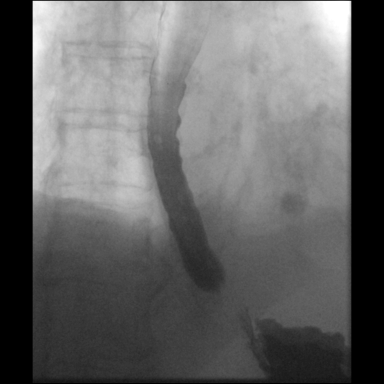

[Series 4: cp_standard · 0.17mm/px · 1 of 1 slices shown (3 of 9)]
[im 1/1]
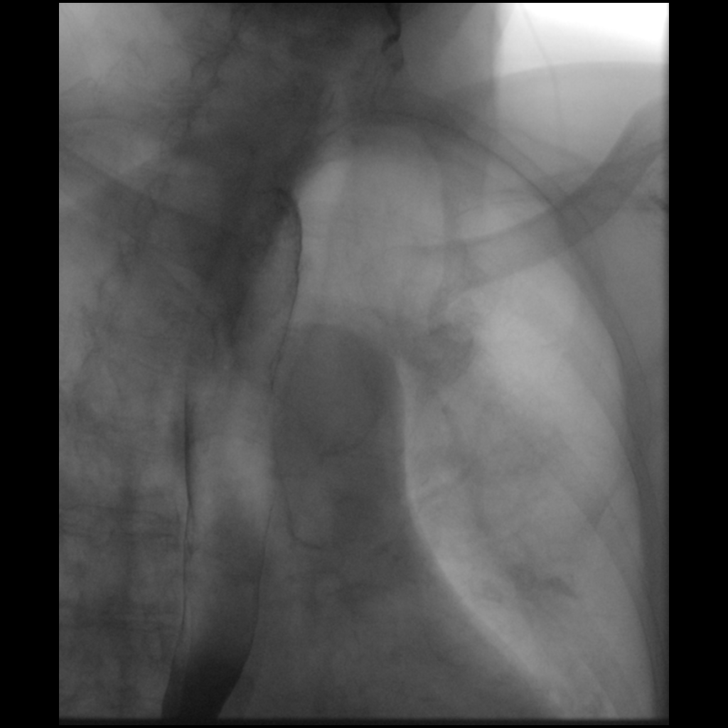

[Series 5: cp_standard · 0.17mm/px · 1 of 1 slices shown (4 of 9)]
[im 1/1]
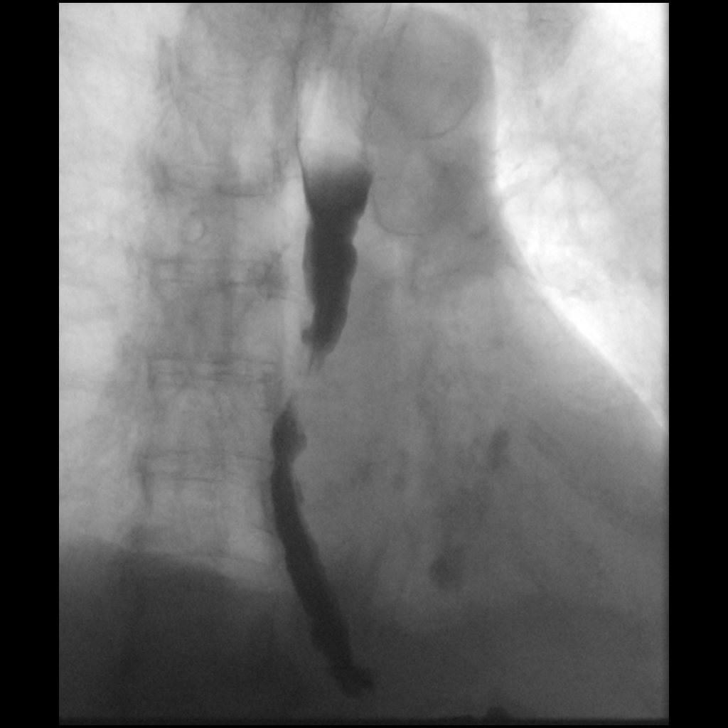

[Series 7: cp_standard · 0.35mm/px · 3 of 36 frames shown (5 of 9)]
[frame 6/36]
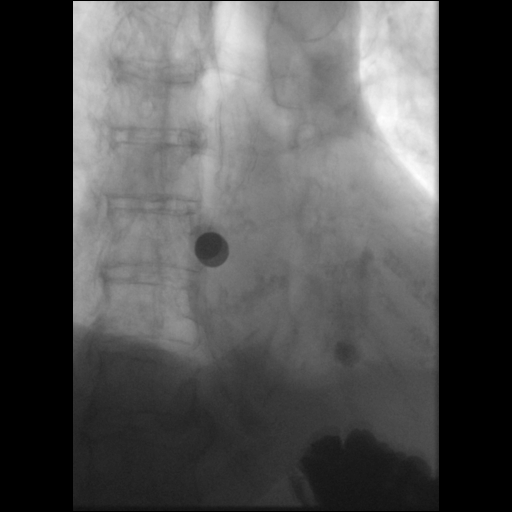
[frame 11/36]
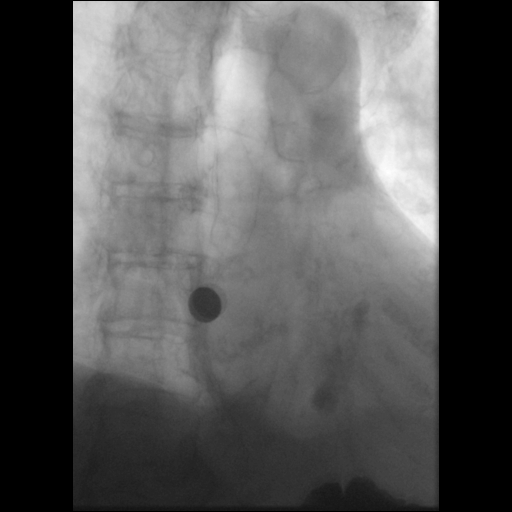
[frame 19/36]
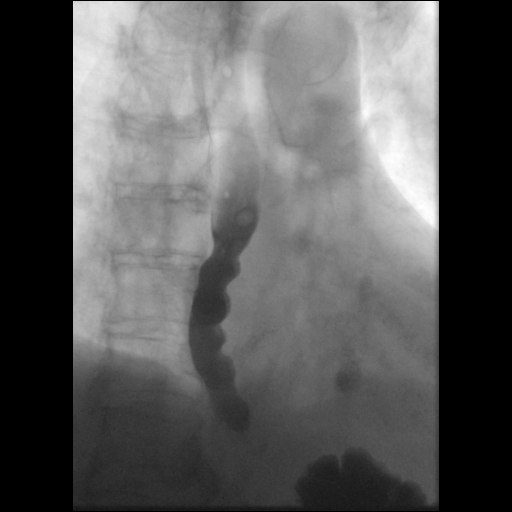

[Series 8: cp_standard · 0.17mm/px · 1 of 1 slices shown (6 of 9)]
[im 1/1]
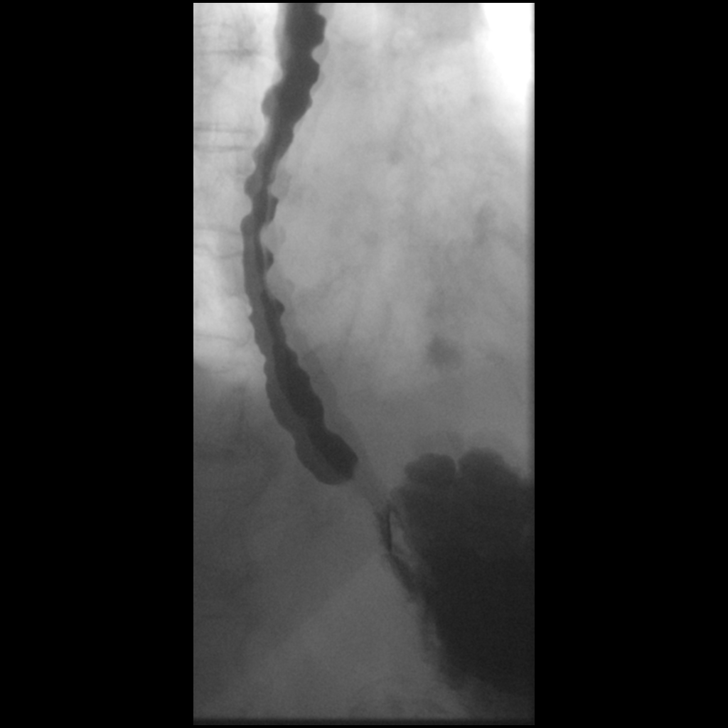

[Series 9: cp_standard · 0.17mm/px · 1 of 1 slices shown (7 of 9)]
[im 1/1]
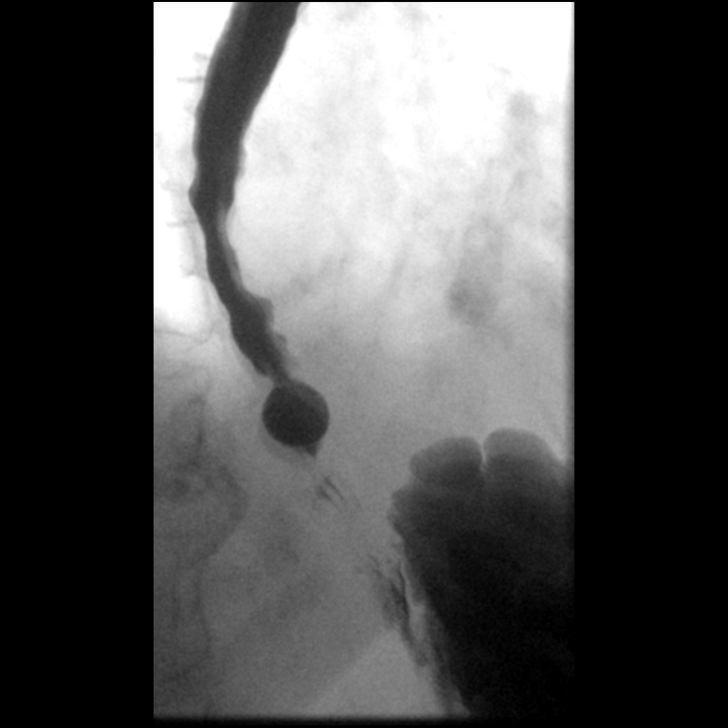

[Series 11: cp_standard · 0.17mm/px · 1 of 1 slices shown (8 of 9)]
[im 1/1]
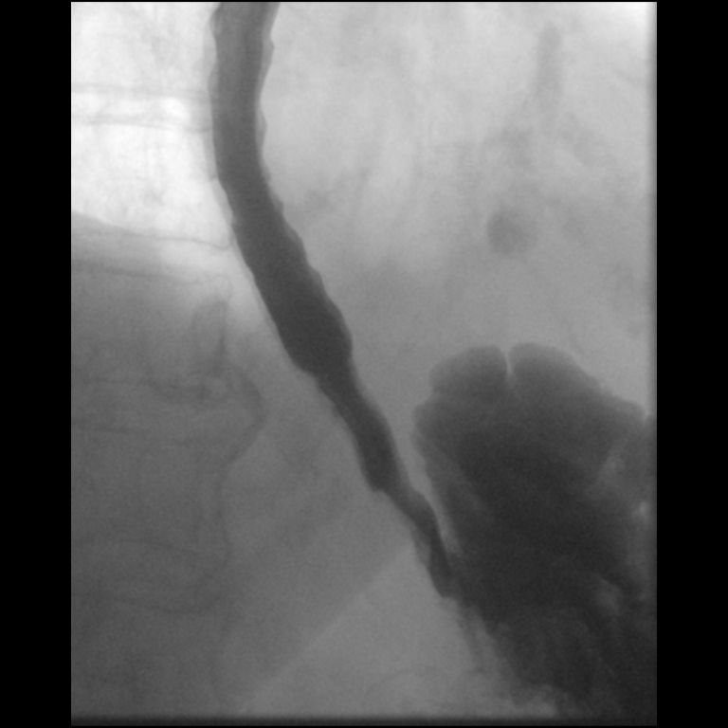

[Series 12: cp_standard · 0.34mm/px · 3 of 18 frames shown (9 of 9)]
[frame 3/18]
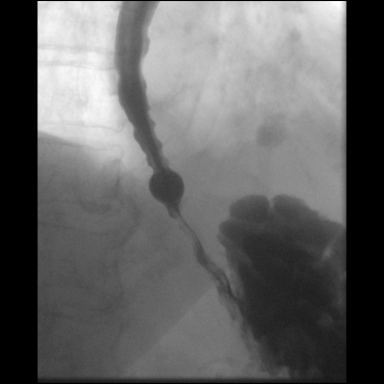
[frame 5/18]
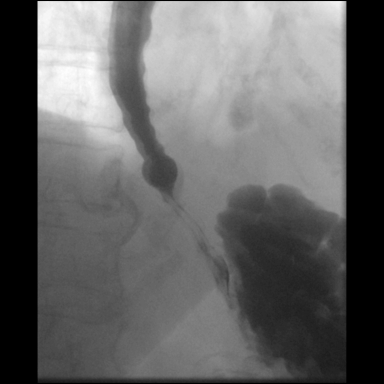
[frame 16/18]
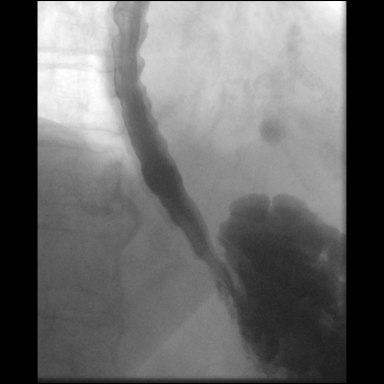

[15 of 21 positions shown; findings below may reference images not displayed]

FINDINGS: The patient was unable to stand, so a single contrast study was
undertaken with the patient inclined at 30 degrees on the
fluoroscopy table.

No obstruction to the forward flow of contrast throughout the
esophagus and into the stomach. Frequent tertiary contractions.

No aspiration was observed.

A 12.5 mm barium tablet was administered and passed freely to the
midesophagus level. After this the tablet passed slowly into the
distal esophagus. It did not pass through the distal most esophagus
or gastroesophageal junction despite several additional swallows of
water, thick barium, and thin barium. However, no discrete stricture
or narrowing in the region is evident (series 12).
IMPRESSION: 1. Presbyesophagus. Decreased esophageal motility and frequent
tertiary contractions.
2. A 12.5 mm barium tablet did not pass freely through the distal
esophagus, however, this seems more related to presbyesophagus and
not to any esophageal narrowing/stricture.
This could be confirmed with a repeat esophagram once the patient
recovers from the acute illness and is able to stand and tolerate a
double contrast study.
3. No aspiration was observed.
